# Patient Record
Sex: Female | Born: 1985
Health system: Southern US, Community
[De-identification: ages and names within clinical notes are randomized; demographics above are authoritative.]

## PROBLEM LIST (undated history)

## (undated) ENCOUNTER — Inpatient Hospital Stay (HOSPITAL_COMMUNITY): Payer: Self-pay

## (undated) DIAGNOSIS — O09899 Supervision of other high risk pregnancies, unspecified trimester: Secondary | ICD-10-CM

## (undated) DIAGNOSIS — O24911 Unspecified diabetes mellitus in pregnancy, first trimester: Secondary | ICD-10-CM

## (undated) DIAGNOSIS — Z3490 Encounter for supervision of normal pregnancy, unspecified, unspecified trimester: Secondary | ICD-10-CM

## (undated) HISTORY — DX: Unspecified diabetes mellitus in pregnancy, first trimester: O24.911

## (undated) HISTORY — PX: NO PAST SURGERIES: SHX2092

## (undated) HISTORY — DX: Encounter for supervision of normal pregnancy, unspecified, unspecified trimester: Z34.90

## (undated) HISTORY — DX: Supervision of other high risk pregnancies, unspecified trimester: O09.899

---
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
## 2010-02-24 ENCOUNTER — Other Ambulatory Visit: Admission: RE | Admit: 2010-02-24 | Discharge: 2010-02-24 | Payer: Self-pay | Admitting: Obstetrics and Gynecology

## 2011-02-26 NOTE — H&P (Signed)
NAMEZIARA, THELANDER             ACCOUNT NO.:  [id number]   MEDICAL RECORD NO.:  [id number]          PATIENT TYPE:  EMS   LOCATION:  MAJO                         FACILITY:  [facility]   PHYSICIAN:  Larina Earthly, M.D.        DATE OF BIRTH:  [birth year]   DATE OF ADMISSION:  09/21/2005  DATE OF DISCHARGE:                                HISTORY & PHYSICAL   CHIEF COMPLAINT:  Left hip abscess.   HISTORY OF PRESENT ILLNESS:  This is an 25 year old Caucasian female who has  been diagnosed with type 1 diabetes since the age of 2 and has been fairly  well-controlled.  She has been using an insulin pump since the age of 38.  She is followed by Dr. Tera Mater. [place] on a regular basis and indeed her  last hemoglobin A1c was 7.3% by her report.  She has never had a history of  DKA since diagnosis of type 1 diabetes.  She has only had two significant  lows in the last 15 years.   She did report a pump site infection approximately one month ago and was  seen by Dr. Evlyn Kanner for which oral antibiotics were prescribed for  approximately one week.  Type of antibiotic is not known at this time.  Infection per the patient's report resolved completely.  However, during the  last one week, the patient noted the development of a large abscess forming  on her left hip.  The patient did not seek medical assistance since she was  in the middle of end of semester exams at [employer] at this  time.  She came home today and was observed by her mother who was quite  alarmed at the time by this abscess on the left hip and she was immediately  brought to the emergency room for further evaluation and management.  In the  emergency room, the physician noted a 15 x 20-cm abscess with large blisters  and subsequently incised and drained the abscess of 400 cc of purulent  material, after initial consultation by general surgery.  It was then packed  with a full bottle of iodoform gauze.  Purulent material was sent  for  culture and vancomycin intravenously was ordered for further management.  I  was called to admit the patient.   REVIEW OF SYSTEMS:  Negative for fevers, chills, significant left hip pain,  nausea, vomiting, chest pain, shortness of breath, new neurological deficit  or complications of type 1 diabetes to include diabetic retinopathy,  nephropathy, and neuropathy.  The patient denied any change in bowel habits  or signs and symptoms of a urinary tract infection.   PROBLEM LIST:  1.  Type 1 diabetes since the age of 2.  2.  Seasonal allergic rhinitis, treated with Zyrtec and Clarinex.   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   She has no known drug allergies.   She currently uses:  1.  Clarinex 5  mg in the morning.  2.  Zyrtec 10 mg in the evening.  3.  Short-acting insulin via insulin pump.   LABORATORY EVALUATION:  Culture pending of the purulent material.  White  cell blood count 13.3, hemoglobin 13.3, hematocrit 38.8%, platelet count  469.  I-STAT-8 pending and unavailable at this time in view of the fact that  the computer system in the emergency room is down for probably an hour.  Creatinine 0.8.   PHYSICAL EXAMINATION:  GENERAL:  A pleasant Caucasian female lying in bed,  alert and oriented x3, answering questions appropriately.  VITAL SIGNS:  Blood pressure 144/88, temperature 99 degrees Fahrenheit,  respirations 16 and non labored, pulse 72.  HEENT:  Sclerae anicteric.  Extraocular movements intact.  There are no  oropharyngeal lesions.  NECK:  Supple.  There is no cervical lymphadenopathy.  LUNGS:  Clear to auscultation bilaterally.  CARDIOVASCULAR:  Reveals a regular rate and rhythm with 1/6 systolic murmur.  ABDOMEN:  Soft, nontender, nondistended.  Active bowel sounds are present.  There is no hepatosplenomegaly.   EXTREMITIES:  Reveals no edema.  Pedal pulses are intact.  Examination of  the left hip reveals a 1.5 x 2-cm incision with iodoform packing __________  out with a minor amount of surrounding erythema and no fluctuance at the  site.  NEUROLOGIC:  Grossly nonfocal.   ASSESSMENT/PLAN:  1.  Left hip abscess presumably secondary to previous insulin pump site      infection thought to be healed.  The patient states that previous course      of antibiotics resulted in complete resolution with no tenderness or      skin breakdown.  This is concerning for an underlying abscess.  We will      follow up on cultures and continue intravenous vancomycin and perform      twice a day iodoform dressing changes, and we will consult surgery if      her clinical course warrants this.  2.  Type 1 diabetes mellitus, fairly well-controlled with the last      hemoglobin A1c being 7.3%.  No significant lows or diabetic ketoacidosis      in her past 15 years.  We will allow the patient to self administer      insulin via bolus and continued her normal basal regimen and check CBG      frequently q.a.c. h.s. and treat as the patient warrants.      Larina Earthly, M.D.  Electronically Signed     RA/MEDQ  D:  09/22/2005  T:  09/22/2005  Job:  [id number]

## 2011-02-26 NOTE — Discharge Summary (Signed)
Lindsey [name], Lindsey [name]             ACCOUNT NO.:  [id number]   MEDICAL RECORD NO.:  [id number]          PATIENT TYPE:  INP   LOCATION:  5702                         FACILITY:  [facility]   PHYSICIAN:  Tera Mater. Evlyn Kanner, M.D. DATE OF BIRTH:  [birth year]   DATE OF ADMISSION:  09/21/2005  DATE OF DISCHARGE:  09/24/2005                                 DISCHARGE SUMMARY   DISCHARGE DIAGNOSES:  1.  Large staphylococcus aureus hip abscess at site of insulin pump      insertion site, now drained and improved.  No evidence of methicillin-      resistant Staphylococcus aureus.  2.  Known type 1 diabetes with chronically well-controlled blood sugars.   PROCEDURE:  She had an incision and drainage in the ER by the emergency room  physician.   CONSULTATIONS:  [facility] team with [place].  Daphine Deutscher, MD on the 13th and they have followed her.  The wound care team has  also been following her and the diabetes care team has also been following  her.   [name] is an 25 year old white female with long history of type 1  diabetes dating back to the age of 2 with no known complications.  She has  been managed for quite some time with an insulin pump since the age of 65.  She has always had good control and noted a pump site problem.  Oral  antibiotics for treatment and it seemed to get better, but there was an  abscess that formed after this and appeared to resolve.  She was seen in the  emergency room and a large amount of pus was drained from an abscess of the  left hip.  The patient was covered broadly with vancomycin in case this was  MRSA or other more aggressive bacteria.  She is doing clinically extremely  well with 2 inch incision with packing and has shown increasing improvement  day by day.  She was placed on contact precautions but fortunately this  bacteria has been now shown to be a more standard staph aureus bacteria.  There is no evidence of that this is oxacillin- or  methicillin-resistant.  The patient has not had a clinical toxicity.  She had no fever, chills and  sweats.  Her blood sugar actually is a bit low today at 55 which she  tolerated well.  She seems to be doing well and ready for discharge.  Surgery team felt like p.o. antibiotics for an additional ten days and  keeping the area covered without packing on discharge was the appropriate  plan of action.  They felt this would close in fairly quickly.   LABORATORY DATA:  At presentation, white count was 13,300, hemoglobin 13.3,  MCV 83.2, platelets 469,000.  Repeat on the 14th, white count 6,200,  hemoglobin 11.9, MCV 83.2, platelets 372,000.  Initial chemistry sodium 137,  potassium 3.6, chloride 106, BUN 12, creatinine 0.8, glucose 191.  Repeat on  14th, sodium 141, potassium 3.6, chloride 108, CO2 27, BUN 5, creatinine  0.7, glucose 104, calcium 8.8.  The bacteria was staph aureus  in a moderate  amount.  Its sensitivities were sensitive to gentamicin, to levofloxacin, to  oxacillin, to rifampin, trimethoprim sulfa, vancomycin, tetracycline,  moxifloxacin.  Its only resistances are to erythromycin and penicillin.   In summary, we have an 25 year old white female with longstanding well-  controlled type 1 diabetes presenting  with a pump site infection that  developed into an abscess.  She has done well with incision and drainage,  broad-spectrum antibiotics and local wound care.  She is now ready to be  transitioned to oral antibiotics and I believe we  are going to get by with using Levaquin 750 daily for an additional ten  days.  She should do extremely well with this.  The wound is improving.  She  will resume her diabetes pump settings as before, her diet as before, no  pain management is necessary.  Antibiotics have been called in to [facility]  at [place] at [phone].           ______________________________  Tera Mater. Evlyn Kanner, M.D.     SAS/MEDQ  D:  09/24/2005  T:  09/27/2005   Job:  [id number]   cc:   [place] Daphine Deutscher, MD  [address]  [place]  [place] [address]

## 2011-08-27 DIAGNOSIS — E109 Type 1 diabetes mellitus without complications: Secondary | ICD-10-CM | POA: Insufficient documentation

## 2011-08-27 DIAGNOSIS — E1065 Type 1 diabetes mellitus with hyperglycemia: Secondary | ICD-10-CM | POA: Insufficient documentation

## 2012-01-08 ENCOUNTER — Emergency Department (HOSPITAL_COMMUNITY): Payer: BC Managed Care – PPO

## 2012-01-08 ENCOUNTER — Encounter (HOSPITAL_COMMUNITY): Payer: Self-pay | Admitting: Emergency Medicine

## 2012-01-08 ENCOUNTER — Emergency Department (HOSPITAL_COMMUNITY)
Admission: EM | Admit: 2012-01-08 | Discharge: 2012-01-08 | Disposition: A | Discharge status: Home / Self Care | Payer: BC Managed Care – PPO | Attending: Emergency Medicine | Admitting: Emergency Medicine

## 2012-01-08 DIAGNOSIS — H5710 Ocular pain, unspecified eye: Secondary | ICD-10-CM | POA: Insufficient documentation

## 2012-01-08 DIAGNOSIS — R29898 Other symptoms and signs involving the musculoskeletal system: Secondary | ICD-10-CM | POA: Insufficient documentation

## 2012-01-08 DIAGNOSIS — F29 Unspecified psychosis not due to a substance or known physiological condition: Secondary | ICD-10-CM | POA: Insufficient documentation

## 2012-01-08 DIAGNOSIS — Z794 Long term (current) use of insulin: Secondary | ICD-10-CM | POA: Insufficient documentation

## 2012-01-08 DIAGNOSIS — R109 Unspecified abdominal pain: Secondary | ICD-10-CM | POA: Insufficient documentation

## 2012-01-08 DIAGNOSIS — R209 Unspecified disturbances of skin sensation: Secondary | ICD-10-CM | POA: Insufficient documentation

## 2012-01-08 DIAGNOSIS — R51 Headache: Secondary | ICD-10-CM

## 2012-01-08 DIAGNOSIS — M25519 Pain in unspecified shoulder: Secondary | ICD-10-CM | POA: Insufficient documentation

## 2012-01-08 DIAGNOSIS — E119 Type 2 diabetes mellitus without complications: Secondary | ICD-10-CM | POA: Insufficient documentation

## 2012-01-08 DIAGNOSIS — R202 Paresthesia of skin: Secondary | ICD-10-CM

## 2012-01-08 LAB — DIFFERENTIAL
Basophils Absolute: 0 10*3/uL (ref 0.0–0.1)
Basophils Relative: 0 % (ref 0–1)
Eosinophils Absolute: 0.3 10*3/uL (ref 0.0–0.7)
Eosinophils Relative: 4 % (ref 0–5)
Lymphocytes Relative: 25 % (ref 12–46)
Lymphs Abs: 1.6 10*3/uL (ref 0.7–4.0)
Monocytes Absolute: 0.5 10*3/uL (ref 0.1–1.0)
Monocytes Relative: 7 % (ref 3–12)
Neutro Abs: 4.2 10*3/uL (ref 1.7–7.7)
Neutrophils Relative %: 65 % (ref 43–77)

## 2012-01-08 LAB — POCT I-STAT, CHEM 8
BUN: 5 mg/dL — ABNORMAL LOW (ref 6–23)
Calcium, Ion: 1.24 mmol/L (ref 1.12–1.32)
Chloride: 107 mEq/L (ref 96–112)
Creatinine, Ser: 0.8 mg/dL (ref 0.50–1.10)
Glucose, Bld: 91 mg/dL (ref 70–99)
HCT: 45 % (ref 36.0–46.0)
Hemoglobin: 15.3 g/dL — ABNORMAL HIGH (ref 12.0–15.0)
Potassium: 3.9 mEq/L (ref 3.5–5.1)
Sodium: 143 mEq/L (ref 135–145)
TCO2: 25 mmol/L (ref 0–100)

## 2012-01-08 LAB — CBC
HCT: 41.2 % (ref 36.0–46.0)
Hemoglobin: 13.6 g/dL (ref 12.0–15.0)
MCH: 28.3 pg (ref 26.0–34.0)
MCHC: 33 g/dL (ref 30.0–36.0)
MCV: 85.8 fL (ref 78.0–100.0)
Platelets: 336 10*3/uL (ref 150–400)
RBC: 4.8 MIL/uL (ref 3.87–5.11)
RDW: 12.4 % (ref 11.5–15.5)
WBC: 6.6 10*3/uL (ref 4.0–10.5)

## 2012-01-08 LAB — URINE MICROSCOPIC-ADD ON

## 2012-01-08 LAB — URINALYSIS, ROUTINE W REFLEX MICROSCOPIC
Bilirubin Urine: NEGATIVE
Glucose, UA: NEGATIVE mg/dL
Ketones, ur: NEGATIVE mg/dL
Leukocytes, UA: NEGATIVE
Nitrite: NEGATIVE
Protein, ur: NEGATIVE mg/dL
Specific Gravity, Urine: 1.015 (ref 1.005–1.030)
Urobilinogen, UA: 0.2 mg/dL (ref 0.0–1.0)
pH: 8 (ref 5.0–8.0)

## 2012-01-08 LAB — PREGNANCY, URINE: Preg Test, Ur: NEGATIVE

## 2012-01-08 MED ORDER — TETRACAINE HCL 0.5 % OP SOLN
OPHTHALMIC | Status: AC
  Administered 2012-01-08: 17:00:00
  Filled 2012-01-08: qty 2

## 2012-01-08 MED ORDER — GADOBENATE DIMEGLUMINE 529 MG/ML IV SOLN
17.0000 mL | Freq: Once | INTRAVENOUS | Status: AC | PRN
  Administered 2012-01-08: 17 mL via INTRAVENOUS

## 2012-01-08 NOTE — ED Notes (Signed)
Pt. Stated, when i was trying to cut my movie off and I couldn't get my hand to do what i wanted it to do.  Today I'm having pain behind my rt. Eye and I think i might have had a seizure.

## 2012-01-08 NOTE — ED Provider Notes (Signed)
History     CSN: [id number]  Arrival date & time 01/08/12  1436   First MD Initiated Contact with Patient 01/08/12 1657      Chief Complaint  Patient presents with  . Eye Pain    pain behind rt. eye    (Consider location/radiation/quality/duration/timing/severity/associated sxs/prior treatment) Patient is a 26 y.o. female presenting with eye pain. The history is provided by the patient.  Eye Pain The current episode started today. The problem occurs constantly. The problem has been unchanged. Associated symptoms include headaches, numbness and weakness. Pertinent negatives include no chills or fever. The symptoms are aggravated by nothing. She has tried nothing for the symptoms.  Pt states right eye pressure since this morning. States hurts right behind the eye. No injury to the eye, no visual changes, no sensitivity to light or sound, no eye redness or swelling. States also having some tenderness in right shoulder and pain in right flank. States that last night she had an episode where she fell asleep while watching a move on her tablet in bed. States woke up and tried to turn her tablet off, when she realized she could not move her right arm. States she also felt like she was confused, could not think clear, and possibly couldn't move her right leg, but she is not sure. States she threw the tablet with her left hand and had to "throw" her body over onto right side and then she went back to sleep. This morning when woke up, those symptoms resolved. She denies previous similar episodes, denies recent illnesses.  Past Medical History  Diagnosis Date  . Diabetes mellitus     History reviewed. No pertinent past surgical history.  History reviewed. No pertinent family history.  History  Substance Use Topics  . Smoking status: Never Smoker   . Smokeless tobacco: Not on file  . Alcohol Use: 1.2 oz/week    1 Glasses of wine, 1 Shots of liquor per week    OB History    Grav Para Term  Preterm Abortions TAB SAB Ect Mult Living                  Review of Systems  Constitutional: Negative for fever and chills.  Eyes: Positive for pain. Negative for photophobia, discharge, redness, itching and visual disturbance.  Respiratory: Negative.   Cardiovascular: Negative.   Gastrointestinal: Negative.   Genitourinary: Positive for flank pain. Negative for dysuria and hematuria.  Musculoskeletal: Negative.   Skin: Negative.   Neurological: Positive for weakness, numbness and headaches. Negative for dizziness, syncope, facial asymmetry and speech difficulty.  Psychiatric/Behavioral: Negative.     Allergies  Review of patient's allergies indicates no known allergies.  Home Medications   Current Outpatient Rx  Name Route Sig Dispense Refill  . INSULIN ASPART 100 UNIT/ML [facility] SOLN Subcutaneous Inject 0-10 Units into the skin 3 (three) times daily before meals. With pump/ sugar level minus 100 divided by 40; total carbs divided 11-13 - average 33.5 units/day      BP 119/80  Pulse 75  Temp(Src) 98.8 F (37.1 C) (Oral)  Resp 20  SpO2 100%  LMP 01/01/2012  Physical Exam  Nursing note and vitals reviewed. Constitutional: She is oriented to person, place, and time. She appears well-developed and well-nourished.  HENT:  Head: Normocephalic and atraumatic.  Right Ear: External ear normal.  Left Ear: External ear normal.  Nose: Nose normal.  Mouth/Throat: Oropharynx is clear and moist.  Eyes: EOM are normal. Pupils are  equal, round, and reactive to light. Right eye exhibits no discharge. Left eye exhibits no discharge.  Neck: Normal range of motion. Neck supple.  Cardiovascular: Normal rate, regular rhythm and normal heart sounds.   Pulmonary/Chest: Effort normal and breath sounds normal. No respiratory distress.  Abdominal: Soft. Bowel sounds are normal.  Musculoskeletal: Normal range of motion.  Lymphadenopathy:    She has no cervical adenopathy.  Neurological: She is  alert and oriented to person, place, and time. She displays normal reflexes. No cranial nerve deficit. She exhibits normal muscle tone. Coordination normal.       5/5 and equal upper and lower extremity strength bilaterally. No pronator drift. Normal finger to nose.   Skin: Skin is warm and dry.  Psychiatric: She has a normal mood and affect.    ED Course  Procedures (including critical care time)  Pt with left sided paralysis last night, headache today. Will check istat chem 8, eye pressures, MRI brain to r/o stroke/ tumor.  Results for orders placed during the hospital encounter of 01/08/12  CBC      Component Value Range   WBC 6.6  4.0 - 10.5 (K/uL)   RBC 4.80  3.87 - 5.11 (MIL/uL)   Hemoglobin 13.6  12.0 - 15.0 (g/dL)   HCT 29.5  62.1 - 30.8 (%)   MCV 85.8  78.0 - 100.0 (fL)   MCH 28.3  26.0 - 34.0 (pg)   MCHC 33.0  30.0 - 36.0 (g/dL)   RDW 65.7  84.6 - 96.2 (%)   Platelets 336  150 - 400 (K/uL)  DIFFERENTIAL      Component Value Range   Neutrophils Relative 65  43 - 77 (%)   Neutro Abs 4.2  1.7 - 7.7 (K/uL)   Lymphocytes Relative 25  12 - 46 (%)   Lymphs Abs 1.6  0.7 - 4.0 (K/uL)   Monocytes Relative 7  3 - 12 (%)   Monocytes Absolute 0.5  0.1 - 1.0 (K/uL)   Eosinophils Relative 4  0 - 5 (%)   Eosinophils Absolute 0.3  0.0 - 0.7 (K/uL)   Basophils Relative 0  0 - 1 (%)   Basophils Absolute 0.0  0.0 - 0.1 (K/uL)  URINALYSIS, ROUTINE W REFLEX MICROSCOPIC      Component Value Range   Color, Urine YELLOW  YELLOW    APPearance CLEAR  CLEAR    Specific Gravity, Urine 1.015  1.005 - 1.030    pH 8.0  5.0 - 8.0    Glucose, UA NEGATIVE  NEGATIVE (mg/dL)   Hgb urine dipstick MODERATE (*) NEGATIVE    Bilirubin Urine NEGATIVE  NEGATIVE    Ketones, ur NEGATIVE  NEGATIVE (mg/dL)   Protein, ur NEGATIVE  NEGATIVE (mg/dL)   Urobilinogen, UA 0.2  0.0 - 1.0 (mg/dL)   Nitrite NEGATIVE  NEGATIVE    Leukocytes, UA NEGATIVE  NEGATIVE   PREGNANCY, URINE      Component Value Range    Preg Test, Ur NEGATIVE  NEGATIVE   POCT I-STAT, CHEM 8      Component Value Range   Sodium 143  135 - 145 (mEq/L)   Potassium 3.9  3.5 - 5.1 (mEq/L)   Chloride 107  96 - 112 (mEq/L)   BUN 5 (*) 6 - 23 (mg/dL)   Creatinine, Ser 9.52  0.50 - 1.10 (mg/dL)   Glucose, Bld 91  70 - 99 (mg/dL)   Calcium, Ion 8.41  3.24 - 1.32 (mmol/L)   TCO2  25  0 - 100 (mmol/L)   Hemoglobin 15.3 (*) 12.0 - 15.0 (g/dL)   HCT 86.5  78.4 - 69.6 (%)  URINE MICROSCOPIC-ADD ON      Component Value Range   Squamous Epithelial / LPF FEW (*) RARE    WBC, UA 3-6  <3 (WBC/hpf)   RBC / HPF 3-6  <3 (RBC/hpf)   Bacteria, UA RARE  RARE    Mr Laqueta Jean Wo Contrast  01/08/2012  *RADIOLOGY REPORT*  Clinical Data: 26 year old female with headache and transient right- sided upper extremity weakness. Chronic diabetes.  Insulin pump user.  MRI HEAD WITHOUT AND WITH CONTRAST  Technique:  Multiplanar, multiecho pulse sequences of the brain and surrounding structures were obtained according to standard protocol without and with intravenous contrast  Contrast: 17mL MULTIHANCE GADOBENATE DIMEGLUMINE 529 MG/ML IV SOLN  Comparison: None.  Findings: Normal cerebral volume. No restricted diffusion to suggest acute infarction.  No midline shift, mass effect, evidence of mass lesion, ventriculomegaly, extra-axial collection or acute intracranial hemorrhage.  Cervicomedullary junction and pituitary are within normal limits.  Major intracranial vascular flow voids are preserved. Wallace Cullens and white matter signal is within normal limits throughout the brain.  Negative visualized cervical spine. Visualized bone marrow signal is within normal limits. No abnormal enhancement identified.  Visualized orbit soft tissues are within normal limits.  Mild paranasal sinus mucosal thickening/small mucous retention cyst. Mastoids are clear.  Negative scalp soft tissues.  IMPRESSION: Normal MRI appearance of the brain.  Original Report Authenticated By: Harley Hallmark, M.D.     7:29 PM Work up is negative including MR of the brain. Pt symptom free at present. Her right eye pressure is 18. Will d/c home with follow up with PCP. Possible sleep paralysis? Vs TIA   No diagnosis found.    MDM          Lottie Mussel, PA 01/09/12 [phone]

## 2012-01-08 NOTE — ED Notes (Signed)
Denies any need or question following dc instructions

## 2012-01-08 NOTE — Discharge Instructions (Signed)
Your labs, and MRI were all normal today. Take tylenol or motrin for headache/pain. Follow up with primary care doctor or neurology if symptoms continue. Return if worsening.

## 2012-01-16 NOTE — ED Provider Notes (Signed)
Evaluation and management procedures were performed by the PA/NP/Resident Physician under my supervision/collaboration.   Domenica Weightman D Khamya Topp, MD 01/16/12 1559 

## 2012-06-16 ENCOUNTER — Other Ambulatory Visit (HOSPITAL_COMMUNITY)
Admission: RE | Admit: 2012-06-16 | Discharge: 2012-06-16 | Disposition: A | Discharge status: Home / Self Care | Payer: BC Managed Care – PPO | Source: Ambulatory Visit | Attending: Obstetrics and Gynecology | Admitting: Obstetrics and Gynecology

## 2012-06-16 DIAGNOSIS — Z01419 Encounter for gynecological examination (general) (routine) without abnormal findings: Secondary | ICD-10-CM | POA: Insufficient documentation

## 2012-08-17 DIAGNOSIS — E11649 Type 2 diabetes mellitus with hypoglycemia without coma: Secondary | ICD-10-CM | POA: Insufficient documentation

## 2012-08-17 DIAGNOSIS — Z9641 Presence of insulin pump (external) (internal): Secondary | ICD-10-CM | POA: Insufficient documentation

## 2012-08-17 DIAGNOSIS — O24019 Pre-existing diabetes mellitus, type 1, in pregnancy, unspecified trimester: Secondary | ICD-10-CM | POA: Insufficient documentation

## 2012-08-17 DIAGNOSIS — E109 Type 1 diabetes mellitus without complications: Secondary | ICD-10-CM | POA: Insufficient documentation

## 2012-12-19 ENCOUNTER — Encounter: Payer: Self-pay | Admitting: *Deleted

## 2013-02-08 ENCOUNTER — Other Ambulatory Visit: Payer: Self-pay | Admitting: Adult Health

## 2014-11-16 ENCOUNTER — Encounter: Payer: 59 | Attending: Internal Medicine

## 2014-11-16 DIAGNOSIS — Z713 Dietary counseling and surveillance: Secondary | ICD-10-CM | POA: Insufficient documentation

## 2014-11-16 DIAGNOSIS — E109 Type 1 diabetes mellitus without complications: Secondary | ICD-10-CM | POA: Insufficient documentation

## 2014-11-16 DIAGNOSIS — Z794 Long term (current) use of insulin: Secondary | ICD-10-CM | POA: Insufficient documentation

## 2014-11-20 ENCOUNTER — Encounter: Payer: 59 | Admitting: *Deleted

## 2014-11-20 ENCOUNTER — Encounter: Payer: Self-pay | Admitting: *Deleted

## 2014-11-20 VITALS — Ht 64.0 in | Wt 166.4 lb

## 2014-11-20 DIAGNOSIS — Z713 Dietary counseling and surveillance: Secondary | ICD-10-CM | POA: Diagnosis not present

## 2014-11-20 DIAGNOSIS — E109 Type 1 diabetes mellitus without complications: Secondary | ICD-10-CM | POA: Diagnosis not present

## 2014-11-20 DIAGNOSIS — Z794 Long term (current) use of insulin: Secondary | ICD-10-CM | POA: Diagnosis not present

## 2014-11-20 NOTE — Patient Instructions (Signed)
Plan: Consider using Carb Counting by Food group as back up plan for carb counting when gram information is not available  Consider use of Calorie King APP on your phone as additional source of gram information You have asked me to contact the [facility] Clinical Manager to see what pump and CGM options might be available so you can have a separate device to program boluses without pulling your pump out of your clothing. You will be hearing from her soon.  Continue using the Bolus Wizard to give insulin for meals and correction at each meal. Consider options for increasing your activity level either at home with your family or in classes at work. (there is a shower [facility]

## 2014-11-20 NOTE — Progress Notes (Signed)
Diabetes Self-Management Education  Visit Type:  Initial  Appt. Start Time: 1145 Appt. End Time: 1215  11/20/2014  [name], identified by name and date of birth, is a 29 y.o. female with a diagnosis of Diabetes: Type 1.  Other people present during visit:  Patient   ASSESSMENT  Height  (1.626 m), weight 166 lb 6.4 oz (75.479 kg). Body mass index is 28.55 kg/(m^2).  Initial Visit Information:  Are you currently following a meal plan?: Yes What type of meal plan do you follow?: limited sugar and flour Are you taking your medications as prescribed?: Yes Are you checking your feet?: No        Psychosocial:     Patient Belief/Attitude about Diabetes: Motivated to manage diabetes Self-care barriers: None Other persons present: Patient Patient Concerns: Nutrition/Meal planning, Medication, Other (comment) (insulin pump support) Special Needs: None Learning Readiness: Change in progress  Complications:   Last HgB A1C per patient/outside source: 6.9 % How often do you check your blood sugar?: 3-4 times/day Have you had a dilated eye exam in the past 12 months?: No Have you had a dental exam in the past 12 months?: No  Diet Intake:  Breakfast: Protein Bar, occasionally fresh fruit, coffee with sweetener Snack (morning): occasionally nuts Lunch: varies daily: meat and cheese with fresh ftuit or whatever is available Snack (afternoon): protein bar Dinner: meat, 2 vegetables, occasionally a sweet potato, water OR fast food with chicken tenders and fries, diet Dr. Reino Kent Snack (evening): no Beverage(s): coffee, hot tea, diet soda, water  Exercise:  Exercise: ADL's (plays with kids in yard)  Individualized Plan for Diabetes Self-Management Training:   Learning Objective:  Patient will have a greater understanding of diabetes self-management.  Patient education plan per assessed needs and concerns is to attend individual sessions    Education Topics  Reviewed with Patient Today:    Role of diet in the treatment of diabetes and the relationship between the three main macronutrients and blood glucose level, Carbohydrate counting Helped patient identify appropriate exercises in relation to his/her diabetes, diabetes complications and other health issue. Other (comment) (Reviewed extended bolus options for higher fat meals)       Other (comment) (Provided flyer for DM 1 Support Group)      PATIENTS GOALS/Plan (Developed by the patient):  Nutrition: General guidelines for healthy choices and portions discussed Physical Activity: 15 minutes per day Monitoring : test blood glucose pre and post meals as discussed  Plan:   Patient Instructions  Plan: Consider using Carb Counting by Food group as back up plan for carb counting when gram information is not available  Consider use of Calorie King APP on your phone as additional source of gram information You have asked me to contact the [facility] Clinical Manager to see what pump and CGM options might be available so you can have a separate device to program boluses without pulling your pump out of your clothing. You will be hearing from her soon.  Continue using the Bolus Wizard to give insulin for meals and correction at each meal. Consider options for increasing your activity level either at home with your family or in classes at work. (there is a shower located at the new fitness center at [facility]   Expected Outcomes:  Demonstrated interest in learning. Expect positive outcomes  Education material provided: Meal plan card and Carbohydrate counting sheet, DM 1 Support Group Flyer, [facility] and Medtronic Pump and CGM Packets  If problems or  questions, patient to contact team via:  Phone and Email  Future DSME appointment: PRN

## 2015-01-01 ENCOUNTER — Ambulatory Visit: Payer: Self-pay | Admitting: *Deleted

## 2015-01-01 ENCOUNTER — Encounter: Payer: 59 | Attending: Internal Medicine | Admitting: *Deleted

## 2015-01-01 DIAGNOSIS — Z794 Long term (current) use of insulin: Secondary | ICD-10-CM | POA: Insufficient documentation

## 2015-01-01 DIAGNOSIS — Z713 Dietary counseling and surveillance: Secondary | ICD-10-CM | POA: Insufficient documentation

## 2015-01-01 DIAGNOSIS — E109 Type 1 diabetes mellitus without complications: Secondary | ICD-10-CM | POA: Insufficient documentation

## 2015-01-01 NOTE — Progress Notes (Signed)
CGM Training:  Appt start time: 1000 end time:1130.  Assessment:  Primary concerns today: Patient here for initiation of Medtronic Revel pump with Enlite Continuous Glucose Monitoring.   Medications: see list     Intervention:   Understanding Glucose Sensing Programming Sensor Information  High Glucose: No Alert today  Low Glucose No Alert today   Other settings to be added at follow up visit Starting New Sensor Use of Product  Entering BG, Calibration Technique and Graphs with [employer] Care[employer] Software and Reports Basic Care Troubleshooting Site and Alarms   Follow Up Patient to see MD for insulin dose adjustments and to schedule visit with me for CGM follow up within 2 week(s).

## 2015-01-15 ENCOUNTER — Encounter: Payer: 59 | Attending: Internal Medicine | Admitting: *Deleted

## 2015-01-15 DIAGNOSIS — Z794 Long term (current) use of insulin: Secondary | ICD-10-CM | POA: Diagnosis not present

## 2015-01-15 DIAGNOSIS — Z713 Dietary counseling and surveillance: Secondary | ICD-10-CM | POA: Insufficient documentation

## 2015-01-15 DIAGNOSIS — E109 Type 1 diabetes mellitus without complications: Secondary | ICD-10-CM | POA: Diagnosis not present

## 2015-01-15 NOTE — Patient Instructions (Signed)
Plan: We have adjusted your Basal Rates down overnight to address hypoglycemia overnight and early AM We have reduced your Carb Ratio at [place] for same reason Consider Filling your [place] with 0.3 units when you change out your infusion set each time Consider using Capture Events for Protein Bar and [place] food so we can assess via the reports what will work better for you Consider using Capture Event for Exercise pre and post again to assess on reports

## 2015-01-17 NOTE — Progress Notes (Signed)
CGM Follow Up Visit:  Appt start time: 1000 end time:1100.  Assessment:  Primary concerns today: Patient here for follow up for Continuous Glucose Monitoring. She states she enjoys having the data, she feels she looks at it too much, wants to back off a bit. She does not feel the need to turn on any alerts at this time.  Her MD has given me permission to make insulin pump setting adjustments.  Medications: see list     Intervention:  We reviewed her CareLink Pro Reports today. The excursion at 2 PM on one day was a [place] Meal which we discussed at our meeting today.      She was having frequent hypoglycemia the first week on the CGM so I recommended she decrease her Basal Rates from 24 hour range of 1.10 - 1.40 u/hr to a single basal rate of 1.00 u/hr on 01/09/15 and the hypoglycemia improved. These reports reflect the averages of each week prior to and after this change was made. Today, we noticed that her post supper glucose has been below target ranges so we decreased her ICR at supper from 14 - 16 grams. I also taught her to use Capture Events to document her exercise times, Protein Bar use and [place] Meals so we can determine what Advanced Features might be helpful to control these situations better.  Pump Settings: Date: Current Date: 01/15/15     [facility] in [facility]   Basal Rate: Carb Ratio Sensitivity  Basal Rate: Carb Ratio Sensitivity   MN: 1.00 MN: 12 MN: 45 MN: 1.00 MN: 12 MN: 45    11A: 14 9 P: 50   11A: 14 9 P: 50     5 P: 14    5 P: 12  (-)                                         Follow Up Patient to upload to CareLink as needed and contact me for report review each time.  CGM follow up in 8 week(s).

## 2015-02-27 ENCOUNTER — Ambulatory Visit (INDEPENDENT_AMBULATORY_CARE_PROVIDER_SITE_OTHER): Payer: Self-pay | Admitting: Family Medicine

## 2015-02-27 ENCOUNTER — Ambulatory Visit: Payer: 59 | Admitting: Pharmacist

## 2015-02-27 VITALS — BP 116/68 | Ht 64.0 in | Wt 156.0 lb

## 2015-02-27 DIAGNOSIS — E109 Type 1 diabetes mellitus without complications: Secondary | ICD-10-CM

## 2015-02-27 NOTE — Progress Notes (Signed)
Subjective:  Patient is a 29 yo female with type 1 diabetes who presents today for annual pharmacy consult as part of the employer-sponsored Link to Wellness program. Current diabetes regimen includes Humalog via pump.  ASA, ACEi, and statin are not yet indicated.  Pt demonstrates a good understanding of her insulin regimen.  Most recent MD follow-up was late  Feb 2016. Patient has a pending appt for 3 months. No med changes or major health changes at this time.  Diabetes Assessment:  No changes to diabetes regimen at this time. Patient continues using Medtronic pump and uses an average of humalog 30 units per day.  Patient does maintain good medication compliance. Most recent A1c was 7.2% which is exceeding goal of less than 7%.  Weight has decreased by 40 lb over the past year according to patient report.  Patient did bring meter today and is currently testing 1-6 times per day. Glucose monitoring occurs fasting, before meals, and when symptomatic.  Patient is not currently wearing a CGM but recently purchased the newest medtronic CGM and did wear it for several weeks.  During this time patient was checking glucose often and using CGM to identify trends.  She did appreciate the amount of information she was receiving and felt it helped her better manage her diabetes.   Patient denies signs and symptoms of neuropathy including numbness/tingling/burning and symptoms of foot infection.  Patient is not up to date on eye and dental exam and is aware of the need to schedule an appt.    Lifestyle Assessment:  Diet - Patient feels her diet is under good control, she has lost 40 lbs over previous year as a result of dietary changes and portion control.  Exercise - No routine exercise.  She does [facility].  She is active with her 28 year old child.      Plan and Goals: 1)  Attempt to achieve a reduced A1c by next Link to Wellness visit 2)  Attempt to test glucose  fasting and prior to each meal (3-4 times daily) 3)  Continue to make healthy dietary choices, and attempt to eat more regular meals to avoid overeating 4)  Stay as active as possible, will consider setting an achievable goal at a future date 5)  Find an eye doctor in the area and schedule an exam   Orlin HildingElizabeth Florencia Zaccaro, PharmD [facility]  [phone]

## 2015-02-27 NOTE — Patient Instructions (Signed)
1)  Attempt to achieve a reduced A1c by next Link to Wellness visit 2)  Attempt to test glucose fasting and prior to each meal (3-4 times daily) 3)  Continue to make healthy dietary choices, and attempt to eat more regular meals to avoid overeating 4)  Stay as active as possible, will consider setting an achievable goal at a future date 5)  Find an eye doctor in the area and schedule an exam  Lindsey LundJanet will contact you for follow-up appointment.  It was great to meet you today!

## 2015-03-06 NOTE — Progress Notes (Signed)
ATTENDING PHYSICIAN NOTE: I have reviewed the chart and agree with the plan as detailed above. Sara Neal MD Pager [phone]  

## 2015-03-19 ENCOUNTER — Ambulatory Visit: Payer: 59 | Admitting: *Deleted

## 2015-04-07 ENCOUNTER — Other Ambulatory Visit: Payer: Self-pay

## 2015-06-19 ENCOUNTER — Ambulatory Visit: Payer: 59 | Admitting: *Deleted

## 2015-06-26 ENCOUNTER — Encounter: Payer: 59 | Attending: Internal Medicine | Admitting: *Deleted

## 2015-06-26 ENCOUNTER — Encounter: Payer: Self-pay | Admitting: *Deleted

## 2015-06-26 DIAGNOSIS — E109 Type 1 diabetes mellitus without complications: Secondary | ICD-10-CM | POA: Insufficient documentation

## 2015-06-26 DIAGNOSIS — Z713 Dietary counseling and surveillance: Secondary | ICD-10-CM | POA: Insufficient documentation

## 2015-07-03 NOTE — Progress Notes (Signed)
Diabetes Self-Management Education  Visit Type:  Follow-up  Appt. Start Time: 1600 Appt. End Time: 1700  07/03/2015  [name], identified by name and date of birth, is a 29 y.o. female with a diagnosis of Diabetes:  .   ASSESSMENT  There were no vitals taken for this visit. There is no weight on file to calculate BMI.       Diabetes Self-Management Education - 07/03/15 1447    Psychosocial Assessment   Patient Belief/Attitude about Diabetes Motivated to manage diabetes   Self-care barriers None   Self-management support CDE visits;Doctor's office   Patient Concerns Glycemic Control;Nutrition/Meal planning   Special Needs None   Complications   Last HgB A1C per patient/outside source 8 %   How often do you check your blood sugar? 3-4 times/day   Patient Education   Previous Diabetes Education Yes (please comment)  Lindsey [name]   Monitoring Identified appropriate SMBG and/or A1C goals.   Psychosocial adjustment Role of stress on diabetes;Brainstormed with patient on coping mechanisms for social situations, getting support from significant others, dealing with feelings about diabetes  Informed patient of DM 1 Support Group   Individualized Goals (developed by patient)   Nutrition Adjust meds/carbs with exercise as discussed   Physical Activity Exercise 3-5 times per week   Medications take my medication as prescribed   Monitoring  test blood glucose pre and post meals as discussed   Health Coping Other (comment)  DM 1 Support Group   Patient Self-Evaluation of Goals - Patient rates self as meeting previously set goals (% of time)   Nutrition >75%   Physical Activity 50 - 75 %   Medications >75%   Monitoring 50 - 75 %   Problem Solving 50 - 75 %   Reducing Risk >75%   Health Coping 50 - 75 %   Outcomes   Program Status Not Completed   Subsequent Visit   Since your last visit have you continued or begun to take your medications as prescribed? Yes   Since your  last visit, are you checking your blood glucose at least once a day? Yes      Learning Objective:  Patient will have a greater understanding of diabetes self-management. Patient education plan is to attend individual and/or group sessions per assessed needs and concerns.   Plan:   Patient Instructions  Plan: Continue with Carb Counting for use with insulin pump Continue to SMBG before and occasionally after meals to assess accuracy of insulin sensitivity factor Consider attending DM 1 Support Group to help with coping and social support as well as ongoing education    Expected Outcomes:  Demonstrated interest in learning. Expect positive outcomes  Education material provided: DM 1 Support Group Flyer  If problems or questions, patient to contact team via:  Phone and Email  Future DSME appointment: - 3-4 months

## 2015-07-03 NOTE — Patient Instructions (Signed)
Plan: Continue with Carb Counting for use with insulin pump Continue to SMBG before and occasionally after meals to assess accuracy of insulin sensitivity factor Consider attending DM 1 Support Group to help with coping and social support as well as ongoing education

## 2015-07-29 ENCOUNTER — Ambulatory Visit: Payer: 59 | Admitting: *Deleted

## 2015-08-05 ENCOUNTER — Ambulatory Visit: Payer: 59 | Admitting: *Deleted

## 2015-08-05 ENCOUNTER — Encounter: Payer: 59 | Attending: Internal Medicine | Admitting: *Deleted

## 2015-08-05 DIAGNOSIS — Z713 Dietary counseling and surveillance: Secondary | ICD-10-CM | POA: Insufficient documentation

## 2015-08-05 DIAGNOSIS — E109 Type 1 diabetes mellitus without complications: Secondary | ICD-10-CM | POA: Insufficient documentation

## 2015-08-12 NOTE — Progress Notes (Signed)
Pump Upgrade Training: Appt start time: 1530 end time: 1600.   Assessment: Primary concerns today: Patient here for upgrade to Medtronic 630G with CGM  insulin pump. Patient has or has not completed training modules from [employer]. She has also started wearing this pump. Today we will review her settings and go over Advanced Features   Medications: see list. Insulin used in pump is Humalog  Intervention:  Pump settings transferred directly from current pump to new pump by patient New meter ID added to pump for communicating Reviewed new features of pump with patient Suggested use of CGM, Temp Basal and Extended Bolus features to patient in order to improve overall control Patient  is signed up on [employer] Web Site. She will provide me with her User Name and Password when she gets back home Patient expressed understanding of info taught and is wearing pump by end of visit.  Follow Up   Patient to upload to CareLink within 7 days and notify me to assess for any pump setting changes.  CGM Training:  Appt start time: 1600 end time:1700.  Assessment:  Primary concerns today: Patient here for initiation of Medtronic 630G Continuous Glucose Monitoring.   Medications: see list     Intervention:   Understanding Glucose Sensing Programming Sensor Information  High Glucose: 250 mg/dl during the day and 454300 mg/dl during her sleep time  Low Glucose OFF  Other settings to be added at follow up visit Starting [employer] Sensor Use of Product  Entering BG, Calibration Technique and Graphs with [employer] CareLink Software and Reports Basic [employer] Troubleshooting Site and Alarms  Follow Up Patient to see MD for insulin dose adjustments and to schedule visit with me for CGM follow up within 4 week(s). Patient informed of DM 1 and Pump Support Group meetings the 1st Thursday of every month.

## 2015-08-13 ENCOUNTER — Ambulatory Visit: Payer: 59 | Admitting: *Deleted

## 2015-09-01 ENCOUNTER — Emergency Department (HOSPITAL_COMMUNITY)
Admission: EM | Admit: 2015-09-01 | Discharge: 2015-09-01 | Disposition: A | Discharge status: Home / Self Care | Payer: 59 | Source: Home / Self Care

## 2015-09-01 DIAGNOSIS — H9201 Otalgia, right ear: Secondary | ICD-10-CM | POA: Diagnosis not present

## 2015-09-01 DIAGNOSIS — H671 Otitis media in diseases classified elsewhere, right ear: Secondary | ICD-10-CM

## 2015-09-01 MED ORDER — AMOXICILLIN 500 MG PO CAPS: 500.0000 mg | ORAL_CAPSULE | Freq: Three times a day (TID) | ORAL | Status: DC

## 2015-09-01 NOTE — Discharge Instructions (Signed)
Otitis Media, Adult °Otitis media is redness, soreness, and puffiness (swelling) in the space just behind your eardrum (middle ear). It may be caused by allergies or infection. It often happens along with a cold. °HOME CARE °· Take your medicine as told. Finish it even if you start to feel better. °· Only take over-the-counter or prescription medicines for pain, discomfort, or fever as told by your doctor. °· Follow up with your doctor as told. °GET HELP IF: °· You have otitis media only in one ear, or bleeding from your nose, or both. °· You notice a lump on your neck. °· You are not getting better in 3-5 days. °· You feel worse instead of better. °GET HELP RIGHT AWAY IF:  °· You have pain that is not helped with medicine. °· You have puffiness, redness, or pain around your ear. °· You get a stiff neck. °· You cannot move part of your face (paralysis). °· You notice that the bone behind your ear hurts when you touch it. °MAKE SURE YOU:  °· Understand these instructions. °· Will watch your condition. °· Will get help right away if you are not doing well or get worse. °  °This information is not intended to replace advice given to you by your health care provider. Make sure you discuss any questions you have with your health care provider. °  °Document Released: 03/15/2008 Document Revised: 10/18/2014 Document Reviewed: 04/24/2013 °Elsevier Interactive Patient Education ©2016 Elsevier Inc. ° ° °

## 2015-09-01 NOTE — ED Provider Notes (Signed)
CSN: [id number]     Arrival date & time 09/01/15  1309 History   None    No chief complaint on file. Ear pain (Consider location/radiation/quality/duration/timing/severity/associated sxs/prior Treatment) HPI History obtained from patient:   LOCATION: left ear SEVERITY:6 DURATION:2 days CONTEXT: Sudden onset QUALITY: MODIFYING FACTORS: OTC medications without  ASSOCIATED SYMPTOMS:difficulty eating TIMING:constant OCCUPATION:  Past Medical History  Diagnosis Date  . Diabetes mellitus    No past surgical history on file. No family history on file. Social History  Substance Use Topics  . Smoking status: Never Smoker   . Smokeless tobacco: Not on file  . Alcohol Use: 1.2 oz/week    1 Glasses of wine, 1 Shots of liquor per week   OB History    No data available     Review of Systems ROS +'veright ear pain  Denies: HEADACHE, NAUSEA, ABDOMINAL PAIN, CHEST PAIN, CONGESTION, DYSURIA, SHORTNESS OF BREATH  Allergies  Review of patient's allergies indicates no known allergies.  Home Medications   Prior to Admission medications   Medication Sig Start Date End Date Taking? Authorizing Provider  amoxicillin (AMOXIL) 500 MG capsule Take 1 capsule (500 mg total) by mouth 3 (three) times daily. 09/01/15   Tharon AquasFrank C Devynne Sturdivant, PA  cetirizine (ZYRTEC) 10 MG tablet Take 10 mg by mouth daily.    Historical Provider, MD  insulin lispro (HUMALOG) 100 UNIT/ML injection Inject into the skin. Use up to 100 units per day as directed in insulin pump    Historical Provider, MD  Multiple Vitamins-Minerals (MULTIVITAMIN WITH MINERALS) tablet Take 1 tablet by mouth daily.    Historical Provider, MD   Meds Ordered and Administered this Visit  Medications - No data to display  BP 113/74 mmHg  Pulse 77  Temp(Src) 98.1 F (36.7 C) (Oral)  Resp 14  SpO2 99% No data found.   Physical Exam  Constitutional: She is oriented to person, place, and time. She appears well-developed and  well-nourished.  HENT:  Head: Normocephalic and atraumatic.  Right Ear: Tympanic membrane is injected, erythematous and bulging.  Left Ear: External ear normal.  Mouth/Throat: Oropharynx is clear and moist. No oropharyngeal exudate.  Eyes: Conjunctivae are normal.  Neck: Normal range of motion. Neck supple.  Cardiovascular: Normal rate.   Pulmonary/Chest: Effort normal and breath sounds normal.  Abdominal: Soft.  Lymphadenopathy:    She has no cervical adenopathy.  Neurological: She is alert and oriented to person, place, and time.  Skin: Skin is warm and dry.  Psychiatric: She has a normal mood and affect. Her behavior is normal. Judgment and thought content normal.  Nursing note and vitals reviewed.   ED Course  Procedures (including critical care time)  Labs Review Labs Reviewed - No data to display  Imaging Review No results found.   Visual Acuity Review  Right Eye Distance:   Left Eye Distance:   Bilateral Distance:    Right Eye Near:   Left Eye Near:    Bilateral Near:         MDM   1. Otitis media in diseases classified elsewhere, right ear    Rx for amoxil provided. Tylenol/ibuprofen for discomfort Follow up with PCP if not steadily getting better.     Tharon AquasFrank C [place] Pizzimenti, PA 09/01/15 Ernestina [id number]

## 2015-09-25 ENCOUNTER — Ambulatory Visit: Payer: 59 | Admitting: *Deleted

## 2015-10-15 ENCOUNTER — Encounter: Payer: 59 | Attending: Internal Medicine | Admitting: *Deleted

## 2015-10-15 DIAGNOSIS — E109 Type 1 diabetes mellitus without complications: Secondary | ICD-10-CM | POA: Diagnosis not present

## 2015-10-15 DIAGNOSIS — Z713 Dietary counseling and surveillance: Secondary | ICD-10-CM | POA: Insufficient documentation

## 2015-10-15 MED FILL — CONTOUR NEXT STRIPS: 83 days supply | Qty: 500 | Fill #0

## 2015-10-17 ENCOUNTER — Encounter: Payer: Self-pay | Admitting: *Deleted

## 2015-10-17 NOTE — Patient Instructions (Signed)
Plan: Give yourself credit for all the things in your life that you have under control Assess the other parts of your life that you would like to control better and set reasonable and attainable goals there Consider starting the CGM at supper time on Tuesday nights so you have a plan for that part of your diabetes care.

## 2015-10-21 DIAGNOSIS — Z794 Long term (current) use of insulin: Secondary | ICD-10-CM | POA: Diagnosis not present

## 2015-10-21 DIAGNOSIS — Z9641 Presence of insulin pump (external) (internal): Secondary | ICD-10-CM | POA: Diagnosis not present

## 2015-10-21 DIAGNOSIS — E109 Type 1 diabetes mellitus without complications: Secondary | ICD-10-CM | POA: Diagnosis not present

## 2015-10-27 DIAGNOSIS — E109 Type 1 diabetes mellitus without complications: Secondary | ICD-10-CM | POA: Diagnosis not present

## 2015-11-19 ENCOUNTER — Encounter: Payer: 59 | Attending: Internal Medicine | Admitting: *Deleted

## 2015-11-19 DIAGNOSIS — Z713 Dietary counseling and surveillance: Secondary | ICD-10-CM | POA: Insufficient documentation

## 2015-11-19 DIAGNOSIS — E109 Type 1 diabetes mellitus without complications: Secondary | ICD-10-CM | POA: Insufficient documentation

## 2015-11-27 ENCOUNTER — Encounter: Payer: Self-pay | Admitting: *Deleted

## 2015-11-27 NOTE — Progress Notes (Signed)
Diabetes Self-Management Education  Visit Type:  Follow-up  Appt. Start Time: 1615 Appt. End Time: 1700  11/27/2015  [name], identified by name and date of birth, is a 30 y.o. female with a diagnosis of Diabetes:  .   ASSESSMENT  There were no vitals taken for this visit. There is no weight on file to calculate BMI.       Diabetes Self-Management Education - 11/27/15 1529    Psychosocial Assessment   Patient Belief/Attitude about Diabetes Motivated to manage diabetes   Self-care barriers Other (comment)  Feeling better about her management of her DM today   Self-management support Doctor's office;CDE visits   Patient Concerns Glycemic Control   Special Needs None   Complications   How often do you check your blood sugar? 3-4 times/day   Number of hypoglycemic episodes per month 20   Patient Education   Previous Diabetes Education Yes (please comment)   Medications Other (comment)  Reviewed insulin pump reports today   Monitoring Other (comment)  Reviewed CGM reports today   Personal strategies to promote health Lifestyle issues that need to be addressed for better diabetes care  Feels encouraged now that she has started school towards her Masters in Exercise Physiology   Individualized Goals (developed by patient)   Nutrition Adjust meds/carbs with exercise as discussed   Physical Activity Exercise 3-5 times per week   Medications take my medication as prescribed   Monitoring  test blood glucose pre and post meals as discussed;Other (comment)  Continue with use of CGM   Reducing Risk examine blood glucose patterns   Patient Self-Evaluation of Goals - Patient rates self as meeting previously set goals (% of time)   Nutrition >75%   Physical Activity 50 - 75 %   Medications >75%   Monitoring >75%   Problem Solving >75%   Reducing Risk >75%   Health Coping >75%   Outcomes   Program Status Completed   Subsequent Visit   Since your last visit have you  continued or begun to take your medications as prescribed? Yes   Since your last visit, are you checking your blood glucose at least once a day? Yes     Reviewed blood glucose logs on 11/19/15 via: CareLink and found the following:                Hypoglycemia Hyperglycemia Comments  Overnight Period:  YES  1/14 days  Pre-Meal:    Breakfast  YES BASAL   Lunch      Supper     Post-Meal: Breakfast      Lunch      Supper YES  ICR  Bedtime:       Comments: Feeling better about her DM control and has resumed wearing the CGM. She feels it is working better for her. Her average BG per CareLink Reports is 169 +/- 68 mg/dl, with hypoglycemia overnight one time and a few hypo events after supper. Also observed a rise in her SG pre dawn until about 10 AM.  I suggested an increase in her Basal Rate @ 4 AM to address the rise in her BG pre breakfast. I also suggested a decrease in her ICR at Supper time to address her post supper hypoglycemia.   Learning Objective:  Patient will have a greater understanding of diabetes self-management. Patient education plan is to attend individual and/or group sessions per assessed needs and concerns.  Plan:  Continue to use Bolus Wizard for meal  time and correction insulin doses Continue to use CGM with appropriate alerts to help fine tune control of Diabetes  Follow up: Patient to follow up with MD for further management and contact me with any questions or concerns as needed

## 2016-01-14 DIAGNOSIS — E109 Type 1 diabetes mellitus without complications: Secondary | ICD-10-CM | POA: Diagnosis not present

## 2016-01-14 DIAGNOSIS — Z794 Long term (current) use of insulin: Secondary | ICD-10-CM | POA: Diagnosis not present

## 2016-01-14 DIAGNOSIS — Z9641 Presence of insulin pump (external) (internal): Secondary | ICD-10-CM | POA: Diagnosis not present

## 2016-01-27 DIAGNOSIS — E109 Type 1 diabetes mellitus without complications: Secondary | ICD-10-CM | POA: Diagnosis not present

## 2016-03-15 ENCOUNTER — Ambulatory Visit: Payer: Self-pay | Admitting: Adult Health

## 2016-03-16 ENCOUNTER — Encounter: Payer: Self-pay | Admitting: Adult Health

## 2016-03-16 ENCOUNTER — Ambulatory Visit (INDEPENDENT_AMBULATORY_CARE_PROVIDER_SITE_OTHER): Payer: 59 | Admitting: Adult Health

## 2016-03-16 VITALS — BP 100/70 | HR 66 | Ht 64.0 in | Wt 162.0 lb

## 2016-03-16 DIAGNOSIS — Z3490 Encounter for supervision of normal pregnancy, unspecified, unspecified trimester: Secondary | ICD-10-CM

## 2016-03-16 DIAGNOSIS — O24911 Unspecified diabetes mellitus in pregnancy, first trimester: Secondary | ICD-10-CM

## 2016-03-16 DIAGNOSIS — Z3201 Encounter for pregnancy test, result positive: Secondary | ICD-10-CM | POA: Diagnosis not present

## 2016-03-16 DIAGNOSIS — N926 Irregular menstruation, unspecified: Secondary | ICD-10-CM | POA: Diagnosis not present

## 2016-03-16 DIAGNOSIS — O24913 Unspecified diabetes mellitus in pregnancy, third trimester: Secondary | ICD-10-CM | POA: Insufficient documentation

## 2016-03-16 DIAGNOSIS — E109 Type 1 diabetes mellitus without complications: Secondary | ICD-10-CM

## 2016-03-16 DIAGNOSIS — O3680X0 Pregnancy with inconclusive fetal viability, not applicable or unspecified: Secondary | ICD-10-CM

## 2016-03-16 HISTORY — DX: Unspecified diabetes mellitus in pregnancy, first trimester: O24.911

## 2016-03-16 HISTORY — DX: Encounter for supervision of normal pregnancy, unspecified, unspecified trimester: Z34.90

## 2016-03-16 LAB — POCT URINE PREGNANCY: Preg Test, Ur: POSITIVE — AB

## 2016-03-16 NOTE — Progress Notes (Signed)
Subjective:     Patient ID: Lindsey [name], female   DOB: 1986/07/08, 30 y.o.   MRN: [id number]  HPI Lindsey [name] is a 30 year old white female, married in for UPT, has missed a period and had +HPT, she is diabetic on insulin pump.She has 976 year old daughter at home and is working at [employer] now.   Review of Systems Patient denies any headaches, hearing loss, fatigue, blurred vision, shortness of breath, chest pain, abdominal pain, problems with bowel movements, urination, or intercourse. No joint pain or mood swings.+missed period. Reviewed past medical,surgical, social and family history. Reviewed medications and allergies.     Objective:   Physical Exam BP 100/70 mmHg  Pulse 66  Ht 5\' 4"  (1.626 m)  Wt 162 lb (73.483 kg)  BMI 27.79 kg/m2  LMP 01/30/2016 UPT +, aobut 6+4 weeks, by LMP with EDD 11/05/16, Skin warm and dry. Neck: mid line trachea, normal thyroid, good ROM, no lymphadenopathy noted. Lungs: clear to ausculation bilaterally. Cardiovascular: regular rate and rhythm.Abdomen soft and non tender.    Assessment:     UPT + Pregnant Diabetes, on insulin pump     Plan:     Return in 2 days for dating US Review handout on first trimester

## 2016-03-16 NOTE — Patient Instructions (Signed)
First Trimester of Pregnancy The first trimester of pregnancy is from week 1 until the end of week 12 (months 1 through 3). A week after a sperm fertilizes an egg, the egg will implant on the wall of the uterus. This embryo will begin to develop into a baby. Genes from you and your partner are forming the baby. The female genes determine whether the baby is a boy or a girl. At 6-8 weeks, the eyes and face are formed, and the heartbeat can be seen on ultrasound. At the end of 12 weeks, all the baby's organs are formed.  Now that you are pregnant, you will want to do everything you can to have a healthy baby. Two of the most important things are to get good prenatal care and to follow your health care provider's instructions. Prenatal care is all the medical care you receive before the baby's birth. This care will help prevent, find, and treat any problems during the pregnancy and childbirth. BODY CHANGES Your body goes through many changes during pregnancy. The changes vary from woman to woman.   You may gain or lose a couple of pounds at first.  You may feel sick to your stomach (nauseous) and throw up (vomit). If the vomiting is uncontrollable, call your health care provider.  You may tire easily.  You may develop headaches that can be relieved by medicines approved by your health care provider.  You may urinate more often. Painful urination may mean you have a bladder infection.  You may develop heartburn as a result of your pregnancy.  You may develop constipation because certain hormones are causing the muscles that push waste through your intestines to slow down.  You may develop hemorrhoids or swollen, bulging veins (varicose veins).  Your breasts may begin to grow larger and become tender. Your nipples may stick out more, and the tissue that surrounds them (areola) may become darker.  Your gums may bleed and may be sensitive to brushing and flossing.  Dark spots or blotches (chloasma,  mask of pregnancy) may develop on your face. This will likely fade after the baby is born.  Your menstrual periods will stop.  You may have a loss of appetite.  You may develop cravings for certain kinds of food.  You may have changes in your emotions from day to day, such as being excited to be pregnant or being concerned that something may go wrong with the pregnancy and baby.  You may have more vivid and strange dreams.  You may have changes in your hair. These can include thickening of your hair, rapid growth, and changes in texture. Some women also have hair loss during or after pregnancy, or hair that feels dry or thin. Your hair will most likely return to normal after your baby is born. WHAT TO EXPECT AT YOUR PRENATAL VISITS During a routine prenatal visit:  You will be weighed to make sure you and the baby are growing normally.  Your blood pressure will be taken.  Your abdomen will be measured to track your baby's growth.  The fetal heartbeat will be listened to starting around week 10 or 12 of your pregnancy.  Test results from any previous visits will be discussed. Your health care provider may ask you:  How you are feeling.  If you are feeling the baby move.  If you have had any abnormal symptoms, such as leaking fluid, bleeding, severe headaches, or abdominal cramping.  If you are using any tobacco products,   including cigarettes, chewing tobacco, and electronic cigarettes.  If you have any questions. Other tests that may be performed during your first trimester include:  Blood tests to find your blood type and to check for the presence of any previous infections. They will also be used to check for low iron levels (anemia) and Rh antibodies. Later in the pregnancy, blood tests for diabetes will be done along with other tests if problems develop.  Urine tests to check for infections, diabetes, or protein in the urine.  An ultrasound to confirm the proper growth  and development of the baby.  An amniocentesis to check for possible genetic problems.  Fetal screens for spina bifida and Down syndrome.  You may need other tests to make sure you and the baby are doing well.  HIV (human immunodeficiency virus) testing. Routine prenatal testing includes screening for HIV, unless you choose not to have this test. HOME CARE INSTRUCTIONS  Medicines  Follow your health care provider's instructions regarding medicine use. Specific medicines may be either safe or unsafe to take during pregnancy.  Take your prenatal vitamins as directed.  If you develop constipation, try taking a stool softener if your health care provider approves. Diet  Eat regular, well-balanced meals. Choose a variety of foods, such as meat or vegetable-based protein, fish, milk and low-fat dairy products, vegetables, fruits, and whole grain breads and cereals. Your health care provider will help you determine the amount of weight gain that is right for you.  Avoid raw meat and uncooked cheese. These carry germs that can cause birth defects in the baby.  Eating four or five small meals rather than three large meals a day may help relieve nausea and vomiting. If you start to feel nauseous, eating a few soda crackers can be helpful. Drinking liquids between meals instead of during meals also seems to help nausea and vomiting.  If you develop constipation, eat more high-fiber foods, such as fresh vegetables or fruit and whole grains. Drink enough fluids to keep your urine clear or pale yellow. Activity and Exercise  Exercise only as directed by your health care provider. Exercising will help you:  Control your weight.  Stay in shape.  Be prepared for labor and delivery.  Experiencing pain or cramping in the lower abdomen or low back is a good sign that you should stop exercising. Check with your health care provider before continuing normal exercises.  Try to avoid standing for long  periods of time. Move your legs often if you must stand in one place for a long time.  Avoid heavy lifting.  Wear low-heeled shoes, and practice good posture.  You may continue to have sex unless your health care provider directs you otherwise. Relief of Pain or Discomfort  Wear a good support bra for breast tenderness.   Take warm sitz baths to soothe any pain or discomfort caused by hemorrhoids. Use hemorrhoid cream if your health care provider approves.   Rest with your legs elevated if you have leg cramps or low back pain.  If you develop varicose veins in your legs, wear support hose. Elevate your feet for 15 minutes, 3-4 times a day. Limit salt in your diet. Prenatal Care  Schedule your prenatal visits by the twelfth week of pregnancy. They are usually scheduled monthly at first, then more often in the last 2 months before delivery.  Write down your questions. Take them to your prenatal visits.  Keep all your prenatal visits as directed by your   health care provider. Safety  Wear your seat belt at all times when driving.  Make a list of emergency phone numbers, including numbers for family, friends, the hospital, and police and fire departments. General Tips  Ask your health care provider for a referral to a local prenatal education class. Begin classes no later than at the beginning of month 6 of your pregnancy.  Ask for help if you have counseling or nutritional needs during pregnancy. Your health care provider can offer advice or refer you to specialists for help with various needs.  Do not use hot tubs, steam rooms, or saunas.  Do not douche or use tampons or scented sanitary pads.  Do not cross your legs for long periods of time.  Avoid cat litter boxes and soil used by cats. These carry germs that can cause birth defects in the baby and possibly loss of the fetus by miscarriage or stillbirth.  Avoid all smoking, herbs, alcohol, and medicines not prescribed by  your health care provider. Chemicals in these affect the formation and growth of the baby.  Do not use any tobacco products, including cigarettes, chewing tobacco, and electronic cigarettes. If you need help quitting, ask your health care provider. You may receive counseling support and other resources to help you quit.  Schedule a dentist appointment. At home, brush your teeth with a soft toothbrush and be gentle when you floss. SEEK MEDICAL CARE IF:   You have dizziness.  You have mild pelvic cramps, pelvic pressure, or nagging pain in the abdominal area.  You have persistent nausea, vomiting, or diarrhea.  You have a bad smelling vaginal discharge.  You have pain with urination.  You notice increased swelling in your face, hands, legs, or ankles. SEEK IMMEDIATE MEDICAL CARE IF:   You have a fever.  You are leaking fluid from your vagina.  You have spotting or bleeding from your vagina.  You have severe abdominal cramping or pain.  You have rapid weight gain or loss.  You vomit blood or material that looks like coffee grounds.  You are exposed to [place] measles and have never had them.  You are exposed to fifth disease or chickenpox.  You develop a severe headache.  You have shortness of breath.  You have any kind of trauma, such as from a fall or a car accident.   This information is not intended to replace advice given to you by your health care provider. Make sure you discuss any questions you have with your health care provider.   Document Released: 09/21/2001 Document Revised: 10/18/2014 Document Reviewed: 08/07/2013 Elsevier Interactive Patient Education [employer]2016 Elsevier Inc. Return in 2 days for dating [place]

## 2016-03-18 ENCOUNTER — Ambulatory Visit (INDEPENDENT_AMBULATORY_CARE_PROVIDER_SITE_OTHER): Payer: 59

## 2016-03-18 DIAGNOSIS — Z3A01 Less than 8 weeks gestation of pregnancy: Secondary | ICD-10-CM

## 2016-03-18 DIAGNOSIS — O3680X0 Pregnancy with inconclusive fetal viability, not applicable or unspecified: Secondary | ICD-10-CM

## 2016-03-18 NOTE — Progress Notes (Signed)
US 6+6 wks,single IUP w/ys,pos fht 133 bpm,normal ov's bilat,subchorionic hemorrhage 2.9 x.6 x 1.5cm

## 2016-03-24 DIAGNOSIS — O24011 Pre-existing diabetes mellitus, type 1, in pregnancy, first trimester: Secondary | ICD-10-CM | POA: Diagnosis not present

## 2016-03-24 DIAGNOSIS — O24013 Pre-existing diabetes mellitus, type 1, in pregnancy, third trimester: Secondary | ICD-10-CM | POA: Insufficient documentation

## 2016-03-31 ENCOUNTER — Other Ambulatory Visit (HOSPITAL_COMMUNITY)
Admission: RE | Admit: 2016-03-31 | Discharge: 2016-03-31 | Disposition: A | Discharge status: Home / Self Care | Payer: 59 | Source: Ambulatory Visit | Attending: Adult Health | Admitting: Adult Health

## 2016-03-31 ENCOUNTER — Ambulatory Visit (INDEPENDENT_AMBULATORY_CARE_PROVIDER_SITE_OTHER): Payer: 59 | Admitting: Adult Health

## 2016-03-31 ENCOUNTER — Encounter: Payer: Self-pay | Admitting: Adult Health

## 2016-03-31 VITALS — BP 98/62 | HR 84 | Wt 163.5 lb

## 2016-03-31 DIAGNOSIS — Z36 Encounter for antenatal screening of mother: Secondary | ICD-10-CM | POA: Diagnosis not present

## 2016-03-31 DIAGNOSIS — Z331 Pregnant state, incidental: Secondary | ICD-10-CM

## 2016-03-31 DIAGNOSIS — O24911 Unspecified diabetes mellitus in pregnancy, first trimester: Secondary | ICD-10-CM

## 2016-03-31 DIAGNOSIS — Z1151 Encounter for screening for human papillomavirus (HPV): Secondary | ICD-10-CM | POA: Insufficient documentation

## 2016-03-31 DIAGNOSIS — O09899 Supervision of other high risk pregnancies, unspecified trimester: Secondary | ICD-10-CM

## 2016-03-31 DIAGNOSIS — Z113 Encounter for screening for infections with a predominantly sexual mode of transmission: Secondary | ICD-10-CM | POA: Insufficient documentation

## 2016-03-31 DIAGNOSIS — Z01419 Encounter for gynecological examination (general) (routine) without abnormal findings: Secondary | ICD-10-CM | POA: Diagnosis not present

## 2016-03-31 DIAGNOSIS — O0993 Supervision of high risk pregnancy, unspecified, third trimester: Secondary | ICD-10-CM | POA: Insufficient documentation

## 2016-03-31 DIAGNOSIS — Z369 Encounter for antenatal screening, unspecified: Secondary | ICD-10-CM

## 2016-03-31 DIAGNOSIS — Z1389 Encounter for screening for other disorder: Secondary | ICD-10-CM

## 2016-03-31 DIAGNOSIS — O24311 Unspecified pre-existing diabetes mellitus in pregnancy, first trimester: Secondary | ICD-10-CM

## 2016-03-31 DIAGNOSIS — Z3A10 10 weeks gestation of pregnancy: Secondary | ICD-10-CM | POA: Diagnosis not present

## 2016-03-31 DIAGNOSIS — Z3481 Encounter for supervision of other normal pregnancy, first trimester: Secondary | ICD-10-CM | POA: Diagnosis not present

## 2016-03-31 DIAGNOSIS — O09891 Supervision of other high risk pregnancies, first trimester: Secondary | ICD-10-CM

## 2016-03-31 DIAGNOSIS — Z0283 Encounter for blood-alcohol and blood-drug test: Secondary | ICD-10-CM

## 2016-03-31 DIAGNOSIS — Z0189 Encounter for other specified special examinations: Secondary | ICD-10-CM | POA: Diagnosis not present

## 2016-03-31 HISTORY — DX: Supervision of other high risk pregnancies, unspecified trimester: O09.899

## 2016-03-31 LAB — POCT URINALYSIS DIPSTICK
Blood, UA: NEGATIVE
Glucose, UA: NEGATIVE
Ketones, UA: NEGATIVE
Leukocytes, UA: NEGATIVE
Nitrite, UA: NEGATIVE
Protein, UA: NEGATIVE

## 2016-03-31 NOTE — Progress Notes (Signed)
Subjective:  Early CharsJessica P [name] is a 30 y.o. G2P1 Caucasian female at 4646w5d by LMP and US being seen today for her first obstetrical visit.  Her obstetrical history is significant for Class D diabetic on insulin pump, was induced 5 days early with last pregnancy, she weighed 6lbs and 10 oz..  Pregnancy history fully reviewed.  Patient reports nausea.Had mild cramps about 3 weeks ago. Denies vb,  uti s/s, abnormal/malodorous vag d/c, or vulvovaginal itching/irritation.  BP 98/62 mmHg  Pulse 84  Wt 163 lb 8 oz (74.163 kg)  LMP 01/30/2016 (Exact Date)  HISTORY: OB History  Gravida Para Term Preterm AB SAB TAB Ectopic Multiple Living  2 1        1     # Outcome Date GA Lbr Len/2nd Weight Sex Delivery Anes PTL Lv  2 Current           1 Para             Obstetric Comments  Diabetic on insulin pump---------class D   Past Medical History  Diagnosis Date  . Diabetes mellitus   . Pregnant 03/16/2016  . Diabetes mellitus affecting pregnancy in first trimester 03/16/2016  . Supervision of other high-risk pregnancy 03/31/2016     [facility] Initiated Care at   8+5 weeks FOB  Hassel NethColby Mozley 30 yo wm second child Dating By  LMP and US Pap  03/31/16 GC/CT Initial:                36+wks: Genetic Screen NT/IT:  CF screen  Anatomic US  Flu vaccine  Tdap Recommended ~ 28wks Glucose Screen  2 hr GBS  Feed Preference  Contraception  Circumcision  Childbirth Classes  Pediatrician     History reviewed. No pertinent past surgical history. Family History  Problem Relation Age of Onset  . Hypertension Father   . Cancer Maternal Grandmother   . Cancer Maternal Grandfather   . Alzheimer's disease Paternal Grandfather     Exam   System:     General: Well developed & nourished, no acute distress   Skin: Warm & dry, normal coloration and turgor, no rashes   Neurologic: Alert & oriented, normal mood   Cardiovascular: Regular rate & rhythm   Respiratory: Effort & rate normal, LCTAB, acyanotic   Abdomen:  Soft, non tender   Extremities: normal strength, tone   Pelvic Exam:    Perineum: Normal perineum   Vulva: Normal, no lesions   Vagina:  Normal mucosa, normal discharge   Cervix: Normal, bulbous, appears closed friable    Uterus: Normal size/shape/contour for GA   Thin prep pap smear with GC/CHL performed  FHR: 176 via US   Assessment:   Pregnancy: G2P1 Patient Active Problem List   Diagnosis Date Noted  . Supervision of other high-risk pregnancy 03/31/2016  . Pregnant 03/16/2016  . Diabetes mellitus affecting pregnancy in first trimester 03/16/2016    846w5d G2P1 New OB visit     Plan:  Initial labs drawn Continue prenatal vitamins Problem list reviewed and updated Reviewed n/v relief measures and warning s/s to report Reviewed recommended weight gain based on pre-gravid BMI Encouraged well-balanced diet Genetic Screening discussed Integrated Screen: declined Cystic fibrosis screening discussed declined Ultrasound discussed; fetal survey: requested Follow up in 1 weeks for HR OB visit with Dr Despina HiddenEure A1c was 7.2 6 weeks ago.  Lindsey PotterJennifer A. Jackye Dever, NP 03/31/2016 2:41 PM

## 2016-03-31 NOTE — Patient Instructions (Signed)
Follow up with Dr Despina HiddenEure in 1 week

## 2016-04-01 LAB — URINALYSIS, ROUTINE W REFLEX MICROSCOPIC
Bilirubin, UA: NEGATIVE
Glucose, UA: NEGATIVE
Ketones, UA: NEGATIVE
Leukocytes, UA: NEGATIVE
Nitrite, UA: NEGATIVE
Protein, UA: NEGATIVE
RBC, UA: NEGATIVE
Specific Gravity, UA: >=1.03 — AB (ref 1.005–1.030)
Urobilinogen, Ur: 0.2 mg/dL (ref 0.2–1.0)
pH, UA: 6 (ref 5.0–7.5)

## 2016-04-01 LAB — PMP SCREEN PROFILE (10S), URINE
Amphetamine Screen, Ur: NEGATIVE ng/mL
Barbiturate Screen, Ur: NEGATIVE ng/mL
Benzodiazepine Screen, Urine: NEGATIVE ng/mL
Cannabinoids Ur Ql Scn: NEGATIVE ng/mL
Cocaine(Metab.)Screen, Urine: NEGATIVE ng/mL
Creatinine(Crt), U: 208.7 mg/dL (ref 20.0–300.0)
Methadone Scn, Ur: NEGATIVE ng/mL
Opiate Scrn, Ur: NEGATIVE ng/mL
Oxycodone+Oxymorphone Ur Ql Scn: NEGATIVE ng/mL
PCP Scrn, Ur: NEGATIVE ng/mL
Ph of Urine: 5.9 (ref 4.5–8.9)
Propoxyphene, Screen: NEGATIVE ng/mL

## 2016-04-01 LAB — CBC
Hematocrit: 38 % (ref 34.0–46.6)
Hemoglobin: 12.6 g/dL (ref 11.1–15.9)
MCH: 29.4 pg (ref 26.6–33.0)
MCHC: 33.2 g/dL (ref 31.5–35.7)
MCV: 89 fL (ref 79–97)
Platelets: 352 10*3/uL (ref 150–379)
RBC: 4.28 x10E6/uL (ref 3.77–5.28)
RDW: 13.1 % (ref 12.3–15.4)
WBC: 11.4 10*3/uL — ABNORMAL HIGH (ref 3.4–10.8)

## 2016-04-01 LAB — HIV ANTIBODY (ROUTINE TESTING W REFLEX): HIV Screen 4th Generation wRfx: NONREACTIVE

## 2016-04-01 LAB — HEPATITIS B SURFACE ANTIGEN: Hepatitis B Surface Ag: NEGATIVE

## 2016-04-01 LAB — ABO/RH: Rh Factor: POSITIVE

## 2016-04-01 LAB — ANTIBODY SCREEN: Antibody Screen: NEGATIVE

## 2016-04-01 LAB — VARICELLA ZOSTER ANTIBODY, IGG: Varicella zoster IgG: 1017 index (ref >165)

## 2016-04-01 LAB — RPR: RPR Ser Ql: NONREACTIVE

## 2016-04-01 LAB — RUBELLA SCREEN: Rubella Antibodies, IGG: <0.9 index — ABNORMAL LOW (ref >0.99)

## 2016-04-02 ENCOUNTER — Encounter: Payer: Self-pay | Admitting: Adult Health

## 2016-04-02 LAB — CYTOLOGY - PAP

## 2016-04-02 LAB — URINE CULTURE

## 2016-04-06 DIAGNOSIS — E1065 Type 1 diabetes mellitus with hyperglycemia: Secondary | ICD-10-CM | POA: Diagnosis not present

## 2016-04-07 ENCOUNTER — Ambulatory Visit (INDEPENDENT_AMBULATORY_CARE_PROVIDER_SITE_OTHER): Payer: 59 | Admitting: Obstetrics & Gynecology

## 2016-04-07 VITALS — BP 118/68 | HR 84 | Wt 165.0 lb

## 2016-04-07 DIAGNOSIS — O24911 Unspecified diabetes mellitus in pregnancy, first trimester: Secondary | ICD-10-CM

## 2016-04-07 DIAGNOSIS — Z1389 Encounter for screening for other disorder: Secondary | ICD-10-CM

## 2016-04-07 DIAGNOSIS — Z331 Pregnant state, incidental: Secondary | ICD-10-CM

## 2016-04-07 DIAGNOSIS — O24311 Unspecified pre-existing diabetes mellitus in pregnancy, first trimester: Secondary | ICD-10-CM

## 2016-04-07 DIAGNOSIS — Z3A10 10 weeks gestation of pregnancy: Secondary | ICD-10-CM

## 2016-04-07 DIAGNOSIS — O09891 Supervision of other high risk pregnancies, first trimester: Secondary | ICD-10-CM

## 2016-04-07 LAB — POCT URINALYSIS DIPSTICK
Blood, UA: NEGATIVE
Glucose, UA: NEGATIVE
Ketones, UA: NEGATIVE
Leukocytes, UA: NEGATIVE
Nitrite, UA: NEGATIVE
Protein, UA: NEGATIVE

## 2016-04-07 NOTE — Progress Notes (Signed)
Fetal Surveillance Testing today:  FHR 171   High Risk Pregnancy Diagnosis(es):   Class D DM  G2P1 6612w5d Estimated Date of Delivery: 11/05/16  Blood pressure 118/68, pulse 84, weight 165 lb (74.844 kg), last menstrual period 01/30/2016.  Urinalysis: Negative   HPI: The patient is being seen today for ongoing management of Class D DM. Today she reports CBG over all are really good, not perfect   BP weight and urine results all reviewed and noted. Patient reports good fetal movement, denies any bleeding and no rupture of membranes symptoms or regular contractions.  Fundal Height:  na Fetal Heart rate:  171 Edema:    Patient is without complaints other than noted in her HPI. All questions were answered.  All lab and sonogram results have been reviewed. Comments:    Assessment:  1.  Pregnancy at 6712w5d,  Estimated Date of Delivery: 11/05/16 :                          2.  Class D DM, under excellent control                        3.    Medication(s) Plans:  Continue current pump settings, all are between 0.9-1.05  Treatment Plan:  NT 2 weeks + IT  No Follow-up on file. for appointment for high risk OB care  No orders of the defined types were placed in this encounter.   Orders Placed This Encounter  Procedures  . POCT urinalysis dipstick

## 2016-04-12 DIAGNOSIS — E109 Type 1 diabetes mellitus without complications: Secondary | ICD-10-CM | POA: Diagnosis not present

## 2016-04-12 DIAGNOSIS — O24011 Pre-existing diabetes mellitus, type 1, in pregnancy, first trimester: Secondary | ICD-10-CM | POA: Diagnosis not present

## 2016-04-12 DIAGNOSIS — E10649 Type 1 diabetes mellitus with hypoglycemia without coma: Secondary | ICD-10-CM | POA: Diagnosis not present

## 2016-04-12 DIAGNOSIS — Z9641 Presence of insulin pump (external) (internal): Secondary | ICD-10-CM | POA: Insufficient documentation

## 2016-04-23 ENCOUNTER — Other Ambulatory Visit: Payer: 59

## 2016-04-23 ENCOUNTER — Ambulatory Visit: Payer: 59 | Admitting: Obstetrics and Gynecology

## 2016-04-23 ENCOUNTER — Ambulatory Visit (INDEPENDENT_AMBULATORY_CARE_PROVIDER_SITE_OTHER): Payer: 59

## 2016-04-23 DIAGNOSIS — Z3A12 12 weeks gestation of pregnancy: Secondary | ICD-10-CM | POA: Diagnosis not present

## 2016-04-23 DIAGNOSIS — O09891 Supervision of other high risk pregnancies, first trimester: Secondary | ICD-10-CM

## 2016-04-23 DIAGNOSIS — Z36 Encounter for antenatal screening of mother: Secondary | ICD-10-CM | POA: Diagnosis not present

## 2016-04-23 DIAGNOSIS — Z369 Encounter for antenatal screening, unspecified: Secondary | ICD-10-CM

## 2016-04-23 DIAGNOSIS — O24311 Unspecified pre-existing diabetes mellitus in pregnancy, first trimester: Secondary | ICD-10-CM | POA: Diagnosis not present

## 2016-04-23 DIAGNOSIS — O24911 Unspecified diabetes mellitus in pregnancy, first trimester: Secondary | ICD-10-CM

## 2016-04-23 NOTE — Progress Notes (Signed)
US 12 wks,NT 1.2 mm,NB present,crl 58.4 mm,normal ov's bilat,ant pl gr 0,fhr 164 bpm

## 2016-04-26 ENCOUNTER — Other Ambulatory Visit: Payer: Self-pay | Admitting: *Deleted

## 2016-04-26 ENCOUNTER — Encounter: Payer: Self-pay | Admitting: *Deleted

## 2016-04-26 NOTE — Patient Outreach (Signed)
[facility]) Care Management   04/26/2016  Lindsey [name] July 30, 1986 [id number]  Lindsey [name] is an 30 y.o. female who presents to the [facility] [employer] Care Management office for routine Link To Wellness follow up for self management assistance with Type I DM during pregnancy.  Subjective: Lindsey [name] says she feels fine except for fatigue due to first trimester pregnancy. She says she is now wearing the 670 G Medtronic closed loop system but must keep it in manual mode while she is pregnant. She says she sees her OB MD every 2-3 weeks. She reported her most recent Hgb A1C as 7.1% on 03/24/16. She denies severe hypoglycemia. She reports her due date as 11/05/16. She is asking how she enrolls in the Healthy Pregnancy Program.  She also has questions about whether she can get all of her DM supplies through [place], including her insulin and testing supplies, and she has questions about a bill she received for urine drug testing that was done at her OB MD's office.   Objective:   Review of Systems  Constitutional: Negative.     Physical Exam  Constitutional: She is oriented to person, place, and time. She appears well-developed and well-nourished.  Respiratory: Effort normal.  Neurological: She is alert and oriented to person, place, and time.  Skin: Skin is warm and dry.  Psychiatric: She has a normal mood and affect. Her behavior is normal. Judgment and thought content normal.   Filed Vitals:   04/26/16 1444  BP: 108/60   Encounter Medications:   Outpatient Encounter Prescriptions as of 04/26/2016  Medication Sig  . cetirizine (ZYRTEC) 10 MG tablet Take 10 mg by mouth daily.  . insulin lispro (HUMALOG) 100 UNIT/ML injection Inject into the skin. Use up to 100 units per day as directed in insulin pump  . Prenatal Vit-Fe Fumarate-FA (MULTIVITAMIN-PRENATAL) 27-0.8 MG TABS tablet Take 1 tablet by mouth daily at 12 noon.   No facility-administered  encounter medications on file as of 04/26/2016.    Functional Status:   In your present state of health, do you have any difficulty performing the following activities: 04/26/2016  Hearing? N  Vision? N  Difficulty concentrating or making decisions? N  Walking or climbing stairs? N  Dressing or bathing? N  Doing errands, shopping? N  Preparing Food and eating ? N  Using the Toilet? N  In the past six months, have you accidently leaked urine? N  Do you have problems with loss of bowel control? N  Managing your Medications? N  Managing your Finances? N  Housekeeping or managing your Housekeeping? N    Fall/Depression Screening:    PHQ 2/9 Scores 04/26/2016 11/27/2015 10/17/2015 06/26/2015 11/20/2014  PHQ - 2 Score 0 0 0 0 0    Assessment:   [employer] employee and Link To Wellness member with Type I DM, on pump and continuous glucose monitor therapy,  and in first trimester of pregnancy with most recent Hgb A1C= 7.1%. Also has slightly elevated LDL of 103 on lipid profile done 01/27/16 .   Plan:  [facility] CM Care Plan Problem One        Most Recent Value   Care Plan Problem One  Type I DM and pregnant , in first trimester, with most recent Hgb A1C= 7.1% on 03/24/16, also slightly elevated LDL of 103 per lipid panel done 01/27/16   Role Documenting the Problem One  Care Management Coordinator   Care Plan for Problem One  Active   [facility] Long Term Goal (31-90 days)  Improved glycemic control during pregnancy as evidenced by >75% of self monitored CBGs meeting target and Hgb A1C <7.0% at next assessment with no incidences of severe hypoglycemia   [facility] Long Term Goal Start Date  04/26/16   Interventions for Problem One Long Term Goal  reviewed pump settings, frequency of endocrinology visits, results of CBGs, discussed frequency and severity of hypoglycemia, reviewed CBG and Hgb A1C targets for Type I DM during pregnancy, reviewed  weight gain guidelines during pregnancy per 2017 American Diabetes  Association Standards of Care, reviewed lipid panel results of 01/27/16 and provided handout with life style strategies to lower LDL, discussed [place] Healthy Pregnancy Program and notified care management assistant to contact Lindsey [name] to assist her with enrollment, e-mailed Lindsey [name] the link to enable her to enroll in the Healthy Pregnant Program online, e-mailed Lindsey [name] the 2017 American Diabetes Standards of Care and asked her to review Standard 13 related to the Management of Diabetes in Pregnancy, emailed Lindsey [name] a document on DKA to read since she is at increased risk while pregnant, contacted Lindsey [name] in [employer] about the urine drug screen bill and Lindsey [name] at the [facility] to assist with Lindsey [name]'s questions about getting all of her supplies from  [facility] and e-mailed Lindsey [name] their answers to her questions, will arrange Link To Wellness follow up as  needed since she is being seen by her OB MD every 3 weeks      RNCM to fax today's office visit note to Dr. Dorene ArAdcock and Dr. Despina HiddenEure. RNCM will meet as needed with patient to assist with Type I DM self-management and assess patient's progress toward mutually set goals. Bary RichardJanet S. [place] Delauder RN,CCM,CDE [facility] Care Management Coordinator Link To Wellness Office Phone [phone] Office Fax [phone]

## 2016-04-27 MED FILL — CONTOUR NEXT STRIPS: 90 days supply | Qty: 600 | Fill #0

## 2016-04-27 MED FILL — HumaLOG 100 UNIT/ML SOLN: 100 | 90 days supply | Qty: 90 | Fill #0

## 2016-04-28 LAB — MATERNAL SCREEN, INTEGRATED #1
Crown Rump Length: 58.4 mm
Gest. Age on Collection Date: 12.3 weeks
Maternal Age at EDD: 29.9 years
Nuchal Translucency (NT): 1.2 mm
Number of Fetuses: 1
PAPP-A Value: 361.6 ng/mL
Weight: 167 [lb_av]

## 2016-05-06 ENCOUNTER — Encounter: Payer: Self-pay | Admitting: Obstetrics & Gynecology

## 2016-05-06 ENCOUNTER — Ambulatory Visit (INDEPENDENT_AMBULATORY_CARE_PROVIDER_SITE_OTHER): Payer: 59 | Admitting: Obstetrics & Gynecology

## 2016-05-06 VITALS — BP 110/60 | HR 76 | Wt 166.0 lb

## 2016-05-06 DIAGNOSIS — O24312 Unspecified pre-existing diabetes mellitus in pregnancy, second trimester: Secondary | ICD-10-CM

## 2016-05-06 DIAGNOSIS — Z1389 Encounter for screening for other disorder: Secondary | ICD-10-CM

## 2016-05-06 DIAGNOSIS — O24912 Unspecified diabetes mellitus in pregnancy, second trimester: Secondary | ICD-10-CM

## 2016-05-06 DIAGNOSIS — Z3A14 14 weeks gestation of pregnancy: Secondary | ICD-10-CM

## 2016-05-06 DIAGNOSIS — O09892 Supervision of other high risk pregnancies, second trimester: Secondary | ICD-10-CM

## 2016-05-06 DIAGNOSIS — Z331 Pregnant state, incidental: Secondary | ICD-10-CM

## 2016-05-06 LAB — POCT URINALYSIS DIPSTICK
Blood, UA: NEGATIVE
Glucose, UA: NEGATIVE
Ketones, UA: NEGATIVE
Leukocytes, UA: NEGATIVE
Nitrite, UA: NEGATIVE
Protein, UA: NEGATIVE

## 2016-05-06 NOTE — Progress Notes (Signed)
Fetal Surveillance Testing today:  FHR 150   High Risk Pregnancy Diagnosis(es):   Class D DM  G2P1 [redacted]w[redacted]d Estimated Date of Delivery: 11/05/16  Blood pressure 110/60, pulse 76, weight 166 lb (75.3 kg), last menstrual period 01/30/2016.  Urinalysis: Negative   HPI: The patient is being seen today for ongoing management of Class D DM. Today she reports CBG are a little lower than I want, will loosen her insulin up just a little   BP weight and urine results all reviewed and noted. Patient reports good fetal movement, denies any bleeding and no rupture of membranes symptoms or regular contractions.  Fundal Height:  14  Fetal Heart rate:  150 Edema:  none  Patient is without complaints other than noted in her HPI. All questions were answered.  All lab and sonogram results have been reviewed. Comments:   Assessment:  1.  Pregnancy at [redacted]w[redacted]d,  Estimated Date of Delivery: 11/05/16 :                          2.  Class D DM                        3.    Medication(s) Plans:  Insulin pump changes made, loosened a bit  Treatment Plan:  2nd IT 2 weeks with visit, per protocol  Return in about 2 weeks (around 05/20/2016) for HROB,2nd IT  with Dr Despina Hidden. for appointment for high risk OB care  No orders of the defined types were placed in this encounter.  Orders Placed This Encounter  Procedures  . POCT urinalysis dipstick

## 2016-05-10 ENCOUNTER — Encounter: Payer: Self-pay | Admitting: Adult Health

## 2016-05-10 DIAGNOSIS — O24011 Pre-existing diabetes mellitus, type 1, in pregnancy, first trimester: Secondary | ICD-10-CM | POA: Diagnosis not present

## 2016-05-20 ENCOUNTER — Ambulatory Visit (INDEPENDENT_AMBULATORY_CARE_PROVIDER_SITE_OTHER): Payer: 59 | Admitting: Obstetrics & Gynecology

## 2016-05-20 VITALS — BP 104/78 | HR 78 | Wt 168.0 lb

## 2016-05-20 DIAGNOSIS — O24912 Unspecified diabetes mellitus in pregnancy, second trimester: Secondary | ICD-10-CM

## 2016-05-20 DIAGNOSIS — Z369 Encounter for antenatal screening, unspecified: Secondary | ICD-10-CM

## 2016-05-20 DIAGNOSIS — O09892 Supervision of other high risk pregnancies, second trimester: Secondary | ICD-10-CM

## 2016-05-20 DIAGNOSIS — O24312 Unspecified pre-existing diabetes mellitus in pregnancy, second trimester: Secondary | ICD-10-CM

## 2016-05-20 DIAGNOSIS — Z331 Pregnant state, incidental: Secondary | ICD-10-CM

## 2016-05-20 DIAGNOSIS — Z36 Encounter for antenatal screening of mother: Secondary | ICD-10-CM | POA: Diagnosis not present

## 2016-05-20 DIAGNOSIS — Z1389 Encounter for screening for other disorder: Secondary | ICD-10-CM

## 2016-05-20 DIAGNOSIS — Z3A16 16 weeks gestation of pregnancy: Secondary | ICD-10-CM

## 2016-05-20 LAB — POCT URINALYSIS DIPSTICK
Blood, UA: NEGATIVE
Glucose, UA: NEGATIVE
Ketones, UA: NEGATIVE
Leukocytes, UA: NEGATIVE
Nitrite, UA: NEGATIVE
Protein, UA: NEGATIVE

## 2016-05-20 NOTE — Progress Notes (Signed)
Fetal Surveillance Testing today:  FHR 144   High Risk Pregnancy Diagnosis(es):   Class D DM, excellent control  G2P1 8557w6d Estimated Date of Delivery: 11/05/16  Blood pressure 104/78, pulse 78, weight 168 lb (76.2 kg), last menstrual period 01/30/2016.  Urinalysis: Negative   HPI: The patient is being seen today for ongoing management of Class D DM. Today she reports BS are overall excellent, no pump changes today   BP weight and urine results all reviewed and noted. Patient reports good fetal movement, denies any bleeding and no rupture of membranes symptoms or regular contractions.  Fundal Height:  16 Fetal Heart rate:  144 Edema:  none  Patient is without complaints other than noted in her HPI. All questions were answered.  All lab and sonogram results have been reviewed. Comments:    Assessment:  1.  Pregnancy at 3757w6d,  Estimated Date of Delivery: 11/05/16 :                          2.  Class D DM on medtronic pump                        3.    Medication(s) Plans:  No pump changes  Treatment Plan:  Per protocol, ECHO 24-28 wks  Return in about 3 weeks (around 06/10/2016) for 20 week sono, HROB, with Dr Despina HiddenEure. for appointment for high risk OB care  No orders of the defined types were placed in this encounter.  Orders Placed This Encounter  Procedures  . US OB Comp + 14 Wk  . Maternal Screen, Integrated #2  . POCT urinalysis dipstick

## 2016-05-21 ENCOUNTER — Encounter: Payer: Self-pay | Admitting: Adult Health

## 2016-05-25 LAB — MATERNAL SCREEN, INTEGRATED #2
AFP MoM: 1.19
Alpha-Fetoprotein: 27.9 ng/mL
Crown Rump Length: 58.4 mm
DIA MoM: 0.54
DIA Value: 89.4 pg/mL
Estriol, Unconjugated: 0.57 ng/mL
Gest. Age on Collection Date: 12.3 weeks
Gestational Age: 16.1 weeks
Maternal Age at EDD: 29.9 years
Nuchal Translucency (NT): 1.2 mm
Nuchal Translucency MoM: 0.8
Number of Fetuses: 1
PAPP-A MoM: 0.47
PAPP-A Value: 361.6 ng/mL
PDF: 0
Test Results:: NEGATIVE
Weight: 167 [lb_av]
Weight: 167 [lb_av]
hCG MoM: 1.32
hCG Value: 42.6 IU/mL
uE3 MoM: 0.7

## 2016-06-07 DIAGNOSIS — Z9641 Presence of insulin pump (external) (internal): Secondary | ICD-10-CM | POA: Diagnosis not present

## 2016-06-07 DIAGNOSIS — O24011 Pre-existing diabetes mellitus, type 1, in pregnancy, first trimester: Secondary | ICD-10-CM | POA: Diagnosis not present

## 2016-06-10 ENCOUNTER — Ambulatory Visit (INDEPENDENT_AMBULATORY_CARE_PROVIDER_SITE_OTHER): Payer: 59

## 2016-06-10 ENCOUNTER — Ambulatory Visit (INDEPENDENT_AMBULATORY_CARE_PROVIDER_SITE_OTHER): Payer: 59 | Admitting: Obstetrics and Gynecology

## 2016-06-10 VITALS — BP 104/80 | HR 85 | Wt 173.4 lb

## 2016-06-10 DIAGNOSIS — O321XX1 Maternal care for breech presentation, fetus 1: Secondary | ICD-10-CM | POA: Diagnosis not present

## 2016-06-10 DIAGNOSIS — O24912 Unspecified diabetes mellitus in pregnancy, second trimester: Secondary | ICD-10-CM

## 2016-06-10 DIAGNOSIS — Z331 Pregnant state, incidental: Secondary | ICD-10-CM

## 2016-06-10 DIAGNOSIS — Z1389 Encounter for screening for other disorder: Secondary | ICD-10-CM

## 2016-06-10 DIAGNOSIS — Z3A19 19 weeks gestation of pregnancy: Secondary | ICD-10-CM | POA: Diagnosis not present

## 2016-06-10 DIAGNOSIS — Z3402 Encounter for supervision of normal first pregnancy, second trimester: Secondary | ICD-10-CM

## 2016-06-10 LAB — POCT URINALYSIS DIPSTICK
Blood, UA: NEGATIVE
Leukocytes, UA: NEGATIVE
Nitrite, UA: NEGATIVE
Protein, UA: NEGATIVE

## 2016-06-10 NOTE — Progress Notes (Signed)
Lindsey [name] Risk Pregnancy Diagnosis(es):   [name], Lindsey [name] [name], Lindsey [name] [name], Lindsey [name] 173 lb 6.4 oz (78.7 kg), Lindsey [name] menstrual period 01/30/2016.  Urinalysis: Not done   HPI: Early CharsJessica P [name] is a 30 [name]  BP Lindsey [name] and urine results all reviewed and noted. Patient reports good fetal movement, denies any bleeding and no rupture of membranes symptoms or regular contractions.  Pt has no acute complaints at this time. She reports BS are overall Lindsey [name]  Fundal Height:  U0 Fetal Heart rate:  138 bpm   Patient is without complaints. All questions were answered.  Assessment:  1364w6d; [name] on medtronic pump  Medication(s) Plans:  Pt to forward us current pump settings.   Treatment Plan:  Growth can q 4 weeks and cardiac echo at 28 weeks.   Follow up in 4 weeks for OB appt, routine OB visit.   By signing my name below, I, Freida BusmanDiana Omoyeni, attest that this documentation has been prepared under the direction and in the presence of Tilda BurrowJohn V Arwa Yero, MD . Electronically Signed: Freida Busmaniana Omoyeni, Scribe. 06/10/2016. 4:44 PM. I personally performed the services described in this documentation, which was SCRIBED in my presence. The recorded information has been reviewed and considered accurate. It has been edited as necessary during review. Tilda BurrowFERGUSON,Mellie Buccellato V, MD

## 2016-06-10 NOTE — Progress Notes (Signed)
US 18+6 wks,breech,ant pl gr 0, cx 3.9 cm,svp of fluid 5.3 cm,normal ov's bilat,fhr 138 bpm,efw 317 g,anatomy complete,no obvious abnormalities seen

## 2016-06-11 DIAGNOSIS — Z9641 Presence of insulin pump (external) (internal): Secondary | ICD-10-CM | POA: Diagnosis not present

## 2016-06-11 DIAGNOSIS — E109 Type 1 diabetes mellitus without complications: Secondary | ICD-10-CM | POA: Diagnosis not present

## 2016-06-11 DIAGNOSIS — Z794 Long term (current) use of insulin: Secondary | ICD-10-CM | POA: Diagnosis not present

## 2016-07-01 ENCOUNTER — Ambulatory Visit (INDEPENDENT_AMBULATORY_CARE_PROVIDER_SITE_OTHER): Payer: 59 | Admitting: Obstetrics & Gynecology

## 2016-07-01 VITALS — BP 134/64 | HR 92 | Wt 174.0 lb

## 2016-07-01 DIAGNOSIS — Z331 Pregnant state, incidental: Secondary | ICD-10-CM

## 2016-07-01 DIAGNOSIS — O09892 Supervision of other high risk pregnancies, second trimester: Secondary | ICD-10-CM

## 2016-07-01 DIAGNOSIS — O24912 Unspecified diabetes mellitus in pregnancy, second trimester: Secondary | ICD-10-CM

## 2016-07-01 DIAGNOSIS — Z3A22 22 weeks gestation of pregnancy: Secondary | ICD-10-CM

## 2016-07-01 DIAGNOSIS — Z1389 Encounter for screening for other disorder: Secondary | ICD-10-CM

## 2016-07-01 LAB — POCT URINALYSIS DIPSTICK
Blood, UA: NEGATIVE
Glucose, UA: NEGATIVE
Ketones, UA: NEGATIVE
Leukocytes, UA: NEGATIVE
Nitrite, UA: NEGATIVE

## 2016-07-01 NOTE — Progress Notes (Signed)
Fetal Surveillance Testing today:  FHR 145   High Risk Pregnancy Diagnosis(es):   Class D DM  G2P1 2131w6d Estimated Date of Delivery: 11/05/16  Blood pressure 134/64, pulse 92, weight 174 lb (78.9 kg), last menstrual period 01/30/2016.  Urinalysis: Negative   HPI: The patient is being seen today for ongoing management of Class D DM. Today she reports CBG have been overall good   BP weight and urine results all reviewed and noted. Patient reports good fetal movement, denies any bleeding and no rupture of membranes symptoms or regular contractions.  Fundal Height:  22 Fetal Heart rate:  145 Edema:  none  Patient is without complaints other than noted in her HPI. All questions were answered.  All lab and sonogram results have been reviewed. Comments: normal   Assessment:  1.  Pregnancy at 7231w6d,  Estimated Date of Delivery: 11/05/16 :                          2.  Class D DM: very good control, A1C tomorrow                        3.    Medication(s) Plans:  Continue to adjust pump slowly if needed  Treatment Plan:  ECHO at 24-26 weeks, begin twice weekly testin 32 weeks, EFW 28 32 36 weeks  No Follow-up on file. for appointment for high risk OB care  No orders of the defined types were placed in this encounter.  Orders Placed This Encounter  Procedures  . POCT urinalysis dipstick

## 2016-07-02 DIAGNOSIS — O24012 Pre-existing diabetes mellitus, type 1, in pregnancy, second trimester: Secondary | ICD-10-CM | POA: Diagnosis not present

## 2016-07-02 DIAGNOSIS — Z9641 Presence of insulin pump (external) (internal): Secondary | ICD-10-CM | POA: Diagnosis not present

## 2016-07-12 ENCOUNTER — Encounter: Payer: Self-pay | Admitting: Obstetrics & Gynecology

## 2016-07-12 DIAGNOSIS — E109 Type 1 diabetes mellitus without complications: Secondary | ICD-10-CM | POA: Diagnosis not present

## 2016-07-15 ENCOUNTER — Encounter: Payer: Self-pay | Admitting: Obstetrics & Gynecology

## 2016-07-15 ENCOUNTER — Ambulatory Visit (INDEPENDENT_AMBULATORY_CARE_PROVIDER_SITE_OTHER): Payer: 59 | Admitting: Obstetrics & Gynecology

## 2016-07-15 VITALS — BP 90/60 | HR 74 | Wt 179.0 lb

## 2016-07-15 DIAGNOSIS — O24912 Unspecified diabetes mellitus in pregnancy, second trimester: Secondary | ICD-10-CM

## 2016-07-15 DIAGNOSIS — O0992 Supervision of high risk pregnancy, unspecified, second trimester: Secondary | ICD-10-CM

## 2016-07-15 DIAGNOSIS — Z331 Pregnant state, incidental: Secondary | ICD-10-CM

## 2016-07-15 DIAGNOSIS — Z1389 Encounter for screening for other disorder: Secondary | ICD-10-CM

## 2016-07-15 LAB — POCT URINALYSIS DIPSTICK
Blood, UA: NEGATIVE
Glucose, UA: NEGATIVE
Ketones, UA: NEGATIVE
Leukocytes, UA: NEGATIVE
Nitrite, UA: NEGATIVE

## 2016-07-15 NOTE — Progress Notes (Signed)
Fetal Surveillance Testing today:  FHR 145   High Risk Pregnancy Diagnosis(es):   Class D DM  G2P1 4170w6d Estimated Date of Delivery: 11/05/16  Blood pressure 90/60, pulse 74, weight 179 lb (81.2 kg), last menstrual period 01/30/2016.  Urinalysis: Negative   HPI: The patient is being seen today for ongoing management of Class D DM. Today she reports CBG log reviewed excellent, small incremental adjustments are being mad   BP weight and urine results all reviewed and noted. Patient reports good fetal movement, denies any bleeding and no rupture of membranes symptoms or regular contractions.  Fundal Height:  26 Fetal Heart rate:  145 Edema:  nine  Patient is without complaints other than noted in her HPI. All questions were answered.  All lab and sonogram results have been reviewed. Comments: abnormal:    Assessment:  1.  Pregnancy at 6170w6d,  Estimated Date of Delivery: 11/05/16 :                          2.  Class D DM                        3.    Medication(s) Plans:  Continue to slowly adjust insulin pump as needed  Treatment Plan:  Per protocol, fetal ECHO within 2-4 weeks, PN2 minus glucola 4 weeks, EFW 4 weeks  Return in about 3 weeks (around 08/05/2016) for PN2 without glucola, HROB, with Dr Despina HiddenEure. for appointment for high risk OB care  No orders of the defined types were placed in this encounter.  Orders Placed This Encounter  Procedures  . POCT urinalysis dipstick

## 2016-07-28 DIAGNOSIS — O24012 Pre-existing diabetes mellitus, type 1, in pregnancy, second trimester: Secondary | ICD-10-CM | POA: Diagnosis not present

## 2016-07-28 DIAGNOSIS — Z9641 Presence of insulin pump (external) (internal): Secondary | ICD-10-CM | POA: Diagnosis not present

## 2016-08-02 DIAGNOSIS — O24012 Pre-existing diabetes mellitus, type 1, in pregnancy, second trimester: Secondary | ICD-10-CM | POA: Diagnosis not present

## 2016-08-05 ENCOUNTER — Encounter: Payer: Self-pay | Admitting: Obstetrics & Gynecology

## 2016-08-05 ENCOUNTER — Encounter: Payer: 59 | Admitting: Obstetrics & Gynecology

## 2016-08-05 ENCOUNTER — Other Ambulatory Visit: Payer: 59

## 2016-08-05 VITALS — BP 100/60 | HR 80 | Wt 183.0 lb

## 2016-08-05 DIAGNOSIS — Z3482 Encounter for supervision of other normal pregnancy, second trimester: Secondary | ICD-10-CM | POA: Diagnosis not present

## 2016-08-05 LAB — POCT URINALYSIS DIPSTICK
Blood, UA: NEGATIVE
Glucose, UA: NEGATIVE
Leukocytes, UA: NEGATIVE
Nitrite, UA: NEGATIVE
Protein, UA: NEGATIVE

## 2016-08-05 MED FILL — CONTOUR NEXT STRIPS: 90 days supply | Qty: 600 | Fill #1

## 2016-08-05 MED FILL — HumaLOG 100 UNIT/ML SOLN: 100 | 90 days supply | Qty: 90 | Fill #1

## 2016-08-06 ENCOUNTER — Other Ambulatory Visit: Payer: Self-pay | Admitting: Women's Health

## 2016-08-06 ENCOUNTER — Telehealth: Payer: Self-pay | Admitting: *Deleted

## 2016-08-06 LAB — ANTIBODY SCREEN: Antibody Screen: NEGATIVE

## 2016-08-06 LAB — CBC
Hematocrit: 30.1 % — ABNORMAL LOW (ref 34.0–46.6)
Hemoglobin: 10.1 g/dL — ABNORMAL LOW (ref 11.1–15.9)
MCH: 28.8 pg (ref 26.6–33.0)
MCHC: 33.6 g/dL (ref 31.5–35.7)
MCV: 86 fL (ref 79–97)
Platelets: 238 10*3/uL (ref 150–379)
RBC: 3.51 x10E6/uL — ABNORMAL LOW (ref 3.77–5.28)
RDW: 13.1 % (ref 12.3–15.4)
WBC: 9.8 10*3/uL (ref 3.4–10.8)

## 2016-08-06 LAB — RPR: RPR Ser Ql: NONREACTIVE

## 2016-08-06 LAB — HIV ANTIBODY (ROUTINE TESTING W REFLEX): HIV Screen 4th Generation wRfx: NONREACTIVE

## 2016-08-06 MED ORDER — FERROUS SULFATE 325 (65 FE) MG PO TABS: 325.0000 mg | ORAL_TABLET | Freq: Two times a day (BID) | ORAL | 3 refills | Status: DC

## 2016-08-06 NOTE — Telephone Encounter (Signed)
Pt informed of anemia and RX sent, instructed to take iron and PNV daily and increase iron in diet.  Pt verbalized understanding.

## 2016-08-12 ENCOUNTER — Other Ambulatory Visit: Payer: Self-pay | Admitting: Obstetrics & Gynecology

## 2016-08-12 DIAGNOSIS — O24912 Unspecified diabetes mellitus in pregnancy, second trimester: Secondary | ICD-10-CM

## 2016-08-13 ENCOUNTER — Encounter: Payer: Self-pay | Admitting: Obstetrics & Gynecology

## 2016-08-13 ENCOUNTER — Ambulatory Visit (INDEPENDENT_AMBULATORY_CARE_PROVIDER_SITE_OTHER): Payer: 59

## 2016-08-13 ENCOUNTER — Ambulatory Visit (INDEPENDENT_AMBULATORY_CARE_PROVIDER_SITE_OTHER): Payer: 59 | Admitting: Obstetrics & Gynecology

## 2016-08-13 VITALS — BP 110/80 | HR 72 | Wt 183.0 lb

## 2016-08-13 DIAGNOSIS — Z3A28 28 weeks gestation of pregnancy: Secondary | ICD-10-CM | POA: Diagnosis not present

## 2016-08-13 DIAGNOSIS — O24913 Unspecified diabetes mellitus in pregnancy, third trimester: Secondary | ICD-10-CM

## 2016-08-13 DIAGNOSIS — O0993 Supervision of high risk pregnancy, unspecified, third trimester: Secondary | ICD-10-CM

## 2016-08-13 DIAGNOSIS — Z1389 Encounter for screening for other disorder: Secondary | ICD-10-CM

## 2016-08-13 DIAGNOSIS — O24912 Unspecified diabetes mellitus in pregnancy, second trimester: Secondary | ICD-10-CM

## 2016-08-13 DIAGNOSIS — Z331 Pregnant state, incidental: Secondary | ICD-10-CM

## 2016-08-13 LAB — POCT URINALYSIS DIPSTICK
Blood, UA: NEGATIVE
Glucose, UA: NEGATIVE
Ketones, UA: NEGATIVE
Leukocytes, UA: NEGATIVE
Nitrite, UA: NEGATIVE
Protein, UA: NEGATIVE

## 2016-08-13 MED ORDER — FERRALET 90 90-1 MG PO TABS: 1.0000 | ORAL_TABLET | Freq: Every day | ORAL | 11 refills | Status: DC

## 2016-08-13 MED FILL — FERRAPLUS 90 TABLET: 90-1 | 30 days supply | Qty: 30 | Fill #0

## 2016-08-13 NOTE — Progress Notes (Signed)
Fetal Surveillance Testing today:  Sonogram is normal   High Risk Pregnancy Diagnosis(es):   Class D DM  G2P1 3137w0d Estimated Date of Delivery: 11/05/16  Blood pressure 110/80, pulse 72, weight 183 lb (83 kg), last menstrual period 01/30/2016.  Urinalysis: Negative   HPI: The patient is being seen today for ongoing management of Class D DM, . Today she reports CBG are good, going up a little on her basal rate   BP weight and urine results all reviewed and noted. Patient reports good fetal movement, denies any bleeding and no rupture of membranes symptoms or regular contractions.  Fundal Height:  30 Fetal Heart rate:  141 Edema:  none  Patient is without complaints other than noted in her HPI. All questions were answered.  All lab and sonogram results have been reviewed. Comments:    Assessment:  1.  Pregnancy at 237w0d,  Estimated Date of Delivery: 11/05/16 :                          2.  Class D DM                        3.  Borderline Polyhydramnios due to #2  Medication(s) Plans:  Pt is altering her medtronic pump  Treatment Plan:  Begin twice weekly surveillance in 4 weeks  Return in about 2 weeks (around 08/27/2016) for HROB, with Dr Despina HiddenEure. for appointment for high risk OB care  Meds ordered this encounter  Medications  . Fe Cbn-Fe Gluc-FA-B12-C-DSS (FERRALET 90) 90-1 MG TABS    Sig: Take 1 capsule by mouth daily.    Dispense:  30 each    Refill:  11   Orders Placed This Encounter  Procedures  . POCT urinalysis dipstick

## 2016-08-13 NOTE — Progress Notes (Signed)
US 28 wks,cephalic,cx 3.3 cm,ant pl gr 0,normal ov's bilat,AFI 24 cm,fhr 126 bpm,efw 1460 g 79%,AC 97%

## 2016-08-25 DIAGNOSIS — O24012 Pre-existing diabetes mellitus, type 1, in pregnancy, second trimester: Secondary | ICD-10-CM | POA: Diagnosis not present

## 2016-08-25 DIAGNOSIS — O24013 Pre-existing diabetes mellitus, type 1, in pregnancy, third trimester: Secondary | ICD-10-CM | POA: Diagnosis not present

## 2016-08-25 DIAGNOSIS — Z9641 Presence of insulin pump (external) (internal): Secondary | ICD-10-CM | POA: Diagnosis not present

## 2016-08-27 ENCOUNTER — Ambulatory Visit (INDEPENDENT_AMBULATORY_CARE_PROVIDER_SITE_OTHER): Payer: 59 | Admitting: Obstetrics & Gynecology

## 2016-08-27 ENCOUNTER — Encounter: Payer: Self-pay | Admitting: Obstetrics & Gynecology

## 2016-08-27 VITALS — BP 102/54 | HR 96 | Wt 185.0 lb

## 2016-08-27 DIAGNOSIS — O24912 Unspecified diabetes mellitus in pregnancy, second trimester: Secondary | ICD-10-CM

## 2016-08-27 DIAGNOSIS — O24913 Unspecified diabetes mellitus in pregnancy, third trimester: Secondary | ICD-10-CM

## 2016-08-27 DIAGNOSIS — O0993 Supervision of high risk pregnancy, unspecified, third trimester: Secondary | ICD-10-CM

## 2016-08-27 DIAGNOSIS — Z3A30 30 weeks gestation of pregnancy: Secondary | ICD-10-CM

## 2016-08-27 DIAGNOSIS — Z1389 Encounter for screening for other disorder: Secondary | ICD-10-CM

## 2016-08-27 DIAGNOSIS — Z331 Pregnant state, incidental: Secondary | ICD-10-CM

## 2016-08-27 LAB — POCT URINALYSIS DIPSTICK
Blood, UA: NEGATIVE
Glucose, UA: NEGATIVE
Ketones, UA: NEGATIVE
Nitrite, UA: NEGATIVE
Protein, UA: NEGATIVE

## 2016-08-27 NOTE — Progress Notes (Signed)
Fetal Surveillance Testing today:  FHR 135   High Risk Pregnancy Diagnosis(es):   Class D DM  G2P1 4380w0d Estimated Date of Delivery: 11/05/16  Blood pressure (!) 102/54, pulse 96, weight 185 lb (83.9 kg), last menstrual period 01/30/2016.  Urinalysis: Negative   HPI: The patient is being seen today for ongoing management of Class D DM. Today she reports CBG are excellent, made minor basal adjustments   BP weight and urine results all reviewed and noted. Patient reports good fetal movement, denies any bleeding and no rupture of membranes symptoms or regular contractions.  Fundal Height:  32 Fetal Heart rate:  135 Edema:  none  Patient is without complaints other than noted in her HPI. All questions were answered.  All lab and sonogram results have been reviewed. Comments:    Assessment:  1.  Pregnancy at 1980w0d,  Estimated Date of Delivery: 11/05/16 :                          2.  Class D DM, well controlled                        3.    Medication(s) Plans:  Minor basal pump changes  Treatment Plan:  Sonogram in 2 weeks, then begin twice weekly surveillance  No Follow-up on file. for appointment for high risk OB care  No orders of the defined types were placed in this encounter.  Orders Placed This Encounter  Procedures  . US OB Follow Up  . US UA Cord Doppler  . US Fetal BPP W/O Non Stress  . POCT urinalysis dipstick

## 2016-09-07 ENCOUNTER — Encounter: Payer: Self-pay | Admitting: Obstetrics & Gynecology

## 2016-09-09 DIAGNOSIS — O24013 Pre-existing diabetes mellitus, type 1, in pregnancy, third trimester: Secondary | ICD-10-CM | POA: Diagnosis not present

## 2016-09-09 DIAGNOSIS — O24012 Pre-existing diabetes mellitus, type 1, in pregnancy, second trimester: Secondary | ICD-10-CM | POA: Diagnosis not present

## 2016-09-09 DIAGNOSIS — Z9641 Presence of insulin pump (external) (internal): Secondary | ICD-10-CM | POA: Diagnosis not present

## 2016-09-09 DIAGNOSIS — E1065 Type 1 diabetes mellitus with hyperglycemia: Secondary | ICD-10-CM | POA: Diagnosis not present

## 2016-09-10 DIAGNOSIS — Z9641 Presence of insulin pump (external) (internal): Secondary | ICD-10-CM | POA: Diagnosis not present

## 2016-09-10 DIAGNOSIS — Z794 Long term (current) use of insulin: Secondary | ICD-10-CM | POA: Diagnosis not present

## 2016-09-10 DIAGNOSIS — E109 Type 1 diabetes mellitus without complications: Secondary | ICD-10-CM | POA: Diagnosis not present

## 2016-09-13 ENCOUNTER — Encounter: Payer: Self-pay | Admitting: Obstetrics & Gynecology

## 2016-09-13 ENCOUNTER — Ambulatory Visit (INDEPENDENT_AMBULATORY_CARE_PROVIDER_SITE_OTHER): Payer: 59

## 2016-09-13 ENCOUNTER — Ambulatory Visit (INDEPENDENT_AMBULATORY_CARE_PROVIDER_SITE_OTHER): Payer: 59 | Admitting: Obstetrics & Gynecology

## 2016-09-13 VITALS — BP 118/70 | HR 74 | Wt 184.0 lb

## 2016-09-13 DIAGNOSIS — O24313 Unspecified pre-existing diabetes mellitus in pregnancy, third trimester: Secondary | ICD-10-CM | POA: Diagnosis not present

## 2016-09-13 DIAGNOSIS — Z3A33 33 weeks gestation of pregnancy: Secondary | ICD-10-CM

## 2016-09-13 DIAGNOSIS — O24913 Unspecified diabetes mellitus in pregnancy, third trimester: Secondary | ICD-10-CM

## 2016-09-13 DIAGNOSIS — O24912 Unspecified diabetes mellitus in pregnancy, second trimester: Secondary | ICD-10-CM

## 2016-09-13 DIAGNOSIS — O0992 Supervision of high risk pregnancy, unspecified, second trimester: Secondary | ICD-10-CM

## 2016-09-13 DIAGNOSIS — Z1389 Encounter for screening for other disorder: Secondary | ICD-10-CM

## 2016-09-13 DIAGNOSIS — Z331 Pregnant state, incidental: Secondary | ICD-10-CM

## 2016-09-13 DIAGNOSIS — O0993 Supervision of high risk pregnancy, unspecified, third trimester: Secondary | ICD-10-CM

## 2016-09-13 LAB — POCT URINALYSIS DIPSTICK
Blood, UA: NEGATIVE
Glucose, UA: 4
Leukocytes, UA: NEGATIVE
Nitrite, UA: NEGATIVE
Protein, UA: NEGATIVE

## 2016-09-13 NOTE — Progress Notes (Signed)
US 32+2 wks,cephalic,ant pl gr 0,bilat adnexa's wnl,fhr 164 bpm,afi 19.9 cm,RI .61,.66,BPP 8/8,EFW 2786 g >90%,AC >98%

## 2016-09-13 NOTE — Progress Notes (Signed)
Fetal Surveillance Testing today:  BPP 8/8    High Risk Pregnancy Diagnosis(es):   Class D DM  G2P1 1274w3d Estimated Date of Delivery: 11/05/16  Blood pressure 118/70, pulse 74, weight 184 lb (83.5 kg), last menstrual period 01/30/2016.  Urinalysis: Positive for 4+ glucose   HPI: The patient is being seen today for ongoing management of Class D DM, LGA. Today she reports CBG are good, no tweaks this week   BP weight and urine results all reviewed and noted. Patient reports good fetal movement, denies any bleeding and no rupture of membranes symptoms or regular contractions.  Fundal Height:  36 Fetal Heart rate:  135 Edema:  none  Patient is without complaints other than noted in her HPI. All questions were answered.  All lab and sonogram results have been reviewed. Comments:   Assessment:  1.  Pregnancy at 8874w3d,  Estimated Date of Delivery: 11/05/16 :                          2.  Class D DM                        3.  LGA, EFW>90%  Medication(s) Plans:  medtronic pump  Treatment Plan:  Twice weekly survellance, NST, sonogram EFW 36 weeks again  Return in about 3 days (around 09/16/2016) for afternnon, , NST, HROB, with Dr Despina HiddenEure. for appointment for high risk OB care  No orders of the defined types were placed in this encounter.  Orders Placed This Encounter  Procedures  . POCT urinalysis dipstick

## 2016-09-16 ENCOUNTER — Encounter: Payer: Self-pay | Admitting: Obstetrics & Gynecology

## 2016-09-16 ENCOUNTER — Ambulatory Visit (INDEPENDENT_AMBULATORY_CARE_PROVIDER_SITE_OTHER): Payer: 59 | Admitting: Obstetrics & Gynecology

## 2016-09-16 VITALS — BP 110/70 | HR 80 | Wt 184.0 lb

## 2016-09-16 DIAGNOSIS — Z3A33 33 weeks gestation of pregnancy: Secondary | ICD-10-CM

## 2016-09-16 DIAGNOSIS — O0993 Supervision of high risk pregnancy, unspecified, third trimester: Secondary | ICD-10-CM

## 2016-09-16 DIAGNOSIS — Z1389 Encounter for screening for other disorder: Secondary | ICD-10-CM

## 2016-09-16 DIAGNOSIS — O24913 Unspecified diabetes mellitus in pregnancy, third trimester: Secondary | ICD-10-CM | POA: Diagnosis not present

## 2016-09-16 DIAGNOSIS — Z331 Pregnant state, incidental: Secondary | ICD-10-CM

## 2016-09-16 LAB — POCT URINALYSIS DIPSTICK
Blood, UA: NEGATIVE
Glucose, UA: 1
Leukocytes, UA: NEGATIVE
Nitrite, UA: NEGATIVE

## 2016-09-16 NOTE — Progress Notes (Signed)
Fetal Surveillance Testing today:  Reactive NST   High Risk Pregnancy Diagnosis(es):   Class D DM  G2P1 7960w6d Estimated Date of Delivery: 11/05/16  Blood pressure 110/70, pulse 80, weight 184 lb (83.5 kg), last menstrual period 01/30/2016.  Urinalysis: Negative   HPI: The patient is being seen today for ongoing management of Class D DM, LGA fetus. Today she reports CBG a little erratic, changed everything out and better   BP weight and urine results all reviewed and noted. Patient reports good fetal movement, denies any bleeding and no rupture of membranes symptoms or regular contractions.  Fundal Height:  34 Fetal Heart rate:  140 Edema:  none  Patient is without complaints other than noted in her HPI. All questions were answered.  All lab and sonogram results have been reviewed. Comments:    Assessment:  1.  Pregnancy at 5160w6d,  Estimated Date of Delivery: 11/05/16 :                          2.  Class D DM                        3.  LGA  Medication(s) Plans:  Continue to alter pump as needed  Treatment Plan:  Twice weekly surveillance, NST, sonogram 36 weeks to track EFW  No Follow-up on file. for appointment for high risk OB care  No orders of the defined types were placed in this encounter.  Orders Placed This Encounter  Procedures  . POCT urinalysis dipstick

## 2016-09-21 ENCOUNTER — Encounter: Payer: Self-pay | Admitting: Obstetrics & Gynecology

## 2016-09-21 ENCOUNTER — Ambulatory Visit (INDEPENDENT_AMBULATORY_CARE_PROVIDER_SITE_OTHER): Payer: 59 | Admitting: Obstetrics & Gynecology

## 2016-09-21 VITALS — BP 112/80 | HR 84 | Wt 189.0 lb

## 2016-09-21 DIAGNOSIS — O0993 Supervision of high risk pregnancy, unspecified, third trimester: Secondary | ICD-10-CM

## 2016-09-21 DIAGNOSIS — Z3A34 34 weeks gestation of pregnancy: Secondary | ICD-10-CM | POA: Diagnosis not present

## 2016-09-21 DIAGNOSIS — Z331 Pregnant state, incidental: Secondary | ICD-10-CM

## 2016-09-21 DIAGNOSIS — O24913 Unspecified diabetes mellitus in pregnancy, third trimester: Secondary | ICD-10-CM

## 2016-09-21 DIAGNOSIS — Z1389 Encounter for screening for other disorder: Secondary | ICD-10-CM

## 2016-09-21 LAB — POCT URINALYSIS DIPSTICK
Blood, UA: NEGATIVE
Glucose, UA: NEGATIVE
Ketones, UA: NEGATIVE
Leukocytes, UA: NEGATIVE
Nitrite, UA: NEGATIVE
Protein, UA: NEGATIVE

## 2016-09-21 NOTE — Progress Notes (Signed)
Fetal Surveillance Testing today:  Reactive NST   High Risk Pregnancy Diagnosis(es):   Class D DM  G2P1 3745w4d Estimated Date of Delivery: 11/05/16  Blood pressure 112/80, pulse 84, weight 189 lb (85.7 kg), last menstrual period 01/30/2016, unknown if currently breastfeeding.  Urinalysis: Negative   HPI: The patient is being seen today for ongoing management of as above. Today she reports CBG are good   BP weight and urine results all reviewed and noted. Patient reports good fetal movement, denies any bleeding and no rupture of membranes symptoms or regular contractions.  Fundal Height:  35 Fetal Heart rate:  135 Edema:  none  Patient is without complaints other than noted in her HPI. All questions were answered.  All lab and sonogram results have been reviewed. Comments:    Assessment:  1.  Pregnancy at 845w4d,  Estimated Date of Delivery: 11/05/16 :                          2.  Class D DM                        3.    Medication(s) Plans:  No pump changes  Treatment Plan:  Twice weekly testing efw 36 weeks delivery 39 weeks  Return in about 3 days (around 09/24/2016) for NST, HROB. for appointment for high risk OB care  No orders of the defined types were placed in this encounter.  Orders Placed This Encounter  Procedures  . POCT urinalysis dipstick

## 2016-09-22 DIAGNOSIS — O24013 Pre-existing diabetes mellitus, type 1, in pregnancy, third trimester: Secondary | ICD-10-CM | POA: Diagnosis not present

## 2016-09-22 DIAGNOSIS — E1065 Type 1 diabetes mellitus with hyperglycemia: Secondary | ICD-10-CM | POA: Diagnosis not present

## 2016-09-22 DIAGNOSIS — Z9641 Presence of insulin pump (external) (internal): Secondary | ICD-10-CM | POA: Diagnosis not present

## 2016-09-24 ENCOUNTER — Other Ambulatory Visit: Payer: 59

## 2016-09-24 ENCOUNTER — Encounter: Payer: Self-pay | Admitting: Obstetrics and Gynecology

## 2016-09-24 ENCOUNTER — Telehealth: Payer: Self-pay | Admitting: Obstetrics and Gynecology

## 2016-09-24 ENCOUNTER — Ambulatory Visit (INDEPENDENT_AMBULATORY_CARE_PROVIDER_SITE_OTHER): Payer: 59 | Admitting: Obstetrics and Gynecology

## 2016-09-24 VITALS — BP 124/58 | HR 90 | Wt 191.0 lb

## 2016-09-24 DIAGNOSIS — Z3A34 34 weeks gestation of pregnancy: Secondary | ICD-10-CM | POA: Diagnosis not present

## 2016-09-24 DIAGNOSIS — O0993 Supervision of high risk pregnancy, unspecified, third trimester: Secondary | ICD-10-CM

## 2016-09-24 DIAGNOSIS — O24913 Unspecified diabetes mellitus in pregnancy, third trimester: Secondary | ICD-10-CM | POA: Diagnosis not present

## 2016-09-24 DIAGNOSIS — Z1389 Encounter for screening for other disorder: Secondary | ICD-10-CM | POA: Diagnosis not present

## 2016-09-24 DIAGNOSIS — Z331 Pregnant state, incidental: Secondary | ICD-10-CM

## 2016-09-24 DIAGNOSIS — Z029 Encounter for administrative examinations, unspecified: Secondary | ICD-10-CM

## 2016-09-24 LAB — POCT URINALYSIS DIPSTICK
Blood, UA: NEGATIVE
Glucose, UA: NEGATIVE
Ketones, UA: NEGATIVE
Leukocytes, UA: NEGATIVE
Nitrite, UA: NEGATIVE
Protein, UA: NEGATIVE

## 2016-09-24 NOTE — Progress Notes (Signed)
High Risk Pregnancy HROB Diagnosis(es):   Class D diabetes insulin pump  G2P1 8418w0d Estimated Date of Delivery: 11/05/16    HPI: The patient is being seen today for ongoing management of diabetes mellitus class B Dow [redacted] weeks gestation. Today she reports good fetal movement. The baby was more active in the evenings. It is not unusual for the baby being slow to be active in the mornings Patient reports good fetal movement, denies any bleeding and no rupture of membranes symptoms or regular contractions.   BP weight and urine results reviewed and noted. Blood pressure (!) 124/58, pulse 90, weight 191 lb (86.6 kg), last menstrual period 01/30/2016.  Fundal Height:  34 cm on Tuesday Fetal Heart rate:  145 Physical Examination: Abdomen - soft, nontender, nondistended, no masses or organomegaly Gravid nontender uterus consistent with dates                                     Pelvic - not required                                     Edema:  Minimal  Urinalysis:NEGATIVE for protein and glucose nitrites                 POSITIVE for nothing  Fetal Surveillance Testing today:  Nonstress test reactive  Lab and sonogram results have been reviewed. The patient's most recent ultrasound for fetal weight at December 2, 32 weeks showed estimated fetal weight greater than 90th percentile with removal of her large abdominal circumference. Will need serial ultrasounds for growth to watch for macrosomia  Assessment:  1.  Pregnancy at 6918w0d,  G2P1   :  Class D diabetes                        2.  Fetal growth greater than 90th percentile                        3. Reactive NST  Medication(s) Plans:  Blood glucose per insulin pump  Treatment Plan:  unchanged  Follow up in 0.5 weeks for appointment for high risk OB care, BPP.                                            To ask re: opthalmology appt                                              To ask re: fetal echo

## 2016-09-24 NOTE — Telephone Encounter (Signed)
At today's visit the patient left without reviewing blood sugars. Message left for patient to call me on my cell to discuss blood sugars if necessary

## 2016-09-28 ENCOUNTER — Encounter: Payer: Self-pay | Admitting: Obstetrics & Gynecology

## 2016-09-28 ENCOUNTER — Ambulatory Visit (INDEPENDENT_AMBULATORY_CARE_PROVIDER_SITE_OTHER): Payer: 59 | Admitting: Obstetrics & Gynecology

## 2016-09-28 VITALS — BP 126/68 | HR 76 | Wt 190.0 lb

## 2016-09-28 DIAGNOSIS — Z1389 Encounter for screening for other disorder: Secondary | ICD-10-CM

## 2016-09-28 DIAGNOSIS — O24313 Unspecified pre-existing diabetes mellitus in pregnancy, third trimester: Secondary | ICD-10-CM

## 2016-09-28 DIAGNOSIS — Z331 Pregnant state, incidental: Secondary | ICD-10-CM

## 2016-09-28 DIAGNOSIS — O3660X0 Maternal care for excessive fetal growth, unspecified trimester, not applicable or unspecified: Secondary | ICD-10-CM | POA: Diagnosis not present

## 2016-09-28 DIAGNOSIS — O0993 Supervision of high risk pregnancy, unspecified, third trimester: Secondary | ICD-10-CM

## 2016-09-28 DIAGNOSIS — Z3A35 35 weeks gestation of pregnancy: Secondary | ICD-10-CM

## 2016-09-28 DIAGNOSIS — O24913 Unspecified diabetes mellitus in pregnancy, third trimester: Secondary | ICD-10-CM

## 2016-09-28 LAB — POCT URINALYSIS DIPSTICK
Blood, UA: NEGATIVE
Ketones, UA: NEGATIVE
Leukocytes, UA: NEGATIVE
Nitrite, UA: NEGATIVE

## 2016-09-28 NOTE — Progress Notes (Signed)
Fetal Surveillance Testing today:  Reactive NST   High Risk Pregnancy Diagnosis(es):   Class D DM, LGA  G2P1 10948w4d Estimated Date of Delivery: 11/05/16  Blood pressure 126/68, pulse 76, weight 190 lb (86.2 kg), last menstrual period 01/30/2016.  Urinalysis: Negative   HPI: The patient is being seen today for ongoing management of Class D DM. Today she reports blood sugars in essentially normal with her am fasting a little high this am, will bump up 0.05 units overnight basal   BP weight and urine results all reviewed and noted. Patient reports good fetal movement, denies any bleeding and no rupture of membranes symptoms or regular contractions.  Fundal Height:  38 Fetal Heart rate:  140 Edema:  none  Patient is without complaints other than noted in her HPI. All questions were answered.  All lab and sonogram results have been reviewed. Comments:    Assessment:  1.  Pregnancy at 948w4d,  Estimated Date of Delivery: 11/05/16 :                          2.  Class D DM, well controlled                        3.  LGA  Medication(s) Plans:  Continue pump adjustments as needed  Treatment Plan:  twcie weekly NST, sonogram at 36 weeks  Return in about 3 days (around 10/01/2016) for NST, HROB, with Dr Despina HiddenEure. for appointment for high risk OB care  No orders of the defined types were placed in this encounter.  Orders Placed This Encounter  Procedures  . POCT urinalysis dipstick

## 2016-09-28 NOTE — Progress Notes (Signed)
Class D DM

## 2016-10-01 ENCOUNTER — Encounter: Payer: Self-pay | Admitting: Obstetrics & Gynecology

## 2016-10-01 ENCOUNTER — Ambulatory Visit (INDEPENDENT_AMBULATORY_CARE_PROVIDER_SITE_OTHER): Payer: 59 | Admitting: Obstetrics & Gynecology

## 2016-10-01 ENCOUNTER — Encounter (HOSPITAL_COMMUNITY): Payer: Self-pay | Admitting: *Deleted

## 2016-10-01 ENCOUNTER — Inpatient Hospital Stay (HOSPITAL_COMMUNITY)
Admission: AD | Admit: 2016-10-01 | Discharge: 2016-10-01 | Disposition: A | Discharge status: Home / Self Care | Payer: 59 | Source: Ambulatory Visit | Attending: Obstetrics and Gynecology | Admitting: Obstetrics and Gynecology

## 2016-10-01 ENCOUNTER — Inpatient Hospital Stay (HOSPITAL_COMMUNITY): Payer: 59

## 2016-10-01 VITALS — BP 120/70 | HR 80 | Wt 191.4 lb

## 2016-10-01 DIAGNOSIS — F0781 Postconcussional syndrome: Secondary | ICD-10-CM | POA: Diagnosis not present

## 2016-10-01 DIAGNOSIS — O24913 Unspecified diabetes mellitus in pregnancy, third trimester: Secondary | ICD-10-CM

## 2016-10-01 DIAGNOSIS — S0093XA Contusion of unspecified part of head, initial encounter: Secondary | ICD-10-CM | POA: Diagnosis not present

## 2016-10-01 DIAGNOSIS — Z3A35 35 weeks gestation of pregnancy: Secondary | ICD-10-CM | POA: Diagnosis not present

## 2016-10-01 DIAGNOSIS — Z331 Pregnant state, incidental: Secondary | ICD-10-CM

## 2016-10-01 DIAGNOSIS — T1490XA Injury, unspecified, initial encounter: Secondary | ICD-10-CM

## 2016-10-01 DIAGNOSIS — Z794 Long term (current) use of insulin: Secondary | ICD-10-CM | POA: Insufficient documentation

## 2016-10-01 DIAGNOSIS — Z1389 Encounter for screening for other disorder: Secondary | ICD-10-CM

## 2016-10-01 DIAGNOSIS — E162 Hypoglycemia, unspecified: Secondary | ICD-10-CM | POA: Insufficient documentation

## 2016-10-01 DIAGNOSIS — R55 Syncope and collapse: Secondary | ICD-10-CM | POA: Insufficient documentation

## 2016-10-01 DIAGNOSIS — O26893 Other specified pregnancy related conditions, third trimester: Secondary | ICD-10-CM | POA: Diagnosis not present

## 2016-10-01 DIAGNOSIS — R42 Dizziness and giddiness: Secondary | ICD-10-CM | POA: Diagnosis not present

## 2016-10-01 DIAGNOSIS — O0993 Supervision of high risk pregnancy, unspecified, third trimester: Secondary | ICD-10-CM

## 2016-10-01 DIAGNOSIS — S0990XA Unspecified injury of head, initial encounter: Secondary | ICD-10-CM | POA: Diagnosis not present

## 2016-10-01 DIAGNOSIS — O3663X1 Maternal care for excessive fetal growth, third trimester, fetus 1: Secondary | ICD-10-CM

## 2016-10-01 DIAGNOSIS — R404 Transient alteration of awareness: Secondary | ICD-10-CM | POA: Diagnosis not present

## 2016-10-01 DIAGNOSIS — O3660X0 Maternal care for excessive fetal growth, unspecified trimester, not applicable or unspecified: Secondary | ICD-10-CM

## 2016-10-01 LAB — URINALYSIS, ROUTINE W REFLEX MICROSCOPIC
Bilirubin Urine: NEGATIVE
Glucose, UA: 50 mg/dL — AB
Hgb urine dipstick: NEGATIVE
Ketones, ur: 5 mg/dL — AB
Leukocytes, UA: NEGATIVE
Nitrite: NEGATIVE
Protein, ur: 100 mg/dL — AB
Specific Gravity, Urine: 1.029 (ref 1.005–1.030)
pH: 6 (ref 5.0–8.0)

## 2016-10-01 LAB — POCT URINALYSIS DIPSTICK
Blood, UA: NEGATIVE
Glucose, UA: NEGATIVE
Leukocytes, UA: NEGATIVE
Nitrite, UA: NEGATIVE

## 2016-10-01 LAB — GLUCOSE, CAPILLARY
Glucose-Capillary: 61 mg/dL — ABNORMAL LOW (ref 65–99)
Glucose-Capillary: 78 mg/dL (ref 65–99)
Glucose-Capillary: 94 mg/dL (ref 65–99)
Glucose-Capillary: 95 mg/dL (ref 65–99)
Glucose-Capillary: 94 mg/dL (ref 65–99)

## 2016-10-01 MED ORDER — DEXTROSE-NACL 5-0.9 % IV SOLN
INTRAVENOUS | Status: DC
  Administered 2016-10-01: 18:00:00 via INTRAVENOUS

## 2016-10-01 MED ORDER — ACETAMINOPHEN 500 MG PO TABS
1000.0000 mg | ORAL_TABLET | Freq: Once | ORAL | Status: AC
  Administered 2016-10-01: 1000 mg via ORAL
  Filled 2016-10-01: qty 2

## 2016-10-01 NOTE — Progress Notes (Signed)
Fetal Surveillance Testing today:  Reactive NST   High Risk Pregnancy Diagnosis(es):   Class D DM, LGA  G2P1 473w0d Estimated Date of Delivery: 11/05/16  Blood pressure 120/70, pulse 80, weight 191 lb 6.4 oz (86.8 kg), last menstrual period 01/30/2016.  Urinalysis: Negative   HPI: The patient is being seen today for ongoing management of Class D DM. Today she reports CBG good, slight tweaks made to her insulin basals   BP weight and urine results all reviewed and noted. Patient reports good fetal movement, denies any bleeding and no rupture of membranes symptoms or regular contractions.  Fundal Height:   Fetal Heart rate:  135 Edema:  none  Patient is without complaints other than noted in her HPI. All questions were answered.  All lab and sonogram results have been reviewed. Comments:    Assessment:  1.  Pregnancy at 8073w0d,  Estimated Date of Delivery: 11/05/16 :                          2.  Class D DM, excellent control on medtronic pump                        3.  LGA  Medication(s) Plans:  Pump   Treatment Plan:  NST 12/27, sonogram 12/29, delivery on or about 39 weeks mode TBD based on EFW  Return in about 5 days (around 10/06/2016) for NST on 10/06/16 with Joellyn HaffKim Booker then BPP/sono, HROB, with Dr Despina HiddenEure on 12/29. for appointment for high risk OB care  No orders of the defined types were placed in this encounter.  Orders Placed This Encounter  Procedures  . US OB Follow Up  . US Fetal BPP W/O Non Stress  . POCT urinalysis dipstick

## 2016-10-01 NOTE — Discharge Instructions (Signed)
Post-Concussion Syndrome Introduction Post-concussion syndrome describes the symptoms that can occur after a head injury. These symptoms can last from weeks to months. What are the causes? It is not clear why some head injuries cause post-concussion syndrome. It can occur whether your head injury was mild or severe and whether you were wearing head protection or not. What are the signs or symptoms?  Memory difficulties.  Dizziness.  Headaches.  Double vision or blurry vision.  Sensitivity to light.  Hearing difficulties.  Depression.  Tiredness.  Weakness.  Difficulty with concentration.  Difficulty sleeping or staying asleep.  Vomiting.  Poor balance or instability on your feet.  Slow reaction time.  Difficulty learning and remembering things you have heard. How is this diagnosed? There is no test to determine whether you have post-concussion syndrome. Your health care provider may order an imaging scan of your brain, such as a CT scan, to check for other problems that may be causing your symptoms (such as a severe injury inside your skull). How is this treated? Usually, these problems disappear over time without medical care. Your health care provider may prescribe medicine to help ease your symptoms. It is important to follow up with a neurologist to evaluate your recovery and address any lingering symptoms or issues. Follow these instructions at home:  Take medicines only as directed by your health care provider. Do not take aspirin. Aspirin can slow blood clotting.  Sleep with your head slightly elevated to help with headaches.  Avoid any situation where there is potential for another head injury. This includes football, hockey, soccer, basketball, martial arts, downhill snow sports, and horseback riding. Your condition will get worse every time you experience a concussion. You should avoid these activities until you are evaluated by the appropriate follow-up health  care providers.  Keep all follow-up visits as directed by your health care provider. This is important. Contact a health care provider if:  You have increased problems paying attention or concentrating.  You have increased difficulty remembering or learning new information.  You need more time to complete tasks or assignments than before.  You have increased irritability or decreased ability to cope with stress.  You have more symptoms than before. Seek medical care if you have any of the following symptoms for more than two weeks after your injury:  Lasting (chronic) headaches.  Dizziness or balance problems.  Nausea.  Vision problems.  Increased sensitivity to noise or light.  Depression or mood swings.  Anxiety or irritability.  Memory problems.  Difficulty concentrating or paying attention.  Sleep problems.  Feeling tired all the time. Get help right away if:  You have confusion or unusual drowsiness.  Others find it difficult to wake you up.  You have nausea or persistent, forceful vomiting.  You feel like you are moving when you are not (vertigo). Your eyes may move rapidly back and forth.  You have convulsions or faint.  You have severe, persistent headaches that are not relieved by medicine.  You cannot use your arms or legs normally.  One of your pupils is larger than the other.  You have clear or bloody discharge from your nose or ears.  Your problems are getting worse, not better. This information is not intended to replace advice given to you by your health care provider. Make sure you discuss any questions you have with your health care provider. Document Released: 03/19/2002 Document Revised: 04/16/2016 Document Reviewed: 01/02/2014  2017 [employer]

## 2016-10-01 NOTE — MAU Provider Note (Signed)
History     CSN: [id number]  Arrival date and time: 10/01/16 1713   First Provider Initiated Contact with Patient 10/01/16 1728      Chief Complaint  Patient presents with  . Loss of Consciousness   Patient is a 30 y/o F G2P1 at 35wks today who presented after falling at the nail salon. She had felt slightly ill this AM and [facility] She reports she went to get her nails done it was warm and she felt like she was going to throw up. She reported that she stood up felt flushed and collapsed. She fell on her back and hit the back of her head. When she came to in abut 10 seconds she felt dazed. She had some flashes of light and floaters in her eyes. [facility] and upon arrival had trouble remembering some of there information including her insurance. She has never had head trauma or a concussion before. She has pain in the back of her head. At time or arrival denies any changes in her vision and she is oriented x3. She denies any abdominal trauma. Reports regular fetal movement, no contractions or vaginal bleeding.     OB History    Gravida Para Term Preterm AB Living   2 1       1    SAB TAB Ectopic Multiple Live Births                  Obstetric Comments   Diabetic on insulin pump---------class D      Past Medical History:  Diagnosis Date  . Diabetes mellitus   . Diabetes mellitus affecting pregnancy in first trimester 03/16/2016  . Pregnant 03/16/2016  . Supervision of other high-risk pregnancy 03/31/2016    [facility] Initiated Care at   8+5 weeks FOB  Lindsey [name] 30 yo wm second child Dating By  LMP and US Pap  03/31/16 GC/CT Initial:                36+wks: Genetic Screen NT/IT:  CF screen  Anatomic US  Flu vaccine  Tdap Recommended ~ 28wks Glucose Screen  2 hr GBS  Feed Preference  Contraception  Circumcision  Childbirth Classes  Pediatrician      Past Surgical History:  Procedure Laterality Date  . NO PAST SURGERIES       Family History  Problem Relation Age of Onset  . Hypertension Father   . Cancer Maternal Grandmother   . Cancer Maternal Grandfather   . Alzheimer's disease Paternal Grandfather     Social History  Substance Use Topics  . Smoking status: Never Smoker  . Smokeless tobacco: Never Used  . Alcohol use No     Comment: not now!!    Allergies: No Known Allergies  Prescriptions Prior to Admission  Medication Sig Dispense Refill Last Dose  . cetirizine (ZYRTEC) 10 MG tablet Take 10 mg by mouth daily.   Taking  . Fe Cbn-Fe Gluc-FA-B12-C-DSS (FERRALET 90) 90-1 MG TABS Take 1 capsule by mouth daily. 30 each 11 Taking  . insulin lispro (HUMALOG) 100 UNIT/ML injection Inject into the skin. Use up to 100 units per day as directed in insulin pump   Taking  . Prenatal Vit-Fe Fumarate-FA (MULTIVITAMIN-PRENATAL) 27-0.8 MG TABS tablet Take 1 tablet by mouth daily at 12 noon.   Taking    Review of Systems  Constitutional: Negative for chills and fever.  HENT: Negative  for congestion and nosebleeds.   Eyes: Negative for blurred vision and double vision.  Respiratory: Negative for cough and shortness of breath.   Cardiovascular: Negative for chest pain and palpitations.  Gastrointestinal: Negative for abdominal pain, heartburn, nausea and vomiting.  Genitourinary: Negative for dysuria, frequency and urgency.  Musculoskeletal: Positive for back pain. Negative for myalgias.  Neurological: Positive for dizziness and headaches. Negative for tingling and tremors.  Endo/Heme/Allergies: Negative for environmental allergies. Does not bruise/bleed easily.  Psychiatric/Behavioral: Negative for depression and suicidal ideas.   Physical Exam   Blood pressure 130/69, pulse 90, temperature 98 F (36.7 C), temperature source Oral, resp. rate 18, last menstrual period 01/30/2016, SpO2 100 %.  Physical Exam  Vitals reviewed. Constitutional: She is oriented to person, place, and time. She appears  well-developed and well-nourished.  Cardiovascular: Normal rate, regular rhythm and normal heart sounds.   Respiratory: Effort normal and breath sounds normal. No respiratory distress.  GI: Soft. Bowel sounds are normal. She exhibits no distension and no mass. There is no tenderness. There is no rebound and no guarding.  Neurological: She is alert and oriented to person, place, and time. She displays normal reflexes. No cranial nerve deficit. She exhibits normal muscle tone. Coordination normal.  Full neuro exam including reflexes, orientation, and CN are all intact x2 2hours apart.  Skin: Skin is warm and dry.  Psychiatric: She has a normal mood and affect. Her behavior is normal.    MAU Course  Procedures  MDM In MAU patient underwent continuous fetal monitoring revealing reactive NST and no contractions  Full neuro-exam was normal x2 2 hours apart  CT head was negative  Patient reported mild nausea in MAU, but was able to tolerate snacks.  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 She was given glass of OJ and started on D5NS. She responded well and had sugars in the 90s. 30 minutes after d5 was stoppped her sugar remained 90.   Assessment and Plan  1. Post concussive syndrome: advised mental and visual rest, given info, [facility]. Given return precautions 2. Hypogylcemia: likley from decreased PO intake today, advised frequent BG checks before she goes to bed. Advised small frequent meals.    Lindsey [name] 10/01/2016, 8:21 PM

## 2016-10-01 NOTE — MAU Note (Signed)
Around , was getting nails down.  Felt like she was going to get sick, got up, passed, hit head.  States she feels funny now, can't remember.  Last ate around noon: soup, crackers and an apple. Pt had checked blood sugar  Was 111.  Got sick this morning- vomited once then

## 2016-10-06 ENCOUNTER — Encounter: Payer: Self-pay | Admitting: Women's Health

## 2016-10-06 ENCOUNTER — Ambulatory Visit (INDEPENDENT_AMBULATORY_CARE_PROVIDER_SITE_OTHER): Payer: 59 | Admitting: Women's Health

## 2016-10-06 VITALS — BP 120/68 | HR 80 | Wt 187.0 lb

## 2016-10-06 DIAGNOSIS — Z3A36 36 weeks gestation of pregnancy: Secondary | ICD-10-CM

## 2016-10-06 DIAGNOSIS — O99891 Other specified diseases and conditions complicating pregnancy: Secondary | ICD-10-CM | POA: Insufficient documentation

## 2016-10-06 DIAGNOSIS — Z283 Underimmunization status: Secondary | ICD-10-CM

## 2016-10-06 DIAGNOSIS — O09899 Supervision of other high risk pregnancies, unspecified trimester: Secondary | ICD-10-CM | POA: Insufficient documentation

## 2016-10-06 DIAGNOSIS — Z2839 Other underimmunization status: Secondary | ICD-10-CM | POA: Insufficient documentation

## 2016-10-06 DIAGNOSIS — O9989 Other specified diseases and conditions complicating pregnancy, childbirth and the puerperium: Secondary | ICD-10-CM | POA: Diagnosis not present

## 2016-10-06 DIAGNOSIS — O24913 Unspecified diabetes mellitus in pregnancy, third trimester: Secondary | ICD-10-CM

## 2016-10-06 DIAGNOSIS — Z331 Pregnant state, incidental: Secondary | ICD-10-CM

## 2016-10-06 DIAGNOSIS — O0993 Supervision of high risk pregnancy, unspecified, third trimester: Secondary | ICD-10-CM

## 2016-10-06 DIAGNOSIS — Z1389 Encounter for screening for other disorder: Secondary | ICD-10-CM

## 2016-10-06 LAB — POCT URINALYSIS DIPSTICK
Blood, UA: NEGATIVE
Glucose, UA: NEGATIVE
Ketones, UA: NEGATIVE
Leukocytes, UA: NEGATIVE
Nitrite, UA: NEGATIVE
Protein, UA: NEGATIVE

## 2016-10-06 NOTE — Progress Notes (Signed)
High Risk Pregnancy Diagnosis(es): ClassD DM, insulin pump G2P1 6159w5d Estimated Date of Delivery: 11/05/16 BP 120/68   Pulse 80   Wt 187 lb (84.8 kg)   LMP 01/30/2016 (Exact Date)   BMI 32.10 kg/m   Urinalysis: Negative HPI:  Reports CBGs all w/in range but 1. Diarrhea x 3d, upwards of 11x's/day, no n/v. Staying hydrated. Hasn't been around any sick contacts she's aware of. Hasn't taken any otc meds.  Passed out on 12/22 when at nail salon, hit head, [facility], [facility] head [facility], monitored >4hrs, doing well now. Thinks it was fumes in nail salon.  BP, weight, and urine reviewed.  Reports good fm. Denies regular uc's, lof, vb, uti s/s. No complaints.  Fundal Height:  39 Fetal Heart rate:  130, reactive NST Edema:  none  Reviewed ptl s/s, fkc. Recommended Tdap at HD/PCP per CDC guidelines.  All questions were answered Assessment: 5359w5d ClassD DM, insulin pump Medication(s) Plans:  Continue current regimen, can take imodium if needed for diarrhea- stay well hydrated Treatment Plan:  Continue 2x/wk testing nst alt w/ sono, delivery @ 39wks per LHE, mode TBD based on EFW (>90% at 32wks) Follow up in 2d on Friday as scheduled for high-risk OB appt and bpp u/s

## 2016-10-06 NOTE — Patient Instructions (Addendum)
Imodium if needed for diarrhea, stay well hydrated  Call the office [phone]) or go to [facility] if:  You begin to have strong, frequent contractions  Your water breaks.  Sometimes it is a big gush of fluid, sometimes it is just a trickle that keeps getting your panties wet or running down your legs  You have vaginal bleeding.  It is normal to have a small amount of spotting if your cervix was checked.   You don't feel your baby moving like normal.  If you don't, get you something to eat and drink and lay down and focus on feeling your baby move.  You should feel at least 10 movements in 2 hours.  If you don't, you should call the office or go to [facility].   Tdap Vaccine  It is recommended that you get the Tdap vaccine during the third trimester of EACH pregnancy to help protect your baby from getting pertussis (whooping cough)  27-36 weeks is the BEST time to do this so that you can pass the protection on to your baby. During pregnancy is better than after pregnancy, but if you are unable to get it during pregnancy it will be offered at the hospital.   You can get this vaccine at the health department or your family doctor  Everyone who will be around your baby should also be up-to-date on their vaccines. Adults (who are not pregnant) only need 1 dose of Tdap during adulthood.    Preterm Labor and Birth Information The normal length of a pregnancy is 39-41 weeks. Preterm labor is when labor starts before 37 completed weeks of pregnancy. What are the risk factors for preterm labor? Preterm labor is more likely to occur in women who:  Have certain infections during pregnancy such as a bladder infection, sexually transmitted infection, or infection inside the uterus (chorioamnionitis).  Have a shorter-than-normal cervix.  Have gone into preterm labor before.  Have had surgery on their cervix.  Are younger than age 29 or older than age 29.  Are African  American.  Are pregnant with twins or multiple babies (multiple gestation).  Take street drugs or smoke while pregnant.  Do not gain enough weight while pregnant.  Became pregnant shortly after having been pregnant. What are the symptoms of preterm labor? Symptoms of preterm labor include:  Cramps similar to those that can happen during a menstrual period. The cramps may happen with diarrhea.  Pain in the abdomen or lower back.  Regular uterine contractions that may feel like tightening of the abdomen.  A feeling of increased pressure in the pelvis.  Increased watery or bloody mucus discharge from the vagina.  Water breaking (ruptured amniotic sac). Why is it important to recognize signs of preterm labor? It is important to recognize signs of preterm labor because babies who are born prematurely may not be fully developed. This can put them at an increased risk for:  Long-term (chronic) heart and lung problems.  Difficulty immediately after birth with regulating body systems, including blood sugar, body temperature, heart rate, and breathing rate.  Bleeding in the brain.  Cerebral palsy.  Learning difficulties.  Death. These risks are highest for babies who are born before 34 weeks of pregnancy. How is preterm labor treated? Treatment depends on the length of your pregnancy, your condition, and the health of your baby. It may involve:  Having a stitch (suture) placed in your cervix to prevent your cervix from opening too early (cerclage).  Taking  or being given medicines, such as:  Hormone medicines. These may be given early in pregnancy to help support the pregnancy.  Medicine to stop contractions.  Medicines to help mature the baby's lungs. These may be prescribed if the risk of delivery is high.  Medicines to prevent your baby from developing cerebral palsy. If the labor happens before 34 weeks of pregnancy, you may need to stay in the hospital. What should I  do if I think I am in preterm labor? If you think that you are going into preterm labor, call your health care provider right away. How can I prevent preterm labor in future pregnancies? To increase your chance of having a full-term pregnancy:  Do not use any tobacco products, such as cigarettes, chewing tobacco, and e-cigarettes. If you need help quitting, ask your health care provider.  Do not use street drugs or medicines that have not been prescribed to you during your pregnancy.  Talk with your health care provider before taking any herbal supplements, even if you have been taking them regularly.  Make sure you gain a healthy amount of weight during your pregnancy.  Watch for infection. If you think that you might have an infection, get it checked right away.  Make sure to tell your health care provider if you have gone into preterm labor before. This information is not intended to replace advice given to you by your health care provider. Make sure you discuss any questions you have with your health care provider. Document Released: 12/18/2003 Document Revised: 03/09/2016 Document Reviewed: 02/18/2016 Elsevier Interactive Patient Education  2017 [employer]

## 2016-10-07 DIAGNOSIS — Z9641 Presence of insulin pump (external) (internal): Secondary | ICD-10-CM | POA: Diagnosis not present

## 2016-10-07 DIAGNOSIS — O24013 Pre-existing diabetes mellitus, type 1, in pregnancy, third trimester: Secondary | ICD-10-CM | POA: Diagnosis not present

## 2016-10-08 ENCOUNTER — Ambulatory Visit (INDEPENDENT_AMBULATORY_CARE_PROVIDER_SITE_OTHER): Payer: 59

## 2016-10-08 ENCOUNTER — Encounter: Payer: Self-pay | Admitting: Obstetrics & Gynecology

## 2016-10-08 ENCOUNTER — Ambulatory Visit (INDEPENDENT_AMBULATORY_CARE_PROVIDER_SITE_OTHER): Payer: 59 | Admitting: Obstetrics & Gynecology

## 2016-10-08 VITALS — BP 117/58 | HR 79 | Wt 191.3 lb

## 2016-10-08 DIAGNOSIS — Z3A36 36 weeks gestation of pregnancy: Secondary | ICD-10-CM | POA: Diagnosis not present

## 2016-10-08 DIAGNOSIS — O24913 Unspecified diabetes mellitus in pregnancy, third trimester: Secondary | ICD-10-CM

## 2016-10-08 DIAGNOSIS — O0993 Supervision of high risk pregnancy, unspecified, third trimester: Secondary | ICD-10-CM

## 2016-10-08 DIAGNOSIS — O3663X1 Maternal care for excessive fetal growth, third trimester, fetus 1: Secondary | ICD-10-CM

## 2016-10-08 DIAGNOSIS — O3660X0 Maternal care for excessive fetal growth, unspecified trimester, not applicable or unspecified: Secondary | ICD-10-CM

## 2016-10-08 DIAGNOSIS — Z331 Pregnant state, incidental: Secondary | ICD-10-CM

## 2016-10-08 DIAGNOSIS — Z1389 Encounter for screening for other disorder: Secondary | ICD-10-CM

## 2016-10-08 LAB — POCT URINALYSIS DIPSTICK
Blood, UA: NEGATIVE
Glucose, UA: NEGATIVE
Ketones, UA: NEGATIVE
Leukocytes, UA: NEGATIVE
Nitrite, UA: NEGATIVE
Protein, UA: NEGATIVE

## 2016-10-08 NOTE — Progress Notes (Signed)
US 36 wks,cephalic,ant pl gr 1,bilat adnexa's wnl,AFI 20 cm,fhr 126 bpm,BPP 8/8,EFW 3632 g >90%,AC >98%

## 2016-10-08 NOTE — Progress Notes (Signed)
Fetal Surveillance Testing today:  BPP 8/8   High Risk Pregnancy Diagnosis(es):   Class D DM  G2P1001 5338w0d Estimated Date of Delivery: 11/05/16  Blood pressure (!) 117/58, pulse 79, weight 191 lb 4.8 oz (86.8 kg), last menstrual period 01/30/2016.  Urinalysis: Negative   HPI: The patient is being seen today for ongoing management of as above. Today she reports no headaches or other complaints, some contractions   BP weight and urine results all reviewed and noted. Patient reports good fetal movement, denies any bleeding and no rupture of membranes symptoms or regular contractions.  Fundal Height:  38 Fetal Heart rate:  135 Edema:  none  Patient is without complaints other than noted in her HPI. All questions were answered.  All lab and sonogram results have been reviewed. Comments:    Assessment:  1.  Pregnancy at 6038w0d,  Estimated Date of Delivery: 11/05/16 :                          2.  Class D DM                        3.  LGA with disproportionate abdomen  Medication(s) Plans:  Continue medtronic pump, no adjustments needed  Treatment Plan:  Will make a game time decision if we get to 39 weeks regarding delivery mode, certinly there is the concern for shoulder dystocia, no other EFW are needed or will be helpful, cont twice weekly surveillance  Return in about 4 days (around 10/12/2016) for NST, HROB, with Dr Despina HiddenEure. for appointment for high risk OB care  No orders of the defined types were placed in this encounter.  Orders Placed This Encounter  Procedures  . POCT urinalysis dipstick

## 2016-10-12 ENCOUNTER — Ambulatory Visit (INDEPENDENT_AMBULATORY_CARE_PROVIDER_SITE_OTHER): Payer: 59 | Admitting: Obstetrics & Gynecology

## 2016-10-12 VITALS — BP 133/61 | HR 84 | Wt 197.0 lb

## 2016-10-12 DIAGNOSIS — Z3483 Encounter for supervision of other normal pregnancy, third trimester: Secondary | ICD-10-CM

## 2016-10-12 DIAGNOSIS — O24913 Unspecified diabetes mellitus in pregnancy, third trimester: Secondary | ICD-10-CM

## 2016-10-12 DIAGNOSIS — O3663X1 Maternal care for excessive fetal growth, third trimester, fetus 1: Secondary | ICD-10-CM

## 2016-10-12 DIAGNOSIS — Z3A37 37 weeks gestation of pregnancy: Secondary | ICD-10-CM

## 2016-10-12 DIAGNOSIS — O0993 Supervision of high risk pregnancy, unspecified, third trimester: Secondary | ICD-10-CM

## 2016-10-12 DIAGNOSIS — Z1389 Encounter for screening for other disorder: Secondary | ICD-10-CM

## 2016-10-12 DIAGNOSIS — Z331 Pregnant state, incidental: Secondary | ICD-10-CM

## 2016-10-12 DIAGNOSIS — O3660X0 Maternal care for excessive fetal growth, unspecified trimester, not applicable or unspecified: Secondary | ICD-10-CM

## 2016-10-12 LAB — POCT URINALYSIS DIPSTICK
Blood, UA: NEGATIVE
Glucose, UA: NEGATIVE
Ketones, UA: NEGATIVE
Leukocytes, UA: NEGATIVE
Nitrite, UA: NEGATIVE
Protein, UA: NEGATIVE

## 2016-10-12 NOTE — Progress Notes (Signed)
Fetal Surveillance Testing today:  Reactive NST   High Risk Pregnancy Diagnosis(es):   Class D DM  G2P1001 7052w4d Estimated Date of Delivery: 11/05/16  Blood pressure 133/61, pulse 84, weight 197 lb (89.4 kg), last menstrual period 01/30/2016.  Urinalysis: Negative   HPI: The patient is being seen today for ongoing management of as above. Today she reports CBG are excellent   BP weight and urine results all reviewed and noted. Patient reports good fetal movement, denies any bleeding and no rupture of membranes symptoms or regular contractions.  Fundal Height:  39 Fetal Heart rate:  125 Edema:  trace  Patient is without complaints other than noted in her HPI. All questions were answered.  All lab and sonogram results have been reviewed. Comments:    Assessment:  1.  Pregnancy at 1652w4d,  Estimated Date of Delivery: 11/05/16 :                          2.  Class D DM                          Medication(s) Plans:  Continue to adjust pump as needed  Treatment Plan:  Twice weekly NST, deliver at 39 weeks, mode to be determined based on clinical factors  Return in about 3 days (around 10/15/2016) for NST, HROB, with Dr Despina HiddenEure. for appointment for high risk OB care  No orders of the defined types were placed in this encounter.  Orders Placed This Encounter  Procedures  . Strep Gp B NAA  . GC/Chlamydia Probe Amp  . POCT urinalysis dipstick

## 2016-10-14 LAB — GC/CHLAMYDIA PROBE AMP
Chlamydia trachomatis, NAA: NEGATIVE
Neisseria gonorrhoeae by PCR: NEGATIVE

## 2016-10-14 LAB — STREP GP B NAA: Strep Gp B NAA: NEGATIVE

## 2016-10-15 ENCOUNTER — Ambulatory Visit (INDEPENDENT_AMBULATORY_CARE_PROVIDER_SITE_OTHER): Payer: 59 | Admitting: Obstetrics & Gynecology

## 2016-10-15 ENCOUNTER — Encounter: Payer: Self-pay | Admitting: Obstetrics & Gynecology

## 2016-10-15 VITALS — BP 95/63 | HR 81 | Wt 198.0 lb

## 2016-10-15 DIAGNOSIS — O3660X0 Maternal care for excessive fetal growth, unspecified trimester, not applicable or unspecified: Secondary | ICD-10-CM | POA: Diagnosis not present

## 2016-10-15 DIAGNOSIS — Z3A37 37 weeks gestation of pregnancy: Secondary | ICD-10-CM

## 2016-10-15 DIAGNOSIS — O24913 Unspecified diabetes mellitus in pregnancy, third trimester: Secondary | ICD-10-CM

## 2016-10-15 DIAGNOSIS — Z331 Pregnant state, incidental: Secondary | ICD-10-CM

## 2016-10-15 DIAGNOSIS — Z1389 Encounter for screening for other disorder: Secondary | ICD-10-CM

## 2016-10-15 DIAGNOSIS — O0993 Supervision of high risk pregnancy, unspecified, third trimester: Secondary | ICD-10-CM

## 2016-10-15 LAB — POCT URINALYSIS DIPSTICK
Blood, UA: NEGATIVE
Glucose, UA: NEGATIVE
Ketones, UA: NEGATIVE
Leukocytes, UA: NEGATIVE
Nitrite, UA: NEGATIVE
Protein, UA: NEGATIVE

## 2016-10-15 NOTE — Progress Notes (Signed)
Fetal Surveillance Testing today:  Reactive NST   High Risk Pregnancy Diagnosis(es):   Class D DM  G2P1001 5728w0d Estimated Date of Delivery: 11/05/16  Blood pressure 95/63, pulse 81, weight 198 lb (89.8 kg), last menstrual period 01/30/2016.  Urinalysis: Negative   HPI: The patient is being seen today for ongoing management of as above. Today she reports CBG are good no pump adjustments made   BP weight and urine results all reviewed and noted. Patient reports good fetal movement, denies any bleeding and no rupture of membranes symptoms or regular contractions.  Fundal Height:  39 Fetal Heart rate:  140 Edema:  none  Patient is without complaints other than noted in her HPI. All questions were answered.  All lab and sonogram results have been reviewed. Comments:    Assessment:  1.  Pregnancy at 1828w0d,  Estimated Date of Delivery: 11/05/16 :                          2.  Class D DM                        3.  LGA  Medication(s) Plans:  No pump adjustments  Treatment Plan:  Twice weekly surveillance, induction 39 weeks, sonogram 454w4d for last EFW data point  Return in about 4 days (around 10/19/2016) for NST, HROB with Joellyn HaffKim Booker. for appointment for high risk OB care  No orders of the defined types were placed in this encounter.  Orders Placed This Encounter  Procedures  . POCT urinalysis dipstick

## 2016-10-18 DIAGNOSIS — E109 Type 1 diabetes mellitus without complications: Secondary | ICD-10-CM | POA: Diagnosis not present

## 2016-10-18 DIAGNOSIS — Z9641 Presence of insulin pump (external) (internal): Secondary | ICD-10-CM | POA: Diagnosis not present

## 2016-10-18 DIAGNOSIS — Z794 Long term (current) use of insulin: Secondary | ICD-10-CM | POA: Diagnosis not present

## 2016-10-19 ENCOUNTER — Ambulatory Visit (INDEPENDENT_AMBULATORY_CARE_PROVIDER_SITE_OTHER): Payer: 59 | Admitting: Women's Health

## 2016-10-19 ENCOUNTER — Encounter: Payer: Self-pay | Admitting: Women's Health

## 2016-10-19 VITALS — BP 105/59 | HR 72 | Wt 194.0 lb

## 2016-10-19 DIAGNOSIS — O24913 Unspecified diabetes mellitus in pregnancy, third trimester: Secondary | ICD-10-CM

## 2016-10-19 DIAGNOSIS — Z3A38 38 weeks gestation of pregnancy: Secondary | ICD-10-CM

## 2016-10-19 DIAGNOSIS — O0993 Supervision of high risk pregnancy, unspecified, third trimester: Secondary | ICD-10-CM

## 2016-10-19 DIAGNOSIS — Z331 Pregnant state, incidental: Secondary | ICD-10-CM

## 2016-10-19 DIAGNOSIS — Z1389 Encounter for screening for other disorder: Secondary | ICD-10-CM

## 2016-10-19 LAB — POCT URINALYSIS DIPSTICK
Blood, UA: NEGATIVE
Glucose, UA: NEGATIVE
Ketones, UA: NEGATIVE
Leukocytes, UA: NEGATIVE
Nitrite, UA: NEGATIVE
Protein, UA: NEGATIVE

## 2016-10-19 NOTE — Patient Instructions (Signed)
Your induction is scheduled for 1/18 @ 11:45pm. Go to [facility] hospital, Maternity Admissions Unit (Emergency) entrance and let them know you are there to be induced. They will send someone from Labor & Delivery to come get you.    Call the office [phone]) or go to [facility] if:  You begin to have strong, frequent contractions  Your water breaks.  Sometimes it is a big gush of fluid, sometimes it is just a trickle that keeps getting your panties wet or running down your legs  You have vaginal bleeding.  It is normal to have a small amount of spotting if your cervix was checked.   You don't feel your baby moving like normal.  If you don't, get you something to eat and drink and lay down and focus on feeling your baby move.  You should feel at least 10 movements in 2 hours.  If you don't, you should call the office or go to [facility].      [facility] Contractions Contractions of the uterus can occur throughout pregnancy. Contractions are not always a sign that you are in labor.  WHAT ARE BRAXTON HICKS CONTRACTIONS?  Contractions that occur before labor are called Braxton Hicks contractions, or false labor. Toward the end of pregnancy (32-34 weeks), these contractions can develop more often and may become more forceful. This is not true labor because these contractions do not result in opening (dilatation) and thinning of the cervix. They are sometimes difficult to tell apart from true labor because these contractions can be forceful and people have different pain tolerances. You should not feel embarrassed if you go to the hospital with false labor. Sometimes, the only way to tell if you are in true labor is for your health care provider to look for changes in the cervix. If there are no prenatal problems or other health problems associated with the pregnancy, it is completely safe to be sent home with false labor and await the onset of true labor. HOW CAN YOU TELL THE DIFFERENCE  BETWEEN TRUE AND FALSE LABOR? False Labor   The contractions of false labor are usually shorter and not as hard as those of true labor.   The contractions are usually irregular.   The contractions are often felt in the front of the lower abdomen and in the groin.   The contractions may go away when you walk around or change positions while lying down.   The contractions get weaker and are shorter lasting as time goes on.   The contractions do not usually become progressively stronger, regular, and closer together as with true labor.  True Labor   Contractions in true labor last 30-70 seconds, become very regular, usually become more intense, and increase in frequency.   The contractions do not go away with walking.   The discomfort is usually felt in the top of the uterus and spreads to the lower abdomen and low back.   True labor can be determined by your health care provider with an exam. This will show that the cervix is dilating and getting thinner.  WHAT TO REMEMBER  Keep up with your usual exercises and follow other instructions given by your health care provider.   Take medicines as directed by your health care provider.   Keep your regular prenatal appointments.   Eat and drink lightly if you think you are going into labor.   If Braxton Hicks contractions are making you uncomfortable:   Change your position  from lying down or resting to walking, or from walking to resting.   Sit and rest in a tub of warm water.   Drink 2-3 glasses of water. Dehydration may cause these contractions.   Do slow and deep breathing several times an hour.  WHEN SHOULD I SEEK IMMEDIATE MEDICAL CARE? Seek immediate medical care if:  Your contractions become stronger, more regular, and closer together.   You have fluid leaking or gushing from your vagina.   You have a fever.   You pass blood-tinged mucus.   You have vaginal bleeding.   You have continuous  abdominal pain.   You have low back pain that you never had before.   You feel your baby's head pushing down and causing pelvic pressure.   Your baby is not moving as much as it used to.  This information is not intended to replace advice given to you by your health care provider. Make sure you discuss any questions you have with your health care provider. Document Released: 09/27/2005 Document Revised: 01/19/2016 Document Reviewed: 07/09/2013 Elsevier Interactive Patient Education  2017 [employer]

## 2016-10-19 NOTE — Progress Notes (Signed)
High Risk Pregnancy Diagnosis(es): Class D DM-insulin pump G2P1001 6085w4d Estimated Date of Delivery: 11/05/16 BP (!) 105/59   Pulse 72   Wt 194 lb (88 kg)   LMP 01/30/2016 (Exact Date)   BMI 33.30 kg/m   Urinalysis: Negative HPI:  Doing well, all sugars good BP, weight, and urine reviewed.  Reports good fm. Denies regular uc's, lof, vb, uti s/s. No complaints.  Fundal Height:  38 Fetal Heart rate:  135, reactive NST Edema:  trace  Reviewed labor s/s, fkc All questions were answered Assessment: 4085w4d Class D DM Medication(s) Plans:  Continue current pump settings Treatment Plan:  IOL scheduled for 1/19 MN (1/18 @ 2345), continue 2x/wk testing Follow up in 3d for high-risk OB appt and NST

## 2016-10-20 ENCOUNTER — Encounter (HOSPITAL_COMMUNITY): Payer: Self-pay | Admitting: *Deleted

## 2016-10-20 ENCOUNTER — Telehealth (HOSPITAL_COMMUNITY): Payer: Self-pay | Admitting: *Deleted

## 2016-10-20 NOTE — Telephone Encounter (Signed)
Preadmission screen  

## 2016-10-22 ENCOUNTER — Ambulatory Visit (INDEPENDENT_AMBULATORY_CARE_PROVIDER_SITE_OTHER): Payer: 59 | Admitting: Obstetrics & Gynecology

## 2016-10-22 ENCOUNTER — Encounter: Payer: Self-pay | Admitting: Obstetrics & Gynecology

## 2016-10-22 VITALS — BP 90/70 | HR 92 | Wt 194.4 lb

## 2016-10-22 DIAGNOSIS — O24913 Unspecified diabetes mellitus in pregnancy, third trimester: Secondary | ICD-10-CM

## 2016-10-22 DIAGNOSIS — Z3A38 38 weeks gestation of pregnancy: Secondary | ICD-10-CM

## 2016-10-22 DIAGNOSIS — O3663X1 Maternal care for excessive fetal growth, third trimester, fetus 1: Secondary | ICD-10-CM | POA: Diagnosis not present

## 2016-10-22 DIAGNOSIS — Z1389 Encounter for screening for other disorder: Secondary | ICD-10-CM

## 2016-10-22 DIAGNOSIS — O3660X0 Maternal care for excessive fetal growth, unspecified trimester, not applicable or unspecified: Secondary | ICD-10-CM

## 2016-10-22 DIAGNOSIS — Z331 Pregnant state, incidental: Secondary | ICD-10-CM

## 2016-10-22 DIAGNOSIS — O0993 Supervision of high risk pregnancy, unspecified, third trimester: Secondary | ICD-10-CM

## 2016-10-22 LAB — POCT URINALYSIS DIPSTICK
Blood, UA: NEGATIVE
Glucose, UA: NEGATIVE
Ketones, UA: NEGATIVE
Leukocytes, UA: NEGATIVE
Nitrite, UA: NEGATIVE
Protein, UA: NEGATIVE

## 2016-10-22 NOTE — Progress Notes (Signed)
Fetal Surveillance Testing today:  Reactive NST   High Risk Pregnancy Diagnosis(es):   Class D DM, LGA  G2P1001 7581w0d Estimated Date of Delivery: 11/05/16  Blood pressure 90/70, pulse 92, weight 194 lb 6.4 oz (88.2 kg), last menstrual period 01/30/2016.  Urinalysis: Negative   HPI: The patient is being seen today for ongoing management of Class D DM, LGA. Today she reports CBG are good, no adjustments to the pump   BP weight and urine results all reviewed and noted. Patient reports good fetal movement, denies any bleeding and no rupture of membranes symptoms or regular contractions.  Fundal Height:  41 Fetal Heart rate:  135 Edema:  none  Patient is without complaints other than noted in her HPI. All questions were answered.  All lab and sonogram results have been reviewed. Comments:    Assessment:  1.  Pregnancy at 5881w0d,  Estimated Date of Delivery: 11/05/16 :                          2.  Class D DM                        3.  LGA  Medication(s) Plans:  Continue medtronic pump  Treatment Plan:  Sonogram on Tuesday to evaluate EFW, will be close to 4500 so that will determine mode of delivery as well as fetal position  Return in about 4 days (around 10/26/2016) for EFW sonogram, BPP, HROB, with Dr Despina HiddenEure. for appointment for high risk OB care  No orders of the defined types were placed in this encounter.  Orders Placed This Encounter  Procedures  . US Fetal BPP W/O Non Stress  . US OB Follow Up  . POCT urinalysis dipstick

## 2016-10-26 ENCOUNTER — Ambulatory Visit (INDEPENDENT_AMBULATORY_CARE_PROVIDER_SITE_OTHER): Payer: 59

## 2016-10-26 ENCOUNTER — Encounter: Payer: Self-pay | Admitting: Obstetrics & Gynecology

## 2016-10-26 ENCOUNTER — Other Ambulatory Visit: Payer: 59 | Admitting: Obstetrics & Gynecology

## 2016-10-26 ENCOUNTER — Ambulatory Visit (INDEPENDENT_AMBULATORY_CARE_PROVIDER_SITE_OTHER): Payer: 59 | Admitting: Obstetrics & Gynecology

## 2016-10-26 VITALS — BP 120/80 | HR 80 | Wt 195.0 lb

## 2016-10-26 DIAGNOSIS — Z3A38 38 weeks gestation of pregnancy: Secondary | ICD-10-CM

## 2016-10-26 DIAGNOSIS — Z3A39 39 weeks gestation of pregnancy: Secondary | ICD-10-CM

## 2016-10-26 DIAGNOSIS — O3663X1 Maternal care for excessive fetal growth, third trimester, fetus 1: Secondary | ICD-10-CM | POA: Diagnosis not present

## 2016-10-26 DIAGNOSIS — O0993 Supervision of high risk pregnancy, unspecified, third trimester: Secondary | ICD-10-CM

## 2016-10-26 DIAGNOSIS — O24913 Unspecified diabetes mellitus in pregnancy, third trimester: Secondary | ICD-10-CM

## 2016-10-26 DIAGNOSIS — O3660X0 Maternal care for excessive fetal growth, unspecified trimester, not applicable or unspecified: Secondary | ICD-10-CM

## 2016-10-26 DIAGNOSIS — Z331 Pregnant state, incidental: Secondary | ICD-10-CM

## 2016-10-26 DIAGNOSIS — Z1389 Encounter for screening for other disorder: Secondary | ICD-10-CM

## 2016-10-26 LAB — POCT URINALYSIS DIPSTICK
Blood, UA: NEGATIVE
Glucose, UA: NEGATIVE
Ketones, UA: NEGATIVE
Leukocytes, UA: NEGATIVE
Nitrite, UA: NEGATIVE
Protein, UA: NEGATIVE

## 2016-10-26 NOTE — Progress Notes (Signed)
US 38+4 wks,cephalic,fhr 124 bpm,BPP 8/8,ant pl gr 2,afi 15 cm,normal ov's bilat,EFW 1035 g >90%,AC >98%

## 2016-10-26 NOTE — Progress Notes (Signed)
Fetal Surveillance Testing today:  BPP 8/8   High Risk Pregnancy Diagnosis(es):   Class D DM  G2P1001 9252w4d Estimated Date of Delivery: 11/05/16  Blood pressure 120/80, pulse 80, weight 195 lb (88.5 kg), last menstrual period 01/30/2016, unknown if currently breastfeeding.  Urinalysis: Negative   HPI: The patient is being seen today for ongoing management of Class D DM, suspected fetal macrosomia. Today she reports CBG are excellent no pump changes   BP weight and urine results all reviewed and noted. Patient reports good fetal movement, denies any bleeding and no rupture of membranes symptoms or regular contractions.  Fundal Height:   Fetal Heart rate:  136 Edema:  none  Patient is without complaints other than noted in her HPI. All questions were answered.  All lab and sonogram results have been reviewed. Comments:    Assessment:  1.  Pregnancy at 252w4d,  Estimated Date of Delivery: 11/05/16 :                          2.  Class D DM                        3.  Suspected fetal macrosomia  Medication(s) Plans:  No pump changes  Treatment Plan:  Talked extensively given the EFW 4000 grams and on exam fetal vertex is out of pelvis would not be appropriate to induce any way, almost oblique lie, due to these factors extensively counselled regarding mode of delivery and pt and husband agree to go with primary Caesraean section  Return in about 2 weeks (around 11/08/2016) for Post Op, with Dr Despina HiddenEure. for appointment for high risk OB care  No orders of the defined types were placed in this encounter.  Orders Placed This Encounter  Procedures  . POCT urinalysis dipstick

## 2016-10-27 ENCOUNTER — Encounter (HOSPITAL_COMMUNITY): Payer: Self-pay

## 2016-10-28 ENCOUNTER — Other Ambulatory Visit: Payer: Self-pay | Admitting: Obstetrics & Gynecology

## 2016-10-28 ENCOUNTER — Encounter (HOSPITAL_COMMUNITY)
Admission: RE | Admit: 2016-10-28 | Discharge: 2016-10-28 | Disposition: A | Discharge status: Home / Self Care | Payer: 59 | Source: Ambulatory Visit | Attending: Obstetrics & Gynecology | Admitting: Obstetrics & Gynecology

## 2016-10-28 DIAGNOSIS — K219 Gastro-esophageal reflux disease without esophagitis: Secondary | ICD-10-CM | POA: Diagnosis not present

## 2016-10-28 DIAGNOSIS — O3663X0 Maternal care for excessive fetal growth, third trimester, not applicable or unspecified: Secondary | ICD-10-CM | POA: Diagnosis not present

## 2016-10-28 DIAGNOSIS — Z794 Long term (current) use of insulin: Secondary | ICD-10-CM | POA: Diagnosis not present

## 2016-10-28 DIAGNOSIS — Z6833 Body mass index (BMI) 33.0-33.9, adult: Secondary | ICD-10-CM | POA: Diagnosis not present

## 2016-10-28 DIAGNOSIS — E109 Type 1 diabetes mellitus without complications: Secondary | ICD-10-CM | POA: Diagnosis not present

## 2016-10-28 DIAGNOSIS — O2402 Pre-existing diabetes mellitus, type 1, in childbirth: Secondary | ICD-10-CM | POA: Diagnosis not present

## 2016-10-28 DIAGNOSIS — E669 Obesity, unspecified: Secondary | ICD-10-CM | POA: Diagnosis not present

## 2016-10-28 DIAGNOSIS — O9962 Diseases of the digestive system complicating childbirth: Secondary | ICD-10-CM | POA: Diagnosis not present

## 2016-10-28 DIAGNOSIS — O99214 Obesity complicating childbirth: Secondary | ICD-10-CM | POA: Diagnosis not present

## 2016-10-28 LAB — BASIC METABOLIC PANEL
Anion gap: 6 (ref 5–15)
BUN: 9 mg/dL (ref 6–20)
CO2: 23 mmol/L (ref 22–32)
Calcium: 8.4 mg/dL — ABNORMAL LOW (ref 8.9–10.3)
Chloride: 105 mmol/L (ref 101–111)
Creatinine, Ser: 0.74 mg/dL (ref 0.44–1.00)
GFR calc Af Amer: >60 mL/min (ref >60)
GFR calc non Af Amer: >60 mL/min (ref >60)
Glucose, Bld: 140 mg/dL — ABNORMAL HIGH (ref 65–99)
Potassium: 3.8 mmol/L (ref 3.5–5.1)
Sodium: 134 mmol/L — ABNORMAL LOW (ref 135–145)

## 2016-10-28 LAB — CBC
HCT: 31 % — ABNORMAL LOW (ref 36.0–46.0)
Hemoglobin: 10.7 g/dL — ABNORMAL LOW (ref 12.0–15.0)
MCH: 27.6 pg (ref 26.0–34.0)
MCHC: 34.5 g/dL (ref 30.0–36.0)
MCV: 79.9 fL (ref 78.0–100.0)
Platelets: 250 10*3/uL (ref 150–400)
RBC: 3.88 MIL/uL (ref 3.87–5.11)
RDW: 13.4 % (ref 11.5–15.5)
WBC: 12.5 10*3/uL — ABNORMAL HIGH (ref 4.0–10.5)

## 2016-10-28 LAB — TYPE AND SCREEN
ABO/RH(D): O POS
Antibody Screen: NEGATIVE

## 2016-10-28 LAB — ABO/RH: ABO/RH(D): O POS

## 2016-10-28 NOTE — Patient Instructions (Signed)
20 Early CharsJessica P [name]  10/28/2016   Your procedure is scheduled on:  1/1902018  [facility] AM.  Pick up the phone at the desk and dial [phone].   Call this number if you have problems the morning of surgery: [phone]   Remember:   Do not eat food:After Midnight.  Do not drink clear liquids: After Midnight.  Take these medicines the morning of surgery with A SIP OF WATER: may take zyrtec.  Please decrease basal rate of insulin pump to half normal rate at 0700.     Do not wear jewelry, make-up or nail polish.  Do not wear lotions, powders, or perfumes. Do not wear deodorant.  Do not shave 48 hours prior to surgery.  Do not bring valuables to the hospital.  [facility] is not   responsible for any belongings or valuables brought to the hospital.  Contacts, dentures or bridgework may not be worn into surgery.  Leave suitcase in the car. After surgery it may be brought to your room.  For patients admitted to the hospital, checkout time is 11:00 AM the day of              discharge.   Patients discharged the day of surgery will not be allowed to drive             home.  Name and phone number of your driver: na  Special Instructions:   N/A   Please read over the following fact sheets that you were given:   Surgical Site Infection Prevention

## 2016-10-29 ENCOUNTER — Encounter (HOSPITAL_COMMUNITY): Payer: 59

## 2016-10-29 ENCOUNTER — Encounter (HOSPITAL_COMMUNITY): Payer: Self-pay

## 2016-10-29 ENCOUNTER — Inpatient Hospital Stay (HOSPITAL_COMMUNITY)
Admission: RE | Admit: 2016-10-29 | Discharge: 2016-10-31 | DRG: 765 | Disposition: A | Discharge status: Home / Self Care | Payer: 59 | Source: Ambulatory Visit | Attending: Obstetrics and Gynecology | Admitting: Obstetrics and Gynecology

## 2016-10-29 ENCOUNTER — Encounter (HOSPITAL_COMMUNITY)
Admission: RE | Disposition: A | Discharge status: Home / Self Care | Payer: Self-pay | Source: Ambulatory Visit | Attending: Obstetrics and Gynecology

## 2016-10-29 ENCOUNTER — Inpatient Hospital Stay (HOSPITAL_COMMUNITY): Payer: 59 | Admitting: Anesthesiology

## 2016-10-29 ENCOUNTER — Inpatient Hospital Stay (HOSPITAL_COMMUNITY): Admission: RE | Admit: 2016-10-29 | Payer: 59 | Source: Ambulatory Visit

## 2016-10-29 DIAGNOSIS — K219 Gastro-esophageal reflux disease without esophagitis: Secondary | ICD-10-CM | POA: Diagnosis present

## 2016-10-29 DIAGNOSIS — O9962 Diseases of the digestive system complicating childbirth: Secondary | ICD-10-CM | POA: Diagnosis present

## 2016-10-29 DIAGNOSIS — O3663X0 Maternal care for excessive fetal growth, third trimester, not applicable or unspecified: Secondary | ICD-10-CM | POA: Diagnosis not present

## 2016-10-29 DIAGNOSIS — Z9641 Presence of insulin pump (external) (internal): Secondary | ICD-10-CM | POA: Diagnosis present

## 2016-10-29 DIAGNOSIS — Z6833 Body mass index (BMI) 33.0-33.9, adult: Secondary | ICD-10-CM

## 2016-10-29 DIAGNOSIS — Z98891 History of uterine scar from previous surgery: Secondary | ICD-10-CM

## 2016-10-29 DIAGNOSIS — E109 Type 1 diabetes mellitus without complications: Secondary | ICD-10-CM | POA: Diagnosis present

## 2016-10-29 DIAGNOSIS — E669 Obesity, unspecified: Secondary | ICD-10-CM | POA: Diagnosis present

## 2016-10-29 DIAGNOSIS — O2402 Pre-existing diabetes mellitus, type 1, in childbirth: Secondary | ICD-10-CM | POA: Diagnosis present

## 2016-10-29 DIAGNOSIS — O99214 Obesity complicating childbirth: Secondary | ICD-10-CM | POA: Diagnosis present

## 2016-10-29 DIAGNOSIS — Z794 Long term (current) use of insulin: Secondary | ICD-10-CM | POA: Diagnosis not present

## 2016-10-29 DIAGNOSIS — Z3A39 39 weeks gestation of pregnancy: Secondary | ICD-10-CM

## 2016-10-29 DIAGNOSIS — Z8249 Family history of ischemic heart disease and other diseases of the circulatory system: Secondary | ICD-10-CM

## 2016-10-29 DIAGNOSIS — O43813 Placental infarction, third trimester: Secondary | ICD-10-CM | POA: Diagnosis not present

## 2016-10-29 LAB — GLUCOSE, CAPILLARY
Glucose-Capillary: 146 mg/dL — ABNORMAL HIGH (ref 65–99)
Glucose-Capillary: 175 mg/dL — ABNORMAL HIGH (ref 65–99)
Glucose-Capillary: 138 mg/dL — ABNORMAL HIGH (ref 65–99)
Glucose-Capillary: 101 mg/dL — ABNORMAL HIGH (ref 65–99)
Glucose-Capillary: 85 mg/dL (ref 65–99)

## 2016-10-29 LAB — RPR: RPR Ser Ql: NONREACTIVE

## 2016-10-29 SURGERY — Surgical Case
Anesthesia: Spinal

## 2016-10-29 MED ORDER — NALBUPHINE HCL 10 MG/ML IJ SOLN: 5.0000 mg | INTRAMUSCULAR | Status: DC | PRN

## 2016-10-29 MED ORDER — OXYCODONE HCL 5 MG PO TABS: 10.0000 mg | ORAL_TABLET | ORAL | Status: DC | PRN

## 2016-10-29 MED ORDER — SIMETHICONE 80 MG PO CHEW
80.0000 mg | CHEWABLE_TABLET | ORAL | Status: DC
  Administered 2016-10-29 – 2016-10-30 (×2): 80 mg via ORAL
  Filled 2016-10-29 (×3): qty 1

## 2016-10-29 MED ORDER — SOD CITRATE-CITRIC ACID 500-334 MG/5ML PO SOLN
30.0000 mL | Freq: Once | ORAL | Status: AC
  Administered 2016-10-29: 30 mL via ORAL

## 2016-10-29 MED ORDER — IBUPROFEN 600 MG PO TABS
600.0000 mg | ORAL_TABLET | Freq: Four times a day (QID) | ORAL | Status: DC
  Administered 2016-10-29 – 2016-10-31 (×7): 600 mg via ORAL
  Filled 2016-10-29 (×8): qty 1

## 2016-10-29 MED ORDER — LACTATED RINGERS IV SOLN
INTRAVENOUS | Status: DC
  Administered 2016-10-29 (×3): via INTRAVENOUS

## 2016-10-29 MED ORDER — KETOROLAC TROMETHAMINE 30 MG/ML IJ SOLN: 30.0000 mg | Freq: Four times a day (QID) | INTRAMUSCULAR | Status: AC | PRN

## 2016-10-29 MED ORDER — ACETAMINOPHEN 500 MG PO TABS
1000.0000 mg | ORAL_TABLET | Freq: Four times a day (QID) | ORAL | Status: AC
  Administered 2016-10-29 – 2016-10-30 (×3): 1000 mg via ORAL
  Filled 2016-10-29 (×3): qty 2

## 2016-10-29 MED ORDER — DIPHENHYDRAMINE HCL 50 MG/ML IJ SOLN
12.5000 mg | INTRAMUSCULAR | Status: DC | PRN
  Administered 2016-10-29: 12.5 mg via INTRAVENOUS
  Filled 2016-10-29: qty 1

## 2016-10-29 MED ORDER — NALOXONE HCL 0.4 MG/ML IJ SOLN: 0.4000 mg | INTRAMUSCULAR | Status: DC | PRN

## 2016-10-29 MED ORDER — COCONUT OIL OIL
1.0000 "application " | TOPICAL_OIL | Status: DC | PRN
  Filled 2016-10-29 (×2): qty 120

## 2016-10-29 MED ORDER — FENTANYL CITRATE (PF) 100 MCG/2ML IJ SOLN
INTRAMUSCULAR | Status: AC
  Filled 2016-10-29: qty 2

## 2016-10-29 MED ORDER — ONDANSETRON HCL 4 MG/2ML IJ SOLN
INTRAMUSCULAR | Status: DC | PRN
  Administered 2016-10-29: 4 mg via INTRAVENOUS

## 2016-10-29 MED ORDER — KETOROLAC TROMETHAMINE 30 MG/ML IJ SOLN
INTRAMUSCULAR | Status: AC
  Filled 2016-10-29: qty 1

## 2016-10-29 MED ORDER — OXYTOCIN 40 UNITS IN LACTATED RINGERS INFUSION - SIMPLE MED: 2.5000 [IU]/h | INTRAVENOUS | Status: AC

## 2016-10-29 MED ORDER — KETOROLAC TROMETHAMINE 30 MG/ML IJ SOLN
30.0000 mg | Freq: Four times a day (QID) | INTRAMUSCULAR | Status: AC | PRN
  Administered 2016-10-29: 30 mg via INTRAMUSCULAR

## 2016-10-29 MED ORDER — LACTATED RINGERS IV SOLN
INTRAVENOUS | Status: DC
  Administered 2016-10-29: 21:00:00 via INTRAVENOUS

## 2016-10-29 MED ORDER — NALBUPHINE HCL 10 MG/ML IJ SOLN: 5.0000 mg | Freq: Once | INTRAMUSCULAR | Status: DC | PRN

## 2016-10-29 MED ORDER — BUPIVACAINE LIPOSOME 1.3 % IJ SUSP: 20.0000 mL | Freq: Once | INTRAMUSCULAR | Status: DC

## 2016-10-29 MED ORDER — LACTATED RINGERS IV SOLN
INTRAVENOUS | Status: DC | PRN
  Administered 2016-10-29: 10:00:00 via INTRAVENOUS

## 2016-10-29 MED ORDER — SOD CITRATE-CITRIC ACID 500-334 MG/5ML PO SOLN
ORAL | Status: AC
  Filled 2016-10-29: qty 15

## 2016-10-29 MED ORDER — ZOLPIDEM TARTRATE 5 MG PO TABS: 5.0000 mg | ORAL_TABLET | Freq: Every evening | ORAL | Status: DC | PRN

## 2016-10-29 MED ORDER — SODIUM CHLORIDE 0.9% FLUSH: 3.0000 mL | INTRAVENOUS | Status: DC | PRN

## 2016-10-29 MED ORDER — DIPHENHYDRAMINE HCL 25 MG PO CAPS
25.0000 mg | ORAL_CAPSULE | ORAL | Status: DC | PRN
  Filled 2016-10-29: qty 1

## 2016-10-29 MED ORDER — ONDANSETRON HCL 4 MG/2ML IJ SOLN: 4.0000 mg | Freq: Three times a day (TID) | INTRAMUSCULAR | Status: DC | PRN

## 2016-10-29 MED ORDER — ONDANSETRON HCL 4 MG/2ML IJ SOLN
INTRAMUSCULAR | Status: AC
  Filled 2016-10-29: qty 2

## 2016-10-29 MED ORDER — SIMETHICONE 80 MG PO CHEW
80.0000 mg | CHEWABLE_TABLET | ORAL | Status: DC | PRN
  Filled 2016-10-29: qty 1

## 2016-10-29 MED ORDER — TETANUS-DIPHTH-ACELL PERTUSSIS 5-2.5-18.5 LF-MCG/0.5 IM SUSP
0.5000 mL | Freq: Once | INTRAMUSCULAR | Status: AC
  Administered 2016-10-31: 0.5 mL via INTRAMUSCULAR
  Filled 2016-10-29 (×2): qty 0.5

## 2016-10-29 MED ORDER — SOD CITRATE-CITRIC ACID 500-334 MG/5ML PO SOLN: 30.0000 mL | Freq: Once | ORAL | Status: DC

## 2016-10-29 MED ORDER — MORPHINE SULFATE (PF) 0.5 MG/ML IJ SOLN
INTRAMUSCULAR | Status: DC | PRN
  Administered 2016-10-29: .2 mg via INTRATHECAL

## 2016-10-29 MED ORDER — FENTANYL CITRATE (PF) 100 MCG/2ML IJ SOLN: 25.0000 ug | INTRAMUSCULAR | Status: DC | PRN

## 2016-10-29 MED ORDER — MENTHOL 3 MG MT LOZG
1.0000 | LOZENGE | OROMUCOSAL | Status: DC | PRN
  Filled 2016-10-29: qty 9

## 2016-10-29 MED ORDER — WITCH HAZEL-GLYCERIN EX PADS: 1.0000 "application " | MEDICATED_PAD | CUTANEOUS | Status: DC | PRN

## 2016-10-29 MED ORDER — MEPERIDINE HCL 25 MG/ML IJ SOLN: 6.2500 mg | INTRAMUSCULAR | Status: DC | PRN

## 2016-10-29 MED ORDER — SENNOSIDES-DOCUSATE SODIUM 8.6-50 MG PO TABS
2.0000 | ORAL_TABLET | ORAL | Status: DC
  Administered 2016-10-29 – 2016-10-30 (×2): 2 via ORAL
  Filled 2016-10-29 (×4): qty 2

## 2016-10-29 MED ORDER — CEFAZOLIN SODIUM-DEXTROSE 2-4 GM/100ML-% IV SOLN
2.0000 g | INTRAVENOUS | Status: AC
  Administered 2016-10-29: 2 g via INTRAVENOUS
  Filled 2016-10-29: qty 100

## 2016-10-29 MED ORDER — NALOXONE HCL 2 MG/2ML IJ SOSY
1.0000 ug/kg/h | PREFILLED_SYRINGE | INTRAMUSCULAR | Status: DC | PRN
  Filled 2016-10-29: qty 2

## 2016-10-29 MED ORDER — MORPHINE SULFATE (PF) 0.5 MG/ML IJ SOLN
INTRAMUSCULAR | Status: AC
  Filled 2016-10-29: qty 10

## 2016-10-29 MED ORDER — IBUPROFEN 600 MG PO TABS
600.0000 mg | ORAL_TABLET | Freq: Four times a day (QID) | ORAL | Status: DC | PRN
  Administered 2016-10-29: 600 mg via ORAL

## 2016-10-29 MED ORDER — PRENATAL MULTIVITAMIN CH
1.0000 | ORAL_TABLET | Freq: Every day | ORAL | Status: DC
  Administered 2016-10-30: 1 via ORAL
  Filled 2016-10-29 (×4): qty 1

## 2016-10-29 MED ORDER — DIPHENHYDRAMINE HCL 25 MG PO CAPS
25.0000 mg | ORAL_CAPSULE | Freq: Four times a day (QID) | ORAL | Status: DC | PRN
  Filled 2016-10-29: qty 1

## 2016-10-29 MED ORDER — SCOPOLAMINE 1 MG/3DAYS TD PT72
1.0000 | MEDICATED_PATCH | Freq: Once | TRANSDERMAL | Status: DC
  Administered 2016-10-29: 1.5 mg via TRANSDERMAL
  Filled 2016-10-29: qty 1

## 2016-10-29 MED ORDER — OXYTOCIN 10 UNIT/ML IJ SOLN
INTRAVENOUS | Status: DC | PRN
  Administered 2016-10-29: 40 [IU] via INTRAVENOUS

## 2016-10-29 MED ORDER — ACETAMINOPHEN 325 MG PO TABS
650.0000 mg | ORAL_TABLET | ORAL | Status: DC | PRN
  Administered 2016-10-30 – 2016-10-31 (×3): 650 mg via ORAL
  Filled 2016-10-29 (×3): qty 2

## 2016-10-29 MED ORDER — OXYCODONE HCL 5 MG PO TABS: 5.0000 mg | ORAL_TABLET | ORAL | Status: DC | PRN

## 2016-10-29 MED ORDER — PHENYLEPHRINE 8 MG IN D5W 100 ML (0.08MG/ML) PREMIX OPTIME
INJECTION | INTRAVENOUS | Status: DC | PRN
  Administered 2016-10-29: 60 ug/min via INTRAVENOUS

## 2016-10-29 MED ORDER — BUPIVACAINE IN DEXTROSE 0.75-8.25 % IT SOLN
INTRATHECAL | Status: DC | PRN
  Administered 2016-10-29: 1.4 mL via INTRATHECAL

## 2016-10-29 MED ORDER — PHENYLEPHRINE 8 MG IN D5W 100 ML (0.08MG/ML) PREMIX OPTIME
INJECTION | INTRAVENOUS | Status: AC
  Filled 2016-10-29: qty 100

## 2016-10-29 MED ORDER — FENTANYL CITRATE (PF) 100 MCG/2ML IJ SOLN
INTRAMUSCULAR | Status: DC | PRN
  Administered 2016-10-29: 20 ug via INTRATHECAL

## 2016-10-29 MED ORDER — DIBUCAINE 1 % RE OINT
1.0000 "application " | TOPICAL_OINTMENT | RECTAL | Status: DC | PRN
  Filled 2016-10-29: qty 28

## 2016-10-29 MED ORDER — OXYTOCIN 10 UNIT/ML IJ SOLN
INTRAMUSCULAR | Status: AC
  Filled 2016-10-29: qty 4

## 2016-10-29 MED ORDER — SIMETHICONE 80 MG PO CHEW
80.0000 mg | CHEWABLE_TABLET | Freq: Three times a day (TID) | ORAL | Status: DC
  Administered 2016-10-29 – 2016-10-31 (×4): 80 mg via ORAL
  Filled 2016-10-29 (×10): qty 1

## 2016-10-29 SURGICAL SUPPLY — 43 items
ADH SKN CLS APL DERMABOND .7 (GAUZE/BANDAGES/DRESSINGS) ×2
ADH SKN CLS LQ APL DERMABOND (GAUZE/BANDAGES/DRESSINGS) ×1
CHLORAPREP W/TINT 26ML (MISCELLANEOUS) ×4 IMPLANT
CLAMP CORD UMBIL (MISCELLANEOUS) IMPLANT
CLOSURE STERI STRIP 1/2 X4 (GAUZE/BANDAGES/DRESSINGS) ×1 IMPLANT
CLOTH BEACON ORANGE TIMEOUT ST (SAFETY) ×2 IMPLANT
DERMABOND ADHESIVE PROPEN (GAUZE/BANDAGES/DRESSINGS) ×1
DERMABOND ADVANCED (GAUZE/BANDAGES/DRESSINGS) ×2
DERMABOND ADVANCED .7 DNX12 (GAUZE/BANDAGES/DRESSINGS) ×2 IMPLANT
DERMABOND ADVANCED .7 DNX6 (GAUZE/BANDAGES/DRESSINGS) IMPLANT
DRSG OPSITE POSTOP 4X10 (GAUZE/BANDAGES/DRESSINGS) ×2 IMPLANT
ELECT REM PT RETURN 9FT ADLT (ELECTROSURGICAL) ×2
ELECTRODE REM PT RTRN 9FT ADLT (ELECTROSURGICAL) ×1 IMPLANT
EXTRACTOR VACUUM BELL STYLE (SUCTIONS) IMPLANT
GLOVE BIOGEL PI IND STRL 7.0 (GLOVE) ×1 IMPLANT
GLOVE BIOGEL PI IND STRL 8 (GLOVE) ×1 IMPLANT
GLOVE BIOGEL PI INDICATOR 7.0 (GLOVE) ×1
GLOVE BIOGEL PI INDICATOR 8 (GLOVE) ×1
GLOVE ECLIPSE 8.0 STRL XLNG CF (GLOVE) ×2 IMPLANT
GOWN STRL REUS W/TWL LRG LVL3 (GOWN DISPOSABLE) ×4 IMPLANT
KIT ABG SYR 3ML LUER SLIP (SYRINGE) ×2 IMPLANT
NDL HYPO 18GX1.5 BLUNT FILL (NEEDLE) ×1 IMPLANT
NDL HYPO 25X5/8 SAFETYGLIDE (NEEDLE) ×1 IMPLANT
NEEDLE HYPO 18GX1.5 BLUNT FILL (NEEDLE) ×2 IMPLANT
NEEDLE HYPO 22GX1.5 SAFETY (NEEDLE) ×2 IMPLANT
NEEDLE HYPO 25X5/8 SAFETYGLIDE (NEEDLE) ×2 IMPLANT
NS IRRIG 1000ML POUR BTL (IV SOLUTION) ×2 IMPLANT
PACK C SECTION WH (CUSTOM PROCEDURE TRAY) ×2 IMPLANT
PAD OB MATERNITY 4.3X12.25 (PERSONAL CARE ITEMS) ×2 IMPLANT
PENCIL SMOKE EVAC W/HOLSTER (ELECTROSURGICAL) ×2 IMPLANT
RTRCTR C-SECT PINK 25CM LRG (MISCELLANEOUS) IMPLANT
SUT CHROMIC 0 CT 1 (SUTURE) ×2 IMPLANT
SUT MNCRL 0 VIOLET CTX 36 (SUTURE) ×2 IMPLANT
SUT MONOCRYL 0 CTX 36 (SUTURE) ×2
SUT PLAIN 2 0 (SUTURE)
SUT PLAIN 2 0 XLH (SUTURE) IMPLANT
SUT PLAIN ABS 2-0 CT1 27XMFL (SUTURE) IMPLANT
SUT VIC AB 0 CTX 36 (SUTURE) ×2
SUT VIC AB 0 CTX36XBRD ANBCTRL (SUTURE) ×1 IMPLANT
SUT VIC AB 4-0 KS 27 (SUTURE) IMPLANT
SYR 20CC LL (SYRINGE) ×4 IMPLANT
TOWEL OR 17X24 6PK STRL BLUE (TOWEL DISPOSABLE) ×2 IMPLANT
TRAY FOLEY CATH SILVER 14FR (SET/KITS/TRAYS/PACK) IMPLANT

## 2016-10-29 NOTE — Lactation Note (Signed)
This note was copied from a baby's chart. Lactation Consultation Note  Patient Name: Boy Richarda OverlieJessica Dematteo UVOZD'GToday's Date: 10/29/2016 Reason for consult: Follow-up assessment Baby at 8 hr of life. Parents called for latch help on the R side. Mom has small, firm breast, with quarter size areolas and short shaft nipples. Mom was holding baby in cradle position but he was face planting into the breast. Moved dyad to cross cradle and baby was able to maintain short bursts of sucking. Mom denies breast or nipple pain. Baby is sleepy at the breast. Showed parent how to stimulate baby to keep him feeding. Parents are aware of lactation services and support group. They will call as needed.   Maternal Data    Feeding Feeding Type: Breast Fed Length of feed: 15 min  LATCH Score/Interventions Latch: Repeated attempts needed to sustain latch, nipple held in mouth throughout feeding, stimulation needed to elicit sucking reflex. Intervention(s): Adjust position;Assist with latch;Breast compression  Audible Swallowing: A few with stimulation Intervention(s): Hand expression;Skin to skin  Type of Nipple: Everted at rest and after stimulation  Comfort (Breast/Nipple): Soft / non-tender     Hold (Positioning): Full assist, staff holds infant at breast Intervention(s): Position options;Support Pillows  LATCH Score: 6  Lactation Tools Discussed/Used     Consult Status Consult Status: Follow-up Date: 10/30/16 Follow-up type: In-patient    Rulon Eisenmengerlizabeth E Nolan Lasser 10/29/2016, 6:38 PM

## 2016-10-29 NOTE — Lactation Note (Addendum)
This note was copied from a baby's chart. Lactation Consultation Note  Patient Name: Lindsey Richarda OverlieJessica Seng ZOXWR'UToday's [name]: 10/29/2016 Reason for consult: Initial assessment;Other (Comment) (IDDM- Insulin Pump)   Initial consult with mom of 1 hour old infant in PACU. Mom's first infant would not latch, Infant was supplemented early in due to weight loss. Mom reports she pumped and bottle fed for 6 weeks. She reports she had difficulty with blood sugars with her daughter and decided to stop BF.Mom reports + breast changes with pregnancy. Weight and CBG pending.   Infant STS with mom and feeding on the right breast. He fed for 15 minutes and then came off independently. He was then repositioned and latched to left breast in the laid back cross cradle hold. He fed for 15 minutes. Mom attempted to burp him an then he was related to left breast and was still feeding when I left the room. Enc mom to massage/compress breast with feeding. Nipples were rounded when infant came off the breast. Mom denied pain/pinching with feeding.   Mom with small firm breasts with small everted nipples. Attempted to hand express mom, no colostrum was obtained from either breast.   Enc mom to feed infant STS 8-12 x in 24 hours. Enc mom to use head and pillow support with feeding. Enc mom to call out to desk for feeding assistance as needed. Feeding log given with instructions for use.  BF Resources Handout and LC Brochure given, mom informed of IP/OP Services, BF Support Groups and LC phone #. Enc mom to call out to desk for feeding assistance as needed. Mom has a [employer] PIS at home for use. 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    Maternal Data Formula Feeding for Exclusion: No Has patient been taught Hand Expression?: Yes Does the patient have breastfeeding experience prior to this delivery?: Yes  Feeding Feeding Type: Breast Fed Length of feed: 30 min  LATCH Score/Interventions Latch: Grasps breast easily,  tongue down, lips flanged, rhythmical sucking. Intervention(s): Adjust position;Assist with latch;Breast massage;Breast compression  Audible Swallowing: Spontaneous and intermittent  Type of Nipple: Everted at rest and after stimulation  Comfort (Breast/Nipple): Soft / non-tender     Hold (Positioning): Assistance needed to correctly position infant at breast and maintain latch.  LATCH Score: 9  Lactation Tools Discussed/Used WIC Program: No   Consult Status Consult Status: Follow-up [name]: 10/30/16 Follow-up type: In-patient    Lindsey [name] 10/29/2016, 11:50 AM

## 2016-10-29 NOTE — H&P (Signed)
Preoperative History and Physical  Lindsey [name] is a 31 y.o. G2P1001 with Patient's last menstrual period was 01/30/2016 (exact date). [redacted]w[redacted]d Estimated Date of Delivery: 11/05/16 admitted for a primary Caesarean section for suspected fetal macrosomia, Class D DM, fetal vertex out of pelvis.Marland Kitchen  Extensive counseling with pt and husband and we arrived at decision of primary Caesarean section.  PMH:    Past Medical History:  Diagnosis Date  . Diabetes mellitus   . Diabetes mellitus affecting pregnancy in first trimester 03/16/2016  . Pregnant 03/16/2016  . Supervision of other high-risk pregnancy 03/31/2016    [facility] Care at   8+5 weeks FOB  Nola Botkins 31 yo wm second child Dating By  LMP and [place] Pap  03/31/16 GC/CT Initial:                36+wks: Genetic Screen NT/IT:  CF screen  Anatomic [place]  Flu vaccine  Tdap Recommended ~ 28wks Glucose Screen  2 hr GBS  Feed Preference  Contraception  Circumcision  Childbirth Classes  Pediatrician      PSH:     Past Surgical History:  Procedure Laterality Date  . NO PAST SURGERIES      POb/GynH:      OB History    Gravida Para Term Preterm AB Living   2 1 1     1    SAB TAB Ectopic Multiple Live Births           1      Obstetric Comments   Diabetic on insulin pump---------class D      SH:   Social History  Substance Use Topics  . Smoking status: Never Smoker  . Smokeless tobacco: Never Used  . Alcohol use No     Comment: not now!!    FH:    Family History  Problem Relation Age of Onset  . Hypertension Father   . Cancer Maternal Grandmother   . Cancer Maternal Grandfather   . Alzheimer's disease Paternal Grandfather      Allergies: No Known Allergies  Medications:       Current Facility-Administered Medications:  .  ceFAZolin (ANCEF) IVPB 2g/100 mL premix, 2 g, Intravenous, On Call to OR, Lazaro Arms, MD .  lactated ringers infusion, , Intravenous, Continuous, Mal Amabile, MD, Last Rate: 125 mL/hr at  10/29/16 1610 .  scopolamine (TRANSDERM-SCOP) 1 MG/3DAYS 1.5 mg, 1 patch, Transdermal, Once, Mal Amabile, MD, 1.5 mg at 10/29/16 0824 .  sodium citrate-citric acid (ORACIT) 500-334 MG/5ML solution, , , ,  .  sodium citrate-citric acid (ORACIT) solution 30 mL, 30 mL, Oral, Once, Mal Amabile, MD  Review of Systems:   Review of Systems  Constitutional: Negative for fever, chills, weight loss, malaise/fatigue and diaphoresis.  HENT: Negative for hearing loss, ear pain, nosebleeds, congestion, sore throat, neck pain, tinnitus and ear discharge.   Eyes: Negative for blurred vision, double vision, photophobia, pain, discharge and redness.  Respiratory: Negative for cough, hemoptysis, sputum production, shortness of breath, wheezing and stridor.   Cardiovascular: Negative for chest pain, palpitations, orthopnea, claudication, leg swelling and PND.  Gastrointestinal: Positive for abdominal pain. Negative for heartburn, nausea, vomiting, diarrhea, constipation, blood in stool and melena.  Genitourinary: Negative for dysuria, urgency, frequency, hematuria and flank pain.  Musculoskeletal: Negative for myalgias, back pain, joint pain and falls.  Skin: Negative for itching and rash.  Neurological: Negative for dizziness, tingling, tremors, sensory change, speech change, focal weakness, seizures, loss of consciousness, weakness  and headaches.  Endo/Heme/Allergies: Negative for environmental allergies and polydipsia. Does not bruise/bleed easily.  Psychiatric/Behavioral: Negative for depression, suicidal ideas, hallucinations, memory loss and substance abuse. The patient is not nervous/anxious and does not have insomnia.      PHYSICAL EXAM:  Blood pressure 124/78, pulse 72, temperature 98.4 F (36.9 C), temperature source Oral, height 5\' 4"  (1.626 m), weight 195 lb (88.5 kg), last menstrual period 01/30/2016, SpO2 100 %.    Vitals reviewed. Constitutional: She is oriented to person, place, and  time. She appears well-developed and well-nourished.  HENT:  Head: Normocephalic and atraumatic.  Right Ear: External ear normal.  Left Ear: External ear normal.  Nose: Nose normal.  Mouth/Throat: Oropharynx is clear and moist.  Eyes: Conjunctivae and EOM are normal. Pupils are equal, round, and reactive to light. Right eye exhibits no discharge. Left eye exhibits no discharge. No scleral icterus.  Neck: Normal range of motion. Neck supple. No tracheal deviation present. No thyromegaly present.  Cardiovascular: Normal rate, regular rhythm, normal heart sounds and intact distal pulses.  Exam reveals no gallop and no friction rub.   No murmur heard. Respiratory: Effort normal and breath sounds normal. No respiratory distress. She has no wheezes. She has no rales. She exhibits no tenderness.  GI: Soft. Bowel sounds are normal. She exhibits no distension and no mass. There is tenderness. There is no rebound and no guarding.  Genitourinary:       Vulva is normal without lesions Vagina is pink moist without discharge Cervix normal in appearance and pap is normal Uterus is size greater than dates Adnexa is negative with normal sized ovaries by sonogram  Musculoskeletal: Normal range of motion. She exhibits no edema and no tenderness.  Neurological: She is alert and oriented to person, place, and time. She has normal reflexes. She displays normal reflexes. No cranial nerve deficit. She exhibits normal muscle tone. Coordination normal.  Skin: Skin is warm and dry. No rash noted. No erythema. No pallor.  Psychiatric: She has a normal mood and affect. Her behavior is normal. Judgment and thought content normal.    Labs: Results for orders placed or performed during the hospital encounter of 10/29/16 (from the past 336 hour(s))  Glucose, capillary   Collection Time: 10/29/16  8:28 AM  Result Value Ref Range   Glucose-Capillary 146 (H) 65 - 99 mg/dL  Results for orders placed or performed during  the hospital encounter of 10/28/16 (from the past 336 hour(s))  Basic metabolic panel   Collection Time: 10/28/16 10:20 AM  Result Value Ref Range   Sodium 134 (L) 135 - 145 mmol/L   Potassium 3.8 3.5 - 5.1 mmol/L   Chloride 105 101 - 111 mmol/L   CO2 23 22 - 32 mmol/L   Glucose, Bld 140 (H) 65 - 99 mg/dL   BUN 9 6 - 20 mg/dL   Creatinine, Ser 9.14 0.44 - 1.00 mg/dL   Calcium 8.4 (L) 8.9 - 10.3 mg/dL   GFR calc non Af Amer >60 >60 mL/min   GFR calc Af Amer >60 >60 mL/min   Anion gap 6 5 - 15  CBC   Collection Time: 10/28/16 10:20 AM  Result Value Ref Range   WBC 12.5 (H) 4.0 - 10.5 K/uL   RBC 3.88 3.87 - 5.11 MIL/uL   Hemoglobin 10.7 (L) 12.0 - 15.0 g/dL   HCT 78.2 (L) 95.6 - 21.3 %   MCV 79.9 78.0 - 100.0 fL   MCH 27.6 26.0 - 34.0  pg   MCHC 34.5 30.0 - 36.0 g/dL   RDW 46.9 62.9 - 52.8 %   Platelets 250 150 - 400 K/uL  RPR   Collection Time: 10/28/16 10:20 AM  Result Value Ref Range   RPR Ser Ql Non Reactive Non Reactive  Type and screen   Collection Time: 10/28/16 10:20 AM  Result Value Ref Range   ABO/RH(D) O POS    Antibody Screen NEG    Sample Expiration 10/31/2016   ABO/Rh   Collection Time: 10/28/16 10:20 AM  Result Value Ref Range   ABO/RH(D) O POS   Results for orders placed or performed in visit on 10/26/16 (from the past 336 hour(s))  POCT urinalysis dipstick   Collection Time: 10/26/16  2:43 PM  Result Value Ref Range   Color, UA     Clarity, UA     Glucose, UA neg    Bilirubin, UA     Ketones, UA neg    Spec Grav, UA     Blood, UA neg    pH, UA     Protein, UA neg    Urobilinogen, UA     Nitrite, UA neg    Leukocytes, UA Negative Negative  Results for orders placed or performed in visit on 10/22/16 (from the past 336 hour(s))  POCT urinalysis dipstick   Collection Time: 10/22/16  9:09 AM  Result Value Ref Range   Color, UA     Clarity, UA     Glucose, UA neg    Bilirubin, UA     Ketones, UA neg    Spec Grav, UA     Blood, UA neg    pH,  UA     Protein, UA neg    Urobilinogen, UA     Nitrite, UA neg    Leukocytes, UA Negative Negative  Results for orders placed or performed in visit on 10/19/16 (from the past 336 hour(s))  POCT urinalysis dipstick   Collection Time: 10/19/16  9:03 AM  Result Value Ref Range   Color, UA     Clarity, UA     Glucose, UA neg    Bilirubin, UA     Ketones, UA neg    Spec Grav, UA     Blood, UA neg    pH, UA     Protein, UA neg    Urobilinogen, UA     Nitrite, UA neg    Leukocytes, UA Negative Negative  Results for orders placed or performed in visit on 10/15/16 (from the past 336 hour(s))  POCT urinalysis dipstick   Collection Time: 10/15/16  9:03 AM  Result Value Ref Range   Color, UA     Clarity, UA     Glucose, UA neg    Bilirubin, UA     Ketones, UA neg    Spec Grav, UA     Blood, UA neg    pH, UA     Protein, UA neg    Urobilinogen, UA     Nitrite, UA neg    Leukocytes, UA Negative Negative    EKG: No orders found for this or any previous visit.  Imaging Studies: Ct Head Wo Contrast  Result Date: 10/01/2016 CLINICAL DATA:  Fall, occipital head trauma, 8 months pregnant EXAM: CT HEAD WITHOUT CONTRAST TECHNIQUE: Contiguous axial images were obtained from the base of the skull through the vertex without intravenous contrast. COMPARISON:  MRI brain dated 01/08/2012 FINDINGS: Brain: No evidence of acute infarction, hemorrhage, hydrocephalus, extra-axial  collection or mass lesion/mass effect. Vascular: No hyperdense vessel or unexpected calcification. Skull: Normal. Negative for fracture or focal lesion. Sinuses/Orbits: No acute finding. Other: Cerebral volume is within normal limits. No ventriculomegaly. IMPRESSION: Normal head CT. Electronically Signed   By: Charline Bills M.D.   On: 10/01/2016 19:55   US Ob Follow Up  Result Date: 10/26/2016 FOLLOW UP SONOGRAM LEXIA VANDEVENDER is in the office for a follow up sonogram for BPP and EFW. She is a 31 y.o. year old G2P1001  with Estimated Date of Delivery: 11/05/16 by LMP now at  [redacted]w[redacted]d weeks gestation. Thus far the pregnancy has been complicated by diabetes class D DM,LGA. GESTATION: SINGLETON PRESENTATION: cephalic FETAL ACTIVITY:          Heart rate         140          The fetus is active. AMNIOTIC FLUID: The amniotic fluid volume is  normal, 15 cm. PLACENTA LOCALIZATION:  anterior GRADE 2 CERVIX: Limited view ADNEXA: The ovaries are normal. GESTATIONAL AGE AND  BIOMETRICS: Gestational criteria: Estimated Date of Delivery: 11/05/16 by LMP now at [redacted]w[redacted]d Previous Scans:6          BIPARIETAL DIAMETER           9.36 cm         38+1 weeks    66% HEAD CIRCUMFERENCE           33.62 cm         38+4 weeks    33% ABDOMINAL CIRCUMFERENCE           37.17 cm         41+1 weeks     >98% FEMUR LENGTH           7.51 cm         38+3 weeks      51%                                                       AVERAGE EGA(BY THIS SCAN):  39 weeks                                                 ESTIMATED FETAL WEIGHT:       4035  grams, >90 % ANATOMICAL SURVEY                                                                            COMMENTS CEREBRAL VENTRICLES yes normal  CHOROID PLEXUS yes normal                          FACIAL PROFILE yes normal          DIAPHRAGM yes normal  STOMACH yes normal  RENAL REGION yes normal  BLADDER yes normal      3 VESSEL CORD yes normal  GENITALIA yes normal female     SUSPECTED ABNORMALITIES:  yes EFW >90%,AC >98% QUALITY OF SCAN: satisfactory TECHNICIAN COMMENTS: [place] 38+4 wks,cephalic,fhr 40 bpm,BPP 8/8,ant pl gr 2,afi 15 cm,normal ov's bilat,EFW 4035 g >90%,AC >98% A copy of this report including all images has been saved and backed up to a second source for retrieval if needed. All measures and details of the anatomical scan, placentation, fluid volume and pelvic anatomy are contained in that report. Amber Flora Lipps 10/26/2016 2:41 PM Clinical Impression and recommendations: I have reviewed the sonogram results above,  combined with the patient's current clinical course, below are my impressions and any appropriate recommendations for management based on the sonographic findings. 1.  G2P1001 Estimated Date of Delivery: 11/05/16 by serial sonographic evaluations 2.  Fetal sonographic surveillance findings: a). Normal fluid volume b). Normal antepartum fetal assessment with BPP 8/8 C). EFW >90% AC>98% 3.  Normal general sonographic findings Recommend continued prenatal evaluations and care based on this sonogram and as clinically indicated from the patient's clinical course. Lazaro Arms 10/26/2016 3:37 PM   US Ob Follow Up  Result Date: 10/08/2016 FOLLOW UP SONOGRAM SEQUOYAH RAMONE is in the office for a follow up sonogram for EFW and BPP. She is a 31 y.o. year old G2P1001 with Estimated Date of Delivery: 11/05/16 by LMP now at  [redacted]w[redacted]d weeks gestation. Thus far the pregnancy has been complicated by diabetes,large for dates.. GESTATION: SINGLETON PRESENTATION: cephalic FETAL ACTIVITY:          Heart rate         126          The fetus is active. AMNIOTIC FLUID: The amniotic fluid volume is  normal, 20 cm. PLACENTA LOCALIZATION:  anterior GRADE 1 CERVIX: Limited view ADNEXA: wnl GESTATIONAL AGE AND  BIOMETRICS: Gestational criteria: Estimated Date of Delivery: 11/05/16 by LMP now at [redacted]w[redacted]d Previous Scans:5          BIPARIETAL DIAMETER           9.06 cm         36+5 weeks  77% HEAD CIRCUMFERENCE           33.52 cm         38+3 weeks   77% ABDOMINAL CIRCUMFERENCE           36.08 cm         40 weeks       >98% FEMUR LENGTH           7.09 cm         36+2 weeks     55%                                                       AVERAGE EGA(BY THIS SCAN):  37+6 weeks                                                 ESTIMATED FETAL WEIGHT:       3632  grams, >90 % BIOPHYSCIAL PROFILE:  COMMENTS GROSS BODY MOVEMENT                 2  TONE                 2  RESPIRATIONS                2  AMNIOTIC FLUID                2                                                          SCORE:  8/8 (Note: NST was not performed as part of this antepartum testing) ANATOMICAL SURVEY                                                                            COMMENTS CEREBRAL VENTRICLES yes normal  CHOROID PLEXUS yes normal  CEREBELLUM yes normal  CISTERNA MAGNA yes normal  NUCHAL REGION yes normal      NASAL BONE yes normal  NOSE/LIP yes normal  FACIAL PROFILE yes normal  4 CHAMBERED HEART yes normal  OUTFLOW TRACTS yes normal  DIAPHRAGM yes normal  STOMACH yes normal  RENAL REGION yes normal  BLADDER yes normal      3 VESSEL CORD yes normal              GENITALIA   female     SUSPECTED ABNORMALITIES:  yes, AC >98% QUALITY OF SCAN: satisfactory TECHNICIAN COMMENTS: [place] 36 wks,cephalic,ant pl gr 1,bilat adnexa's wnl,AFI 20 cm,fhr 126 bpm,BPP 8/8,EFW 3632 g >90%,AC >98% A copy of this report including all images has been saved and backed up to a second source for retrieval if needed. All measures and details of the anatomical scan, placentation, fluid volume and pelvic anatomy are contained in that report. Amber Flora Lipps 10/08/2016 9:07 AM Clinical Impression and recommendations: I have reviewed the sonogram results above, combined with the patient's current clinical course, below are my impressions and any appropriate recommendations for management based on the sonographic findings. 1.  G2P1001 Estimated Date of Delivery: 11/05/16 by serial sonographic evaluations 2.  Fetal sonographic surveillance findings: a). Normal fluid volume b). Normal antepartum fetal assessment with BPP 8/8 c). Normal fetal Doppler ratios with consistent diastolic flow d). EFW 3632 grams, with disproportionate AC, typical of babies of diabetic moms 3.  Normal general sonographic findings Recommend continued prenatal evaluations and care based on this sonogram and as clinically indicated from the patient's  clinical course. Lazaro Arms 10/08/2016 9:46 AM   US Fetal Bpp W/o Non Stress  Result Date: 10/26/2016 FOLLOW UP SONOGRAM TALLULAH HOSMAN is in the office for a follow up sonogram for BPP and EFW. She is a 31 y.o. year old G2P1001 with Estimated Date of Delivery: 11/05/16 by LMP now at  [redacted]w[redacted]d weeks gestation. Thus far the pregnancy has been complicated by diabetes class D DM,LGA. GESTATION: SINGLETON PRESENTATION: cephalic FETAL ACTIVITY:          Heart rate  140          The fetus is active. AMNIOTIC FLUID: The amniotic fluid volume is  normal, 15 cm. PLACENTA LOCALIZATION:  anterior GRADE 2 CERVIX: Limited view ADNEXA: The ovaries are normal. GESTATIONAL AGE AND  BIOMETRICS: Gestational criteria: Estimated Date of Delivery: 11/05/16 by LMP now at [redacted]w[redacted]d Previous Scans:6          BIPARIETAL DIAMETER           9.36 cm         38+1 weeks    66% HEAD CIRCUMFERENCE           33.62 cm         38+4 weeks    33% ABDOMINAL CIRCUMFERENCE           37.17 cm         41+1 weeks     >98% FEMUR LENGTH           7.51 cm         38+3 weeks      51%                                                       AVERAGE EGA(BY THIS SCAN):  39 weeks                                                 ESTIMATED FETAL WEIGHT:       4035  grams, >90 % ANATOMICAL SURVEY                                                                            COMMENTS CEREBRAL VENTRICLES yes normal  CHOROID PLEXUS yes normal                          FACIAL PROFILE yes normal          DIAPHRAGM yes normal  STOMACH yes normal  RENAL REGION yes normal  BLADDER yes normal      3 VESSEL CORD yes normal              GENITALIA yes normal female     SUSPECTED ABNORMALITIES:  yes EFW >90%,AC >98% QUALITY OF SCAN: satisfactory TECHNICIAN COMMENTS: [place] 38+4 wks,cephalic,fhr 40 bpm,BPP 8/8,ant pl gr 2,afi 15 cm,normal ov's bilat,EFW 4035 g >90%,AC >98% A copy of this report including all images has been saved and backed up to a second source for retrieval if needed. All  measures and details of the anatomical scan, placentation, fluid volume and pelvic anatomy are contained in that report. Amber Flora Lipps 10/26/2016 2:41 PM Clinical Impression and recommendations: I have reviewed the sonogram results above, combined with the patient's current clinical course, below are my impressions and any appropriate recommendations for management based on the sonographic findings. 1.  G2P1001 Estimated Date of Delivery: 11/05/16 by serial sonographic evaluations  2.  Fetal sonographic surveillance findings: a). Normal fluid volume b). Normal antepartum fetal assessment with BPP 8/8 C). EFW >90% AC>98% 3.  Normal general sonographic findings Recommend continued prenatal evaluations and care based on this sonogram and as clinically indicated from the patient's clinical course. Lazaro ArmsURE,LUTHER H 10/26/2016 3:37 PM   [place] Fetal Bpp W/o Non Stress  Result Date: 10/08/2016 FOLLOW UP SONOGRAM Early CharsJessica P Mikolajczak is in the office for a follow up sonogram for EFW and BPP. She is a 31 y.o. year old G2P1001 with Estimated Date of Delivery: 11/05/16 by LMP now at  5257w0d weeks gestation. Thus far the pregnancy has been complicated by diabetes,large for dates.. GESTATION: SINGLETON PRESENTATION: cephalic FETAL ACTIVITY:          Heart rate         126          The fetus is active. AMNIOTIC FLUID: The amniotic fluid volume is  normal, 20 cm. PLACENTA LOCALIZATION:  anterior GRADE 1 CERVIX: Limited view ADNEXA: wnl GESTATIONAL AGE AND  BIOMETRICS: Gestational criteria: Estimated Date of Delivery: 11/05/16 by LMP now at 4057w0d Previous Scans:5          BIPARIETAL DIAMETER           9.06 cm         36+5 weeks  77% HEAD CIRCUMFERENCE           33.52 cm         38+3 weeks   77% ABDOMINAL CIRCUMFERENCE           36.08 cm         40 weeks       >98% FEMUR LENGTH           7.09 cm         36+2 weeks     55%                                                       AVERAGE EGA(BY THIS SCAN):  37+6 weeks                                                  ESTIMATED FETAL WEIGHT:       3632  grams, >90 % BIOPHYSCIAL PROFILE:                                                                                                      COMMENTS GROSS BODY MOVEMENT                 2  TONE                2  RESPIRATIONS  2  AMNIOTIC FLUID                2                                                          SCORE:  8/8 (Note: NST was not performed as part of this antepartum testing) ANATOMICAL SURVEY                                                                            COMMENTS CEREBRAL VENTRICLES yes normal  CHOROID PLEXUS yes normal  CEREBELLUM yes normal  CISTERNA MAGNA yes normal  NUCHAL REGION yes normal      NASAL BONE yes normal  NOSE/LIP yes normal  FACIAL PROFILE yes normal  4 CHAMBERED HEART yes normal  OUTFLOW TRACTS yes normal  DIAPHRAGM yes normal  STOMACH yes normal  RENAL REGION yes normal  BLADDER yes normal      3 VESSEL CORD yes normal              GENITALIA   female     SUSPECTED ABNORMALITIES:  yes, AC >98% QUALITY OF SCAN: satisfactory TECHNICIAN COMMENTS: [place] 36 wks,cephalic,ant pl gr 1,bilat adnexa's wnl,AFI 20 cm,fhr 126 bpm,BPP 8/8,EFW 3632 g >90%,AC >98% A copy of this report including all images has been saved and backed up to a second source for retrieval if needed. All measures and details of the anatomical scan, placentation, fluid volume and pelvic anatomy are contained in that report. Amber Flora Lipps 10/08/2016 9:07 AM Clinical Impression and recommendations: I have reviewed the sonogram results above, combined with the patient's current clinical course, below are my impressions and any appropriate recommendations for management based on the sonographic findings. 1.  G2P1001 Estimated Date of Delivery: 11/05/16 by serial sonographic evaluations 2.  Fetal sonographic surveillance findings: a). Normal fluid volume b). Normal antepartum fetal assessment with BPP 8/8 c). Normal fetal Doppler ratios with consistent diastolic  flow d). EFW 3632 grams, with disproportionate AC, typical of babies of diabetic moms 3.  Normal general sonographic findings Recommend continued prenatal evaluations and care based on this sonogram and as clinically indicated from the patient's clinical course. Lazaro Arms 10/08/2016 9:46 AM      Assessment: [redacted]w[redacted]d Estimated Date of Delivery: 11/05/16 Class D DM, well controlled EFW 4000+ grams Fetal vertex is out of pelvis Patient Active Problem List   Diagnosis Date Noted  . Rubella non-immune status, antepartum 10/06/2016  . Supervision of high-risk pregnancy, third trimester 03/31/2016  . Diabetes mellitus affecting pregnancy, third trimester 03/16/2016    Plan: Primary Caesarean section, pt declines tubal ligation  EURE,LUTHER H 10/29/2016 8:35 AM

## 2016-10-29 NOTE — Op Note (Signed)
Preoperative diagnosis:  1.  Intrauterine pregnancy at 3739 0/[redacted] weeks gestation                                         2.  Fetal macrosomia                                         3.  Elective primary cesarean section   Postoperative diagnosis:  Same as above   Procedure:  Primary cesarean section  Surgeon:  Lazaro ArmsLuther H Eure MD  Assistant:  Jen MowElizabeth Mumaw, DO  Anesthesia: Spinal  Findings:  Over a low transverse incision was delivered a viable female with Apgars of 8 and 9, weight is pending, cord pH 7.251. Uterus, tubes and ovaries were all normal.  There were no other significant findings  Description of operation:  Patient was taken to the operating room and placed in the sitting position where she underwent a spinal anesthetic. She was then placed in the supine position with tilt to the left side. When adequate anesthetic level was obtained she was prepped and draped in usual sterile fashion and a Foley catheter was placed. A Pfannenstiel skin incision was made and carried down sharply to the rectus fascia which was scored in the midline extended laterally. The fascia was taken off the muscles both superiorly and without difficulty. The muscles were divided.  The peritoneal cavity was entered.  Bladder blade was placed, no bladder flap was created.  A low transverse hysterotomy incision was made and delivered a viable female  infant in cephalic position assisted with vacuum extraction using appropriate suction and gentle traction. Apgars of 8 and 9, weight is pending.  Cord pH was obtained and was 7.251. The uterus was exteriorized. It was closed in 2 layers, the first being a running interlocking layer and the second being an imbricating layer using 0 monocryl on a CTX needle. There was good resulting hemostasis. The uterus tubes and ovaries were all normal. Peritoneal cavity was irrigated vigorously. The muscles and peritoneum were reapproximated loosely. The fascia was closed using 0 Vicryl in  running fashion. Subcutaneous tissue was made hemostatic and irrigated. The skin was closed using 4-0 Vicryl on a Keith needle in a subcuticular fashion.  Dermabond was placed for additional wound integrity and to serve as a barrier. Blood loss for the procedure was 600 cc. The patient received a gram of Ancef prophylactically. The patient was taken to the recovery room in good stable condition with all counts being correct x3.  EBL: 600 cc IVF: 3000 cc LR URINE: 250 cc clear yellow   Jen MowElizabeth Mumaw, DO  OB Fellow 10/29/2016 10:25 AM

## 2016-10-29 NOTE — Lactation Note (Signed)
This note was copied from a baby's chart. Lactation Consultation Note  Patient Name: Lindsey [name] VWUJW'JToday's Date: 10/29/2016 Reason for consult: Follow-up assessment Baby at 5 hr of life. Upon entry mom stated that baby had just come off the breast about 10 minutes ago. Dad stated "he latched and went to town". Mom denies breast or nipple pain. She is concerned about latch because her older child struggled with latching. Discussed baby behavior, feeding frequency, baby belly size, voids, wt loss, breast changes, and nipple care. She stated she can manually express and has a spoon in the room. Given lactation handouts. Aware of OP services and support group. Parents will call as needed.     Maternal Data    Feeding Feeding Type: Breast Fed Length of feed: 20 min  LATCH Score/Interventions                      Lactation Tools Discussed/Used Tools: Other (comment) (spoon)   Consult Status Consult Status: Follow-up Date: 10/30/16 Follow-up type: In-patient    [name] 10/29/2016, 3:40 PM

## 2016-10-29 NOTE — Anesthesia Postprocedure Evaluation (Signed)
Anesthesia Post Note  Patient: Lindsey [name]  Procedure(s) Performed: Procedure(s) (LRB): PRIMARY CESAREAN SECTION (N/A)  Patient location during evaluation: PACU Anesthesia Type: Spinal Level of consciousness: awake and alert and oriented Pain management: pain level controlled Vital Signs Assessment: post-procedure vital signs reviewed and stable Respiratory status: spontaneous breathing, nonlabored ventilation and respiratory function stable Cardiovascular status: blood pressure returned to baseline and stable Postop Assessment: no signs of nausea or vomiting, no backache, no headache, spinal receding and patient able to bend at knees Anesthetic complications: no        Last Vitals:  Vitals:   10/29/16 1116 10/29/16 1145  BP: 112/60   Pulse:    Temp:  36.9 C    Last Pain:  Vitals:   10/29/16 1145  TempSrc: Oral   Pain Goal:                 Leonia Heatherly A.

## 2016-10-29 NOTE — Transfer of Care (Signed)
Immediate Anesthesia Transfer of Care Note  Patient: Lindsey [name]  Procedure(s) Performed: Procedure(s): PRIMARY CESAREAN SECTION (N/A)  Patient Location: PACU  Anesthesia Type:Spinal  Level of Consciousness: awake and alert   Airway & Oxygen Therapy: Patient Spontanous Breathing  Post-op Assessment: Report given to RN and Post -op Vital signs reviewed and stable  Post vital signs: Reviewed  Last Vitals:  Vitals:   10/29/16 0754  BP: 124/78  Pulse: 72  Temp: 36.9 C    Last Pain:  Vitals:   10/29/16 0754  TempSrc: Oral         Complications: No apparent anesthesia complications

## 2016-10-29 NOTE — Progress Notes (Signed)
Spoke with patient regarding insulin pump and current settings.  Patient stated she spoke with Dr. Despina HiddenEure and Dr. Malen GauzeFoster and they all three agree with her managing her insulin pump at the settings she has it set to now.  They are as follows:  67% of her basal  Rates 12am-2am: 1.6 units 2am-9am: 1.85 units 9am-2pm: 1.6 units 2pm-6pm: 1.65 units 6pm-9pm: 1.8 units 9pm-12am: 1.75 units  Londin Antone D

## 2016-10-29 NOTE — Anesthesia Preprocedure Evaluation (Addendum)
Anesthesia Evaluation  Patient identified by MRN, date of birth, ID band Patient awake    Reviewed: Allergy & Precautions, NPO status , Patient's Chart, lab work & pertinent test results  Airway Mallampati: III  TM Distance: >3 FB Neck ROM: Full    Dental no notable dental hx. (+) Teeth Intact   Pulmonary neg pulmonary ROS,    Pulmonary exam normal breath sounds clear to auscultation       Cardiovascular negative cardio ROS Normal cardiovascular exam Rhythm:Regular Rate:Normal     Neuro/Psych negative neurological ROS  negative psych ROS   GI/Hepatic Neg liver ROS, GERD  ,  Endo/Other  diabetes, Well Controlled, Type 1, Insulin DependentObesity  Renal/GU negative Renal ROS     Musculoskeletal   Abdominal (+) + obese,   Peds  Hematology  (+) anemia ,   Anesthesia Other Findings   Reproductive/Obstetrics (+) Pregnancy Suspected fetal macrosomia                              Chemistry      Component Value Date/Time   NA 134 (L) 10/28/2016 1020   K 3.8 10/28/2016 1020   CL 105 10/28/2016 1020   CO2 23 10/28/2016 1020   BUN 9 10/28/2016 1020   CREATININE 0.74 10/28/2016 1020      Component Value Date/Time   CALCIUM 8.4 (L) 10/28/2016 1020     Lab Results  Component Value Date   WBC 12.5 (H) 10/28/2016   HGB 10.7 (L) 10/28/2016   HCT 31.0 (L) 10/28/2016   MCV 79.9 10/28/2016   PLT 250 10/28/2016    Anesthesia Physical Anesthesia Plan  ASA: II  Anesthesia Plan: Spinal   Post-op Pain Management:    Induction:   Airway Management Planned: Natural Airway  Additional Equipment:   Intra-op Plan:   Post-operative Plan:   Informed Consent: I have reviewed the patients History and Physical, chart, labs and discussed the procedure including the risks, benefits and alternatives for the proposed anesthesia with the patient or authorized representative who has indicated his/her  understanding and acceptance.   Dental advisory given  Plan Discussed with: CRNA, Anesthesiologist and Surgeon  Anesthesia Plan Comments:         Anesthesia Quick Evaluation

## 2016-10-29 NOTE — Anesthesia Postprocedure Evaluation (Signed)
Anesthesia Post Note  Patient: Lindsey [name]  Procedure(s) Performed: Procedure(s) (LRB): PRIMARY CESAREAN SECTION (N/A)  Patient location during evaluation: Mother Baby Anesthesia Type: Spinal Level of consciousness: awake and alert Pain management: pain level controlled Vital Signs Assessment: post-procedure vital signs reviewed and stable Respiratory status: spontaneous breathing Cardiovascular status: stable Postop Assessment: no headache, no backache, spinal receding, no signs of nausea or vomiting, patient able to bend at knees and adequate PO intake Anesthetic complications: no        Last Vitals:  Vitals:   10/29/16 1207 10/29/16 1320  BP: 118/73 119/64  Pulse: 66 63  Resp: 16 18  Temp: 36.8 C 36.8 C    Last Pain:  Vitals:   10/29/16 1320  TempSrc: Oral  PainSc: 3    Pain Goal: Patients Stated Pain Goal: 3 (10/29/16 1320)               Nakita Santerre Hristova

## 2016-10-29 NOTE — Addendum Note (Signed)
Addendum  created 10/29/16 1456 by Elgie CongoNataliya H Ammarie Matsuura, CRNA   Sign clinical note

## 2016-10-30 LAB — GLUCOSE, CAPILLARY
Glucose-Capillary: 71 mg/dL (ref 65–99)
Glucose-Capillary: 106 mg/dL — ABNORMAL HIGH (ref 65–99)
Glucose-Capillary: 34 mg/dL — CL (ref 65–99)
Glucose-Capillary: 97 mg/dL (ref 65–99)
Glucose-Capillary: 77 mg/dL (ref 65–99)

## 2016-10-30 LAB — CBC
HCT: 24.5 % — ABNORMAL LOW (ref 36.0–46.0)
Hemoglobin: 8.6 g/dL — ABNORMAL LOW (ref 12.0–15.0)
MCH: 28.3 pg (ref 26.0–34.0)
MCHC: 35.1 g/dL (ref 30.0–36.0)
MCV: 80.6 fL (ref 78.0–100.0)
Platelets: 212 10*3/uL (ref 150–400)
RBC: 3.04 MIL/uL — ABNORMAL LOW (ref 3.87–5.11)
RDW: 13.7 % (ref 11.5–15.5)
WBC: 14.4 10*3/uL — ABNORMAL HIGH (ref 4.0–10.5)

## 2016-10-30 LAB — HEMOGLOBIN A1C
Hgb A1c MFr Bld: 5.9 % — ABNORMAL HIGH (ref 4.8–5.6)
Mean Plasma Glucose: 123 mg/dL

## 2016-10-30 NOTE — Progress Notes (Addendum)
Night Rn called Dr.Schenk regarding patient's Blood sugar of 34 during the day (1224) and that patient took her own sugar pill. Dr. Genevie AnnSchenk will discuss protocol for taking home meds with the patient. At 1720, patient's blood sugar was 97. DR Genevie AnnSchenk stated he would come see the patient..Marland Kitchen

## 2016-10-30 NOTE — Progress Notes (Signed)
POSTPARTUM PROGRESS NOTE  Post Op Day 1 Subjective:  Lindsey CharsJessica P [name] is a 31 y.o. G2P2002 10937w0d s/p PLTCS.  No acute events overnight.  Pt denies problems with ambulating, voiding or po intake.  She denies nausea or vomiting.  Pain is well controlled.  She has not had flatus. She has not had bowel movement.  Lochia Minimal.   Objective: Blood pressure 114/63, pulse (!) 59, temperature 97.9 F (36.6 C), temperature source Oral, resp. rate 18, height 5\' 4"  (1.626 m), weight 88.5 kg (195 lb), last menstrual period 01/30/2016, SpO2 100 %, unknown if currently breastfeeding.  Physical Exam:  General: alert, cooperative and no distress Lochia:normal flow Chest: CTAB Heart: RRR no m/r/g Abdomen: +BS, soft, nontender Uterine Fundus:soft DVT Evaluation: No calf swelling or tenderness Extremities: no edema   Recent Labs  10/28/16 1020 10/30/16 0537  HGB 10.7* 8.6*  HCT 31.0* 24.5*    Assessment/Plan:  ASSESSMENT: Lindsey [name] is a 31 y.o. G2P2002 4437w0d s/p PLTCS.  Doing well, not passing flatus yet. Continue to encourage ambulation.   Plan for discharge tomorrow   LOS: 1 day   Renne Muscaaniel L Warden, MD PGY-1 [facility]  10/30/2016, 7:27 AM   OB FELLOW POSTPARTUM PROGRESS NOTE ATTESTATION  I have seen and examined this patient and agree with above documentation in the resident's note.   Lindsey PennaNicholas Schenk, MD 9:59 AM

## 2016-10-30 NOTE — Lactation Note (Signed)
This note was copied from a baby's chart. Lactation Consultation Note  Assisted mother with latching infant and identifying swallows.  His suck swallow ratios varied but over all many swallows were noted.  Mom taught breast compression to aid in transfer.  She reports that with her first baby she, a type 1 diabetic, had difficulty maintaining euglycemia. Encouraged her to have a snack prepared for herself when feeding the baby. She was thirsty after this feeding so water was given to her.  Patient Name: Boy Richarda OverlieJessica Arreaga VHQIO'NToday's Date: 10/30/2016 Reason for consult: Follow-up assessment   Maternal Data    Feeding Feeding Type: Breast Fed  LATCH Score/Interventions Latch: Repeated attempts needed to sustain latch, nipple held in mouth throughout feeding, stimulation needed to elicit sucking reflex.  Audible Swallowing: A few with stimulation  Type of Nipple: Everted at rest and after stimulation  Comfort (Breast/Nipple): Soft / non-tender     Hold (Positioning): Assistance needed to correctly position infant at breast and maintain latch.  LATCH Score: 7  Lactation Tools Discussed/Used     Consult Status Consult Status: Follow-up Date: 10/31/16 Follow-up type: In-patient    Soyla DryerJoseph, Kylene Zamarron 10/30/2016, 2:51 PM

## 2016-10-30 NOTE — Lactation Note (Signed)
This note was copied from a baby's chart. Lactation Consultation Note  Mom was not in room but dad reported baby had been latching well but would not latch when it was "time" to feed,  He reported baby was not interested. Reassured him that baby may not have been hungry. Left phone number for mother to call when assistance was needed.  Patient Name: Boy Richarda OverlieJessica Fidler ZOXWR'UToday's Date: 10/30/2016     Maternal Data    Feeding    LATCH Score/Interventions                      Lactation Tools Discussed/Used     Consult Status      Soyla DryerJoseph, Mette [place]gate 10/30/2016, 1:50 PM

## 2016-10-31 LAB — GLUCOSE, CAPILLARY
Glucose-Capillary: 140 mg/dL — ABNORMAL HIGH (ref 65–99)
Glucose-Capillary: 160 mg/dL — ABNORMAL HIGH (ref 65–99)

## 2016-10-31 MED ORDER — DOCUSATE SODIUM 100 MG PO CAPS: 100.0000 mg | ORAL_CAPSULE | Freq: Two times a day (BID) | ORAL | 0 refills | Status: DC

## 2016-10-31 MED ORDER — OXYCODONE-ACETAMINOPHEN 5-325 MG PO TABS: 1.0000 | ORAL_TABLET | ORAL | 0 refills | Status: DC | PRN

## 2016-10-31 MED ORDER — IBUPROFEN 600 MG PO TABS: 600.0000 mg | ORAL_TABLET | Freq: Four times a day (QID) | ORAL | 0 refills | Status: DC | PRN

## 2016-10-31 MED ORDER — MEASLES, MUMPS & RUBELLA VAC ~~LOC~~ INJ
0.5000 mL | INJECTION | Freq: Once | SUBCUTANEOUS | Status: AC
  Administered 2016-10-31: 0.5 mL via SUBCUTANEOUS
  Filled 2016-10-31: qty 0.5

## 2016-10-31 NOTE — Lactation Note (Signed)
This note was copied from a baby's chart. Lactation Consultation Note: Baby just had 7 ml EBM by finger feeding with curved tip syringe. He is asleep in mom's arms at present. Has Medela pump for home. Reviewed our phone number, OP appointments and BFSG as resources for support after DC. No questions at present. To call prn  Patient Name: Boy Richarda OverlieJessica Holben [id number]'s Date: 10/31/2016 Reason for consult: Follow-up assessment   Maternal Data Formula Feeding for Exclusion: No Has patient been taught Hand Expression?: Yes Does the patient have breastfeeding experience prior to this delivery?: Yes  Feeding LATCH Score/Interventions  Lactation Tools Discussed/Used WIC Program: No Pump Review: Setup, frequency, and cleaning Initiated by:: DW Date initiated:: 10/31/16   Consult Status Consult Status: Follow-up Date: 10/31/16 Follow-up type: In-patient    Pamelia HoitWeeks, Reet Scharrer D 10/31/2016, 12:05 PM

## 2016-10-31 NOTE — Discharge Summary (Signed)
OB Discharge Summary     Patient Name: Lindsey [name] DOB: [birth year] MRN: [id number]  Date of admission: 10/29/2016 Delivering MD: Florian Buff   Date of discharge: 10/31/2016  Admitting diagnosis: primary  Fetal macrosomia Intrauterine pregnancy: [redacted]w[redacted]d    Secondary diagnosis:  Active Problems:   Status post primary low transverse cesarean section  Additional problems: Type I diabetic with insulin pump     Discharge diagnosis: Term Pregnancy Delivered and Type I diabetes with insulin pump                                                                                             Post partum procedures:MMR vaccine  Augmentation: None  Complications: None  Hospital course:  Sceduled C/S   31y.o. yo G2P2002 at 336w0das admitted to the hospital 10/29/2016 for scheduled cesarean section with the following indication:Macrosomia and Elective Primary.  Membrane Rupture Time/Date: 9:43 AM ,10/29/2016   Patient delivered a Viable infant.10/29/2016  Details of operation can be found in separate operative note.  Pateint had an uncomplicated postpartum course.  She is ambulating, tolerating a regular diet, passing flatus, and urinating well. Patient is discharged home in stable condition on  10/31/16         Physical exam  Vitals:   10/30/16 0338 10/30/16 0800 10/30/16 1733 10/31/16 0620  BP: 114/63 121/69 121/69 134/75  Pulse: (!) 59 70 90 85  Resp: 18 18 18 18   Temp: 97.9 F (36.6 C) 98.8 F (37.1 C) 97.9 F (36.6 C) 98.3 F (36.8 C)  TempSrc: Oral Oral Oral Oral  SpO2: 100% 99%    Weight:      Height:       General: alert, cooperative and no distress Lochia: appropriate Uterine Fundus: firm Incision: Healing well with no significant drainage, No significant erythema, Dressing is clean, dry, and intact DVT Evaluation: No evidence of DVT seen on physical exam. Negative Homan's sign. No cords or calf tenderness. Labs: Lab Results  Component Value Date   WBC 14.4 (H)  10/30/2016   HGB 8.6 (L) 10/30/2016   HCT 24.5 (L) 10/30/2016   MCV 80.6 10/30/2016   PLT 212 10/30/2016   CMP Latest Ref Rng & Units 10/28/2016  Glucose 65 - 99 mg/dL 140(H)  BUN 6 - 20 mg/dL 9  Creatinine 0.44 - 1.00 mg/dL 0.74  Sodium 135 - 145 mmol/L 134(L)  Potassium 3.5 - 5.1 mmol/L 3.8  Chloride 101 - 111 mmol/L 105  CO2 22 - 32 mmol/L 23  Calcium 8.9 - 10.3 mg/dL 8.4(L)    Discharge instruction: per After Visit Summary and "Baby and Me Booklet".  After visit meds:  Allergies as of 10/31/2016   No Known Allergies     Medication List    TAKE these medications   cetirizine 10 MG tablet Commonly known as:  ZYRTEC Take 10 mg by mouth daily.   docusate sodium 100 MG capsule Commonly known as:  COLACE Take 1 capsule (100 mg total) by mouth 2 (two) times daily.   FERRALET 90 90-1 MG Tabs Take 1 capsule by mouth daily.  ibuprofen 600 MG tablet Commonly known as:  ADVIL,MOTRIN Take 1 tablet (600 mg total) by mouth every 6 (six) hours as needed for mild pain.   insulin lispro 100 UNIT/ML injection Commonly known as:  HUMALOG Inject into the skin. Use up to 100 units per day as directed in insulin pump   multivitamin-prenatal 27-0.8 MG Tabs tablet Take 1 tablet by mouth daily at 12 noon.   oxyCODONE-acetaminophen 5-325 MG tablet Commonly known as:  ROXICET Take 1-2 tablets by mouth every 4 (four) hours as needed for severe pain.       Diet: carb modified diet  Activity: Advance as tolerated. Pelvic rest for 6 weeks.   Outpatient follow up:1 week for incision check; 6 weeks for postpartum exam Follow up Appt:Future Appointments Date Time Provider [place]  11/09/2016 1:30 PM Florian Buff, MD [facility]   Follow up Visit:No Follow-up on file.  Postpartum contraception: Vasectomy  Newborn Data: Live born female  Birth Weight: 8 lb 13.3 oz (4005 g) APGAR: 8, 9  Baby Feeding: Breast Disposition:home with mother   10/31/2016 Katherine Basset, DO OB Fellow

## 2016-10-31 NOTE — Discharge Instructions (Signed)

## 2016-10-31 NOTE — Lactation Note (Signed)
This note was copied from a baby's chart. Lactation Consultation Note: Mom attempting to latch baby to the breast when I went into room. Reports baby has been sleepy through the night. Reports he latched on well yesterday.  RN gave her a nipple shield to try during the night.  Attempted to wake baby, then nursery came in for baby to do circ.  Mom reports breasts are beginning to fill. Has  Medela  Pump for home. Manual pump given with instructions for setup, use and cleaning of pump pieces  Reviewed engorgement prevention and treatment.  No questions at present. Encouraged to call for assist when baby back in room and ready for feeding.   Patient Name: Boy Richarda OverlieJessica Delmont ZOXWR'UToday's Date: 10/31/2016 Reason for consult: Follow-up assessment   Maternal Data Formula Feeding for Exclusion: No Has patient been taught Hand Expression?: Yes Does the patient have breastfeeding experience prior to this delivery?: Yes  Feeding Feeding Type: Breast Fed  LATCH Score/Interventions Latch: Too sleepy or reluctant, no latch achieved, no sucking elicited.  Audible Swallowing: None  Type of Nipple: Everted at rest and after stimulation (short nipples)  Comfort (Breast/Nipple): Filling, red/small blisters or bruises, mild/mod discomfort  Problem noted: Filling  Hold (Positioning): Assistance needed to correctly position infant at breast and maintain latch. Intervention(s): Breastfeeding basics reviewed  LATCH Score: 4  Lactation Tools Discussed/Used WIC Program: No Pump Review: Setup, frequency, and cleaning Initiated by:: DW Date initiated:: 10/31/16   Consult Status Consult Status: Follow-up Date: 10/31/16 Follow-up type: In-patient    Pamelia HoitWeeks, Haylie Mccutcheon D 10/31/2016, 9:43 AM

## 2016-11-09 ENCOUNTER — Encounter: Payer: Self-pay | Admitting: Obstetrics & Gynecology

## 2016-11-09 ENCOUNTER — Ambulatory Visit (INDEPENDENT_AMBULATORY_CARE_PROVIDER_SITE_OTHER): Payer: 59 | Admitting: Obstetrics & Gynecology

## 2016-11-09 VITALS — BP 120/80 | HR 76 | Wt 180.0 lb

## 2016-11-09 DIAGNOSIS — Z98891 History of uterine scar from previous surgery: Secondary | ICD-10-CM

## 2016-11-09 DIAGNOSIS — Z9889 Other specified postprocedural states: Secondary | ICD-10-CM

## 2016-11-09 NOTE — Progress Notes (Signed)
  HPI: Patient returns for routine postoperative follow-up having undergone primary Caesarean section on 10/29/2016.  The patient's immediate postoperative recovery has been unremarkable. Since hospital discharge the patient reports no problems, CBG are excellent   Current Outpatient Prescriptions: cetirizine (ZYRTEC) 10 MG tablet, Take 10 mg by mouth daily., Disp: , Rfl:  insulin lispro (HUMALOG) 100 UNIT/ML injection, Inject into the skin. Use up to 100 units per day as directed in insulin pump, Disp: , Rfl:  Prenatal Vit-Fe Fumarate-FA (MULTIVITAMIN-PRENATAL) 27-0.8 MG TABS tablet, Take 1 tablet by mouth daily at 12 noon., Disp: , Rfl:  Fe Cbn-Fe Gluc-FA-B12-C-DSS (FERRALET 90) 90-1 MG TABS, Take 1 capsule by mouth daily., Disp: 30 each, Rfl: 11  No current facility-administered medications for this visit.     Blood pressure 120/80, pulse 76, weight 180 lb (81.6 kg), last menstrual period 01/30/2016, unknown if currently breastfeeding.  Physical Exam: Incision clean dry intact Abdomen soft normal post op  Diagnostic Tests:   Pathology:   Impression: S/p Primary Caesarean section for suspected fetal macrosomia  Plan: Routine post op care  Follow up: 4  weeks  Lazaro ArmsEURE,Bernestine Holsapple H, MD

## 2016-11-10 ENCOUNTER — Encounter: Payer: Self-pay | Admitting: Obstetrics & Gynecology

## 2016-11-21 NOTE — Progress Notes (Signed)
This encounter was created in error - please disregard.  This encounter was created in error - please disregard.

## 2016-11-29 MED FILL — CONTOUR NEXT STRIPS: 90 days supply | Qty: 600 | Fill #2

## 2016-11-29 MED FILL — HumaLOG 100 UNIT/ML SOLN: 100 | 90 days supply | Qty: 90 | Fill #2

## 2016-12-07 ENCOUNTER — Ambulatory Visit (INDEPENDENT_AMBULATORY_CARE_PROVIDER_SITE_OTHER): Payer: 59 | Admitting: Obstetrics & Gynecology

## 2016-12-07 ENCOUNTER — Encounter: Payer: Self-pay | Admitting: Obstetrics & Gynecology

## 2016-12-07 NOTE — Progress Notes (Signed)
Subjective:     Early CharsJessica P [name] is a [age] y.o. female who presents for a postpartum visit. She is 5 weeks postpartum following a low cervical transverse Cesarean section. I have fully reviewed the prenatal and intrapartum course. The delivery was at 39  gestational weeks. Outcome: primary cesarean section, low transverse incision. Anesthesia: spinal. Postpartum course has been unremarkable. Baby's course has been normal. Baby is feeding by bottle. Bleeding she has started a period. Bowel function is normal. Bladder function is normal. Patient is not sexually active. Contraception method is vasectomy. Postpartum depression screening: negative.  The following portions of the patient's history were reviewed and updated as appropriate: allergies, current medications, past family history, past medical history, past social history, past surgical history and problem list.  Review of Systems Pertinent items are noted in HPI.   Objective:    BP 110/60 (BP Location: Left Arm, Patient Position: Sitting, Cuff Size: Normal)   Pulse 78   Ht 5\' 4"  (1.626 m)   Wt 179 lb (81.2 kg)   LMP 12/03/2016 (Exact Date)   Breastfeeding? No   BMI 30.73 kg/m   General:  alert, cooperative and no distress   Breasts:    Lungs:   Heart:    Abdomen: soft, non-tender; bowel sounds normal; no masses,  no organomegaly   Vulva:  normal  Vagina: normal vagina, no discharge, exudate, lesion, or erythema  Cervix:  no cervical motion tenderness and no lesions  Corpus: normal size, contour, position, consistency, mobility, non-tender  Adnexa:  normal ovaries palpated bilaterally  Rectal Exam:         Assessment:     normal postpartum exam. Pap smear not done at today's visit.   Plan:    1. Contraception: vasectomy2.  3. Follow up in: 1 year or as needed.

## 2016-12-09 DIAGNOSIS — E109 Type 1 diabetes mellitus without complications: Secondary | ICD-10-CM | POA: Diagnosis not present

## 2016-12-09 DIAGNOSIS — Z794 Long term (current) use of insulin: Secondary | ICD-10-CM | POA: Diagnosis not present

## 2016-12-09 DIAGNOSIS — Z9641 Presence of insulin pump (external) (internal): Secondary | ICD-10-CM | POA: Diagnosis not present

## 2017-01-12 ENCOUNTER — Other Ambulatory Visit: Payer: Self-pay | Admitting: *Deleted

## 2017-01-12 ENCOUNTER — Encounter: Payer: Self-pay | Admitting: *Deleted

## 2017-01-12 VITALS — BP 102/64 | Ht 64.0 in | Wt 175.0 lb

## 2017-01-12 DIAGNOSIS — E109 Type 1 diabetes mellitus without complications: Secondary | ICD-10-CM

## 2017-01-12 LAB — POCT GLYCOSYLATED HEMOGLOBIN (HGB A1C): Hemoglobin A1C: 6.5

## 2017-01-12 NOTE — Patient Outreach (Signed)
[facility]) Care Management   01/12/2017  MAKESHIA SEAT 12-18-1985 [id number]  Lindsey [name] is an 31 y.o. female who presents to the [facility] [employer] Care Management office for routine Link To Wellness follow up for self management assistance with Type I DM.  Subjective: [name] says she is doing well since having her second child via C section on 10/29/16. She says she breast fed for a week and stopped due to glucose lability.  She says she continues to wear the 670 G Medtronic closed loop system and it remains in the manual mode until she sees her provider again. She says she is not using the Guardian sensor because it alarmed frequently especially at night and severely interrupted her sleep.   Objective:   Review of Systems  Constitutional: Negative.     Physical Exam  Constitutional: She is oriented to person, place, and time. She appears well-developed and well-nourished.  Respiratory: Effort normal.  Neurological: She is alert and oriented to person, place, and time.  Skin: Skin is warm and dry.  Psychiatric: She has a normal mood and affect. Her behavior is normal. Judgment and thought content normal.   Vitals:   01/12/17 0915  BP: 102/64   POC Hgb A1C= 6.5% POC fasting CBG= 58 treated with 1 pack Skittles Weight= 175 lbs (stated)   Encounter Medications:   Outpatient Encounter Prescriptions as of 01/12/2017  Medication Sig Note  . cetirizine (ZYRTEC) 10 MG tablet Take 10 mg by mouth daily.   . insulin lispro (HUMALOG) 100 UNIT/ML injection Inject into the skin. Use up to 100 units per day as directed in insulin pump 10/27/2016: Bolus: From 12 -7 am 1 unit per 6g of carbs, from 7am -11am 1 unit per 5.5g of carbs, from 11am to 5pm 1 unit per 6g of carbs, 5-9pm 1 unit per 5.5g of carbs, and 9pm -12am 1 unit per 10g of carbs.    No facility-administered encounter medications on file as of 01/12/2017.     Functional Status:   In your  present state of health, do you have any difficulty performing the following activities: 01/12/2017 10/29/2016  Hearing? N N  Vision? N N  Difficulty concentrating or making decisions? N N  Walking or climbing stairs? N N  Dressing or bathing? N N  Doing errands, shopping? N -  Preparing Food and eating ? - -  Using the Toilet? - -  In the past six months, have you accidently leaked urine? - -  Do you have problems with loss of bowel control? - -  Managing your Medications? - -  Managing your Finances? - -  Housekeeping or managing your Housekeeping? - -  Some recent data might be hidden    Fall/Depression Screening:    PHQ 2/9 Scores 04/26/2016 11/27/2015 10/17/2015 06/26/2015 11/20/2014  PHQ - 2 Score 0 0 0 0 0    Assessment:   [employer] employee and Link To Wellness member with Type I DM, on 670 G insulin pump. Today's POC Hgb A1C= 6.5%%. Also has slightly elevated LDL of 103 on lipid profile done 01/27/16 .   Plan:  [employer] [facility] CM Care Plan Problem One        Most Recent Value   Care Plan Problem One  Type I DM, meeting treatment target Hgb A1C  As evidenced by today's POC Hgb A1C= 6.5%, slightly elevated LDL of 103 per lipid panel done 01/27/16   Role Documenting the Problem  One  Care Management Coordinator   Care Plan for Problem One  Active   [facility] Long Term Goal (31-90 days)  Ongoing good management of type I DM  as evidenced by >75% of self monitored CBGs meeting target and Hgb A1C <7.0% at next assessment with no incidences of severe hypoglycemia   [facility] Long Term Goal Start Date  01/12/2017   Interventions for Problem One Long Term Goal  reviewed pump settings and her satisfaction with the 670 G closed loop system, reviewed CBG variance, discussed frequency and severity of hypoglycemia, assessed POC CBG and Hgb A1C and discussed results, provided Skittles to treat hypoglycemia,  reviewed lipid profile of 01/27/16 and discussed strategies to reduce LDL, will arrange for Link To Wellness follow  up in September      RNCM to fax today's office visit note to  Jacques Earthly NP. RNCM will meet twice yearly with patient to assist with Type I DM self-management and assess patient's progress toward mutually set goals.  Bary Richard RN,CCM,CDE [facility] Care Management Coordinator Link To Wellness Office Phone [phone] Office Fax [phone]

## 2017-03-11 DIAGNOSIS — Z9641 Presence of insulin pump (external) (internal): Secondary | ICD-10-CM | POA: Diagnosis not present

## 2017-03-11 DIAGNOSIS — E109 Type 1 diabetes mellitus without complications: Secondary | ICD-10-CM | POA: Diagnosis not present

## 2017-03-11 DIAGNOSIS — Z794 Long term (current) use of insulin: Secondary | ICD-10-CM | POA: Diagnosis not present

## 2017-04-14 MED FILL — HumaLOG 100 UNIT/ML SOLN: 100 | 90 days supply | Qty: 90 | Fill #3

## 2017-04-14 MED FILL — CONTOUR NEXT STRIPS: 58 days supply | Qty: 350 | Fill #3

## 2017-05-13 IMAGING — CT CT HEAD W/O CM
2 of 3 series · 17 of 30 positions shown, 20 images · non-contrast
Comparison: MRI brain dated 01/08/2012

CLINICAL DATA: Fall, occipital head trauma, 8 months pregnant

EXAM:
CT HEAD WITHOUT CONTRAST
TECHNIQUE: Contiguous axial images were obtained from the base of the skull
through the vertex without intravenous contrast.

[Series 3: head w/o thins · axial · non-contrast · 0.40mm/px · z∈[-22,+105]mm · 11 of 128 slices shown, 14 images]
[im 11/128  brain]
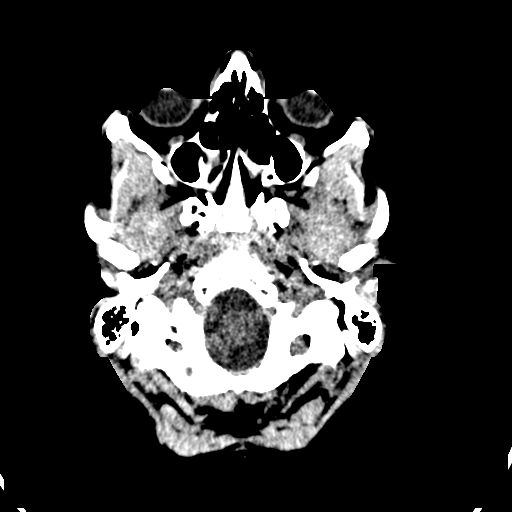
[im 11/128  bone]
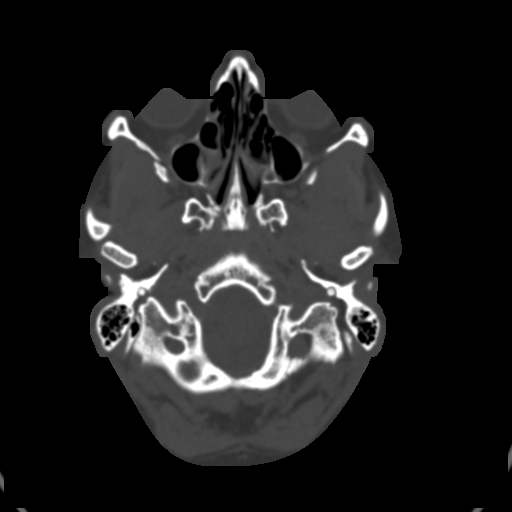
[im 22/128  brain]
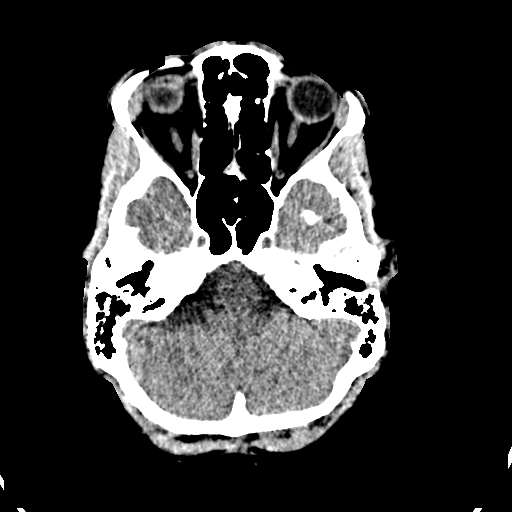
[im 32/128  brain]
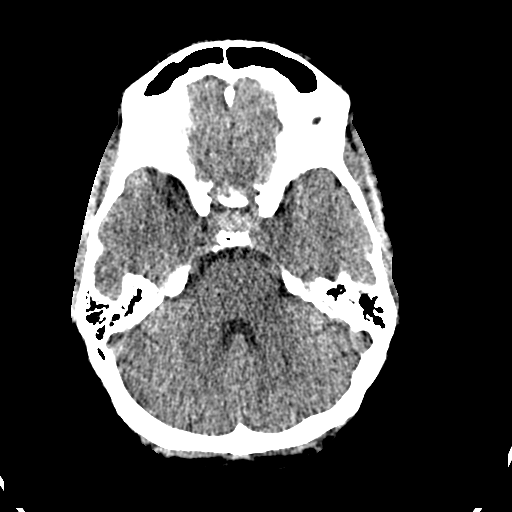
[im 43/128  brain]
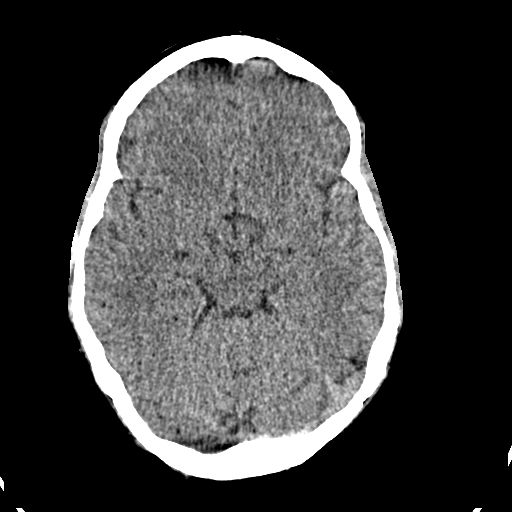
[im 53/128  brain]
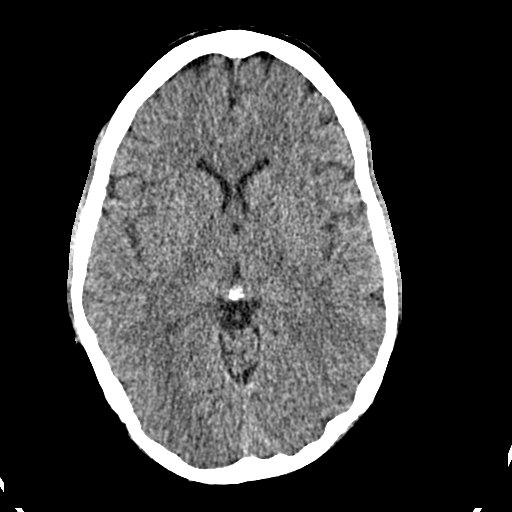
[im 53/128  bone]
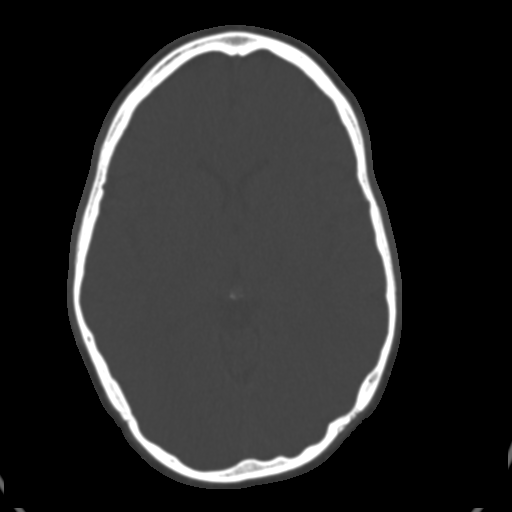
[im 64/128  brain]
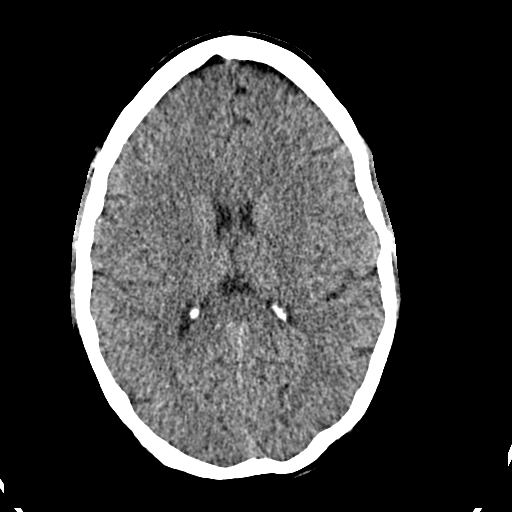
[im 75/128  brain]
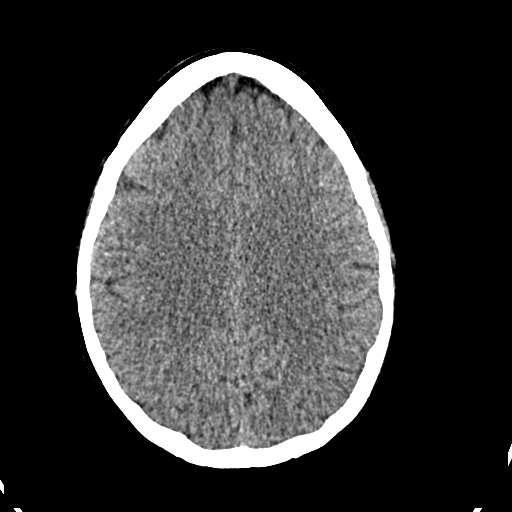
[im 85/128  brain]
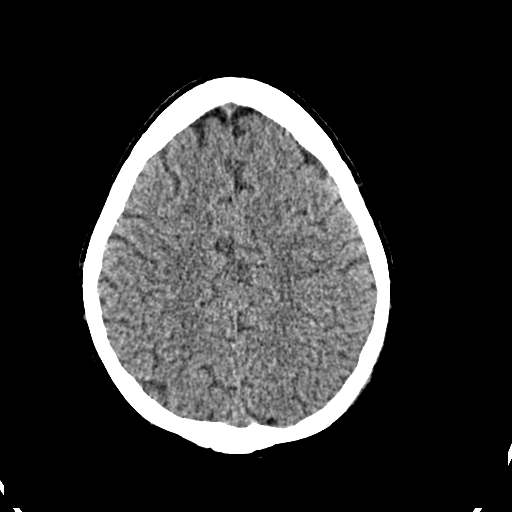
[im 96/128  brain]
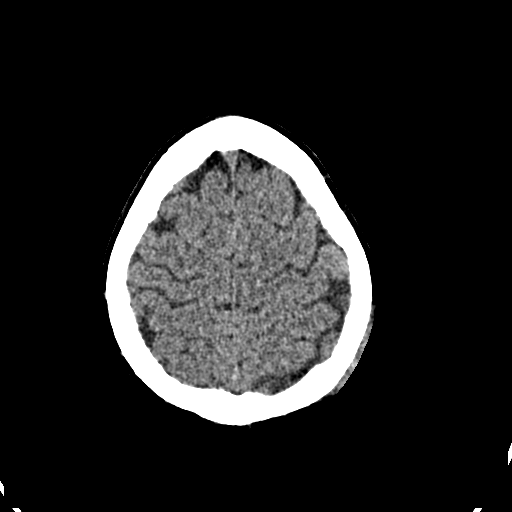
[im 96/128  bone]
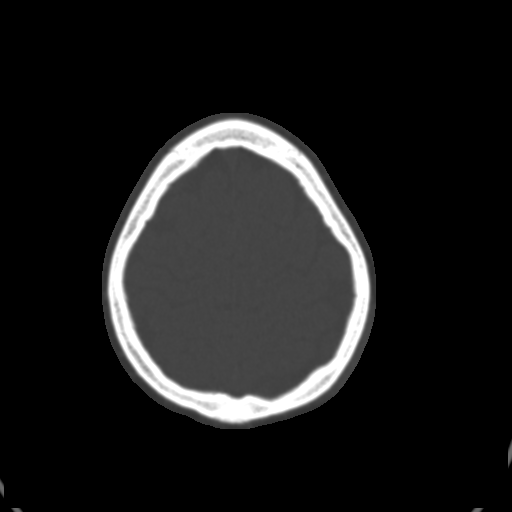
[im 106/128  brain]
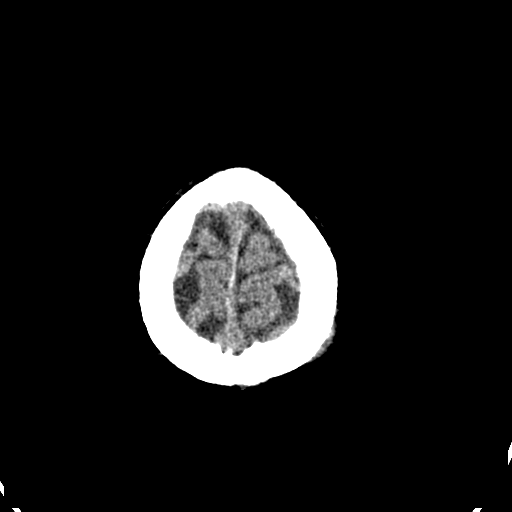
[im 117/128  brain]
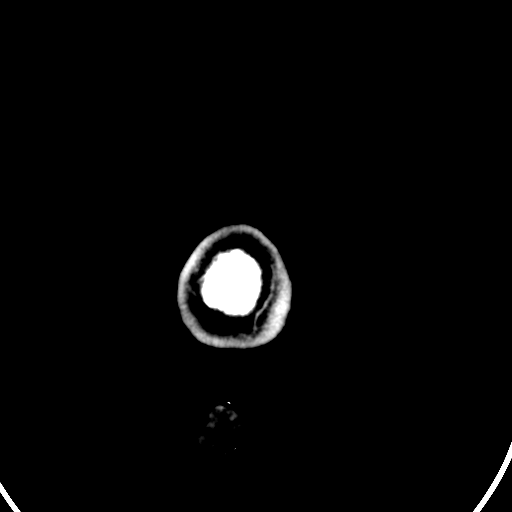

[Series 4: routine head bone · axial · 0.40mm/px · z∈[-22,+67]mm · 6 of 128 slices shown]
[im 11/128  bone]
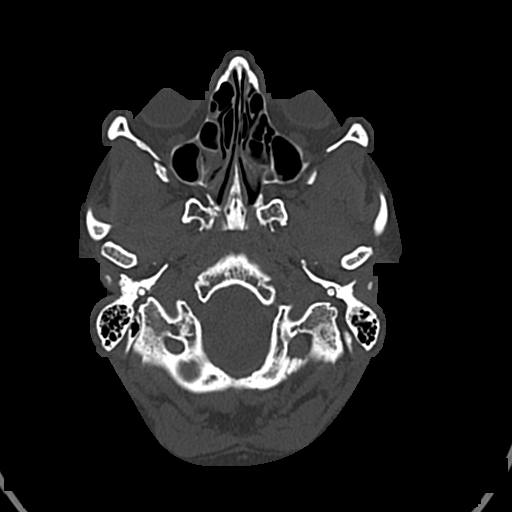
[im 32/128  bone]
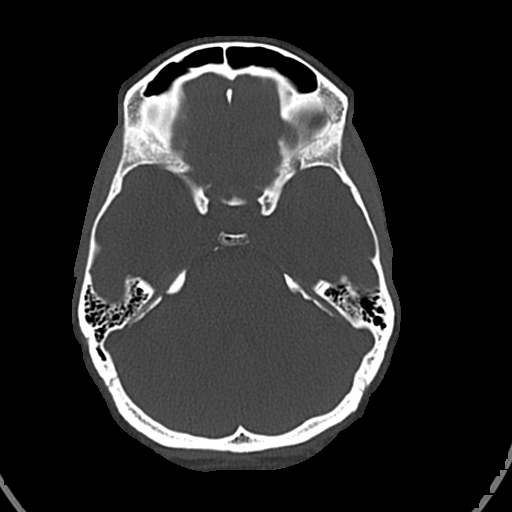
[im 43/128  bone]
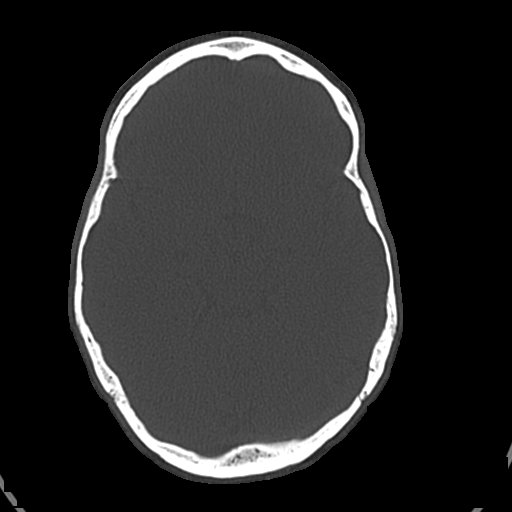
[im 53/128  bone]
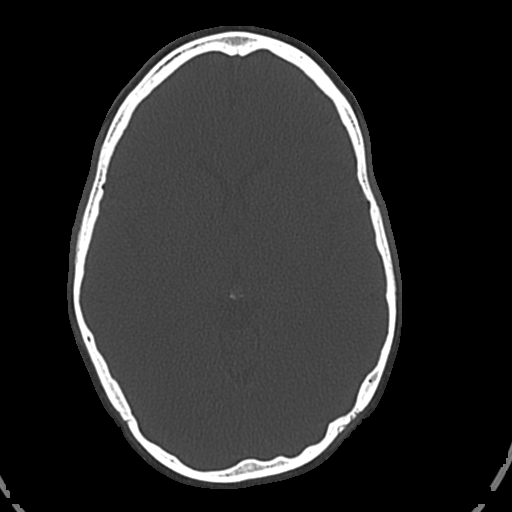
[im 75/128  bone]
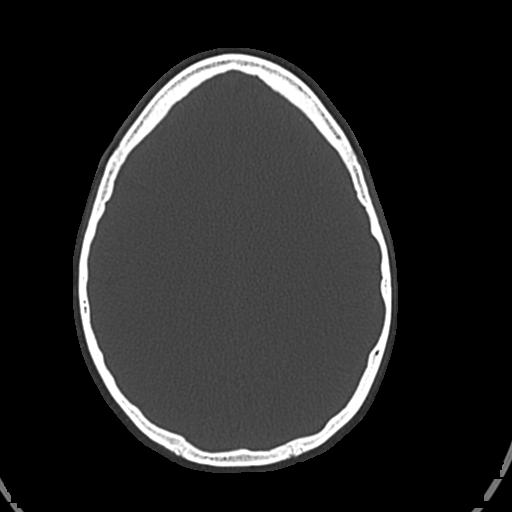
[im 85/128  bone]
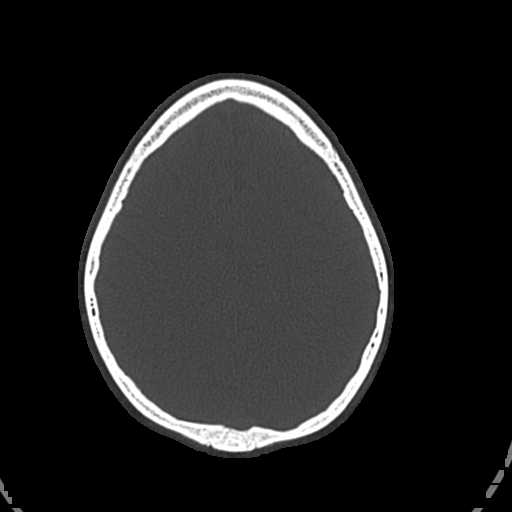

[17 of 30 positions shown; findings below may reference images not displayed]

FINDINGS: Brain: No evidence of acute infarction, hemorrhage, hydrocephalus,
extra-axial collection or mass lesion/mass effect.

Vascular: No hyperdense vessel or unexpected calcification.

Skull: Normal. Negative for fracture or focal lesion.

Sinuses/Orbits: No acute finding.

Other: Cerebral volume is within normal limits. No ventriculomegaly.
IMPRESSION: Normal head CT.

## 2017-06-10 ENCOUNTER — Other Ambulatory Visit: Payer: Self-pay | Admitting: *Deleted

## 2017-06-10 NOTE — Patient Outreach (Signed)
Secure e-mail sent to [facility]mail [facility] requesting she schedule a routine DM Link To Wellness follow up appointment for sometime in September. Await response from [name]. Lindsey RichardJanet S. Hauser RN,CCM,CDE [facility] Care Management Coordinator Link To Wellness and [facility] Office Phone [phone] Office Fax [phone]

## 2017-06-14 DIAGNOSIS — E109 Type 1 diabetes mellitus without complications: Secondary | ICD-10-CM | POA: Diagnosis not present

## 2017-06-14 DIAGNOSIS — Z794 Long term (current) use of insulin: Secondary | ICD-10-CM | POA: Diagnosis not present

## 2017-06-14 DIAGNOSIS — Z9641 Presence of insulin pump (external) (internal): Secondary | ICD-10-CM | POA: Diagnosis not present

## 2017-06-20 ENCOUNTER — Ambulatory Visit: Payer: Self-pay | Admitting: *Deleted

## 2017-06-21 ENCOUNTER — Other Ambulatory Visit: Payer: Self-pay | Admitting: *Deleted

## 2017-06-21 VITALS — BP 136/88 | Ht 64.0 in | Wt 180.0 lb

## 2017-06-21 DIAGNOSIS — E109 Type 1 diabetes mellitus without complications: Secondary | ICD-10-CM

## 2017-06-21 LAB — POCT GLYCOSYLATED HEMOGLOBIN (HGB A1C): Hemoglobin A1C: 8.3

## 2017-06-21 LAB — POCT CBG (FASTING - GLUCOSE)-MANUAL ENTRY: Glucose Fasting, POC: 230 mg/dL — AB (ref 70–99)

## 2017-06-21 NOTE — Patient Outreach (Signed)
[facility]) Care Management   06/21/2017  Early CharsJessica P Hailes 1986/08/06 [id number]  Early CharsJessica P [name] is an 31 y.o. female who [facility] Care Management office for routine Link To Wellness follow up for self management assistance with Type I DM.  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 She says she continues to wear the 670 G Medtronic insulin pump and it remains in the manual mode because she is not wearing the sensor due to the frequent alarms. She says she will start a [employer] exercise routine at home and focus on improving her glycemic control. She requested a POC Hgb A1C because she is not sure if she will continue [place] or change to someone in a more convenient location.   Objective:   Brayton CavesJessie gave verbal permission for [employer] [employer] student nurse Hunt OrisPayton Bonner to observe visit.   Review of Systems  Constitutional: Negative.     Physical Exam  Constitutional: She is oriented to person, place, and time. She appears well-developed and well-nourished.  Respiratory: Effort normal.  Neurological: She is alert and oriented to person, place, and time.  Skin: Skin is warm and dry.  Psychiatric: She has a normal mood and affect. Her behavior is normal. Judgment and thought content normal.   Today's Vitals   06/21/17 1215 06/21/17 1257  BP:  136/88  Weight:  180 lb (81.6 kg)  Height:  1.626 m (5\' 4" )  PainSc: 0-No pain     POC Hgb A1C= 8.3% POC post meal CBG= 230    Encounter Medications:   Outpatient Encounter Prescriptions as of 06/21/2017  Medication Sig Note  . cetirizine (ZYRTEC) 10 MG tablet Take 10 mg by mouth daily.   . insulin lispro (HUMALOG) 100 UNIT/ML injection Inject into the skin. Use up  to 100 units per day as directed in insulin pump 10/27/2016: Bolus: From 12 -7 am 1 unit per 6g of carbs, from 7am -11am 1 unit per 5.5g of carbs, from 11am to 5pm 1 unit per 6g of carbs, 5-9pm 1 unit per 5.5g of carbs, and 9pm -12am 1 unit per 10g of carbs.    No facility-administered encounter medications on file as of 06/21/2017.     Functional Status:   In your present state of health, do you have any difficulty performing the following activities: 01/12/2017 10/29/2016  Hearing? N N  Vision? N N  Difficulty concentrating or making decisions? N N  Walking or climbing stairs? N N  Dressing or bathing? N N  Doing errands, shopping? N -  Some recent data might be hidden    Fall/Depression Screening:    PHQ 2/9 Scores 04/26/2016 11/27/2015 10/17/2015 06/26/2015 11/20/2014  PHQ - 2 Score 0 0 0 0 0    Assessment:   [employer] employee and Link To Wellness member with Type I DM, on 670 G insulin pump. Today's POC Hgb A1C= 8.3. A significant increase from the previous 6.5%. Also has slightly elevated LDL of 103 on lipid profile done 01/27/16 .   Plan:  [facility] CM Care Plan Problem One        Most Recent Value   Care Plan Problem One  Type I DM, no longer meeting treatment target Hgb A1C  As evidenced  by today's POC Hgb A1C= 8.3%, slightly elevated LDL of 103 per lipid panel done 01/27/16   Role Documenting the Problem One  Care Management Coordinator   Care Plan for Problem One  Active   [facility] Long Term Goal (31-90 days)  Improved self  management of type I DM  as evidenced by >75% of self monitored CBGs meeting target and Hgb A1C <7.0% at next assessment with no incidences of severe hypoglycemia   [facility] Long Term Goal Start Date  06/21/2017   Interventions for Problem One Long Term Goal  Discussed barriers to self management, since she does not wear the Guardian sensor with her insulin pump and she is having difficulty checking her blood sugar on a regular basis demonstrated the [employer] flash glucose  monitoring system and provided [name] with an Marine scientist and discussed her out of pocket costs for the system, assessed POC CBG and Hgb A1C and discussed results, discussed pros and cons of changing to a local endocrinologist , will arrange for Link To Wellness follow up in December      RNCM to fax today's office visit note to  Jacques Earthly NP. RNCM will meet twice quarterly with patient to assist with Type I DM self-management and assess patient's progress toward mutually set goals.  Bary Richard RN,CCM,CDE [facility] Care Management Coordinator Link To Wellness Office Phone [phone] Office Fax [phone]

## 2017-08-10 DIAGNOSIS — L680 Hirsutism: Secondary | ICD-10-CM | POA: Diagnosis not present

## 2017-08-10 DIAGNOSIS — E559 Vitamin D deficiency, unspecified: Secondary | ICD-10-CM | POA: Diagnosis not present

## 2017-08-10 DIAGNOSIS — Z13220 Encounter for screening for lipoid disorders: Secondary | ICD-10-CM | POA: Diagnosis not present

## 2017-08-10 DIAGNOSIS — L709 Acne, unspecified: Secondary | ICD-10-CM | POA: Diagnosis not present

## 2017-08-10 DIAGNOSIS — Z0389 Encounter for observation for other suspected diseases and conditions ruled out: Secondary | ICD-10-CM | POA: Diagnosis not present

## 2017-08-10 DIAGNOSIS — Z0000 Encounter for general adult medical examination without abnormal findings: Secondary | ICD-10-CM | POA: Diagnosis not present

## 2017-08-10 DIAGNOSIS — E109 Type 1 diabetes mellitus without complications: Secondary | ICD-10-CM | POA: Diagnosis not present

## 2017-08-10 DIAGNOSIS — R5383 Other fatigue: Secondary | ICD-10-CM | POA: Diagnosis not present

## 2017-08-18 ENCOUNTER — Other Ambulatory Visit: Payer: Self-pay | Admitting: *Deleted

## 2017-08-18 DIAGNOSIS — Z0389 Encounter for observation for other suspected diseases and conditions ruled out: Secondary | ICD-10-CM | POA: Diagnosis not present

## 2017-08-22 NOTE — Patient Outreach (Signed)
Spoke to [place] by phone to advise her that diabetes self management assistance will be transitioned from the Link To Wellness program to Active Health Management in 2019. Ensured that she has completed the health risk assessment on the [email or web address] website. Also advised Brayton CavesJessie that a letter will be mailed to the home residence with details of this transition. Brayton CavesJessie expressed appreciation to this RNCM for the help provided to her in the Link To Wellness program.  Encouraged her to contact this RNCM for further questions for concerns.                                                         Will close case to Link To Wellness diabetes program. Bary RichardJanet S. Hauser RN,CCM,CDE [facility] Care Management Coordinator Link To Wellness and [facility] Office Phone [phone] Office Fax [phone]

## 2017-08-25 DIAGNOSIS — E039 Hypothyroidism, unspecified: Secondary | ICD-10-CM | POA: Diagnosis not present

## 2017-08-25 DIAGNOSIS — E669 Obesity, unspecified: Secondary | ICD-10-CM | POA: Diagnosis not present

## 2017-08-25 DIAGNOSIS — E109 Type 1 diabetes mellitus without complications: Secondary | ICD-10-CM | POA: Diagnosis not present

## 2017-08-25 DIAGNOSIS — E559 Vitamin D deficiency, unspecified: Secondary | ICD-10-CM | POA: Diagnosis not present

## 2017-08-25 MED FILL — LEVOTHYROXINE 25 MCG TABLET: 25 | 30 days supply | Qty: 30 | Fill #0

## 2017-09-12 DIAGNOSIS — Z9641 Presence of insulin pump (external) (internal): Secondary | ICD-10-CM | POA: Diagnosis not present

## 2017-09-12 DIAGNOSIS — Z794 Long term (current) use of insulin: Secondary | ICD-10-CM | POA: Diagnosis not present

## 2017-09-12 DIAGNOSIS — E109 Type 1 diabetes mellitus without complications: Secondary | ICD-10-CM | POA: Diagnosis not present

## 2017-10-06 DIAGNOSIS — E109 Type 1 diabetes mellitus without complications: Secondary | ICD-10-CM | POA: Diagnosis not present

## 2017-10-06 DIAGNOSIS — Z9641 Presence of insulin pump (external) (internal): Secondary | ICD-10-CM | POA: Diagnosis not present

## 2017-10-10 MED FILL — CONTOUR NEXT STRIPS: 85 days supply | Qty: 600 | Fill #0

## 2017-10-10 MED FILL — HumaLOG 100 UNIT/ML SOLN: 100 | 90 days supply | Qty: 30 | Fill #0

## 2017-10-17 MED FILL — LEVOTHYROXINE 25 MCG TABLET: 25 | 30 days supply | Qty: 30 | Fill #1

## 2017-10-21 ENCOUNTER — Ambulatory Visit: Payer: 59 | Admitting: Internal Medicine

## 2017-10-21 ENCOUNTER — Encounter: Payer: Self-pay | Admitting: Internal Medicine

## 2017-10-21 VITALS — BP 120/78 | HR 93 | Ht 65.0 in | Wt 183.4 lb

## 2017-10-21 DIAGNOSIS — L709 Acne, unspecified: Secondary | ICD-10-CM | POA: Diagnosis not present

## 2017-10-21 DIAGNOSIS — E10649 Type 1 diabetes mellitus with hypoglycemia without coma: Secondary | ICD-10-CM

## 2017-10-21 LAB — POCT GLYCOSYLATED HEMOGLOBIN (HGB A1C): Hemoglobin A1C: 7.1

## 2017-10-21 MED ORDER — GLUCAGON (RDNA) 1 MG IJ KIT: 1.0000 mg | PACK | Freq: Once | INTRAMUSCULAR | 12 refills | Status: DC | PRN

## 2017-10-21 MED ORDER — FREESTYLE LIBRE 14 DAY READER DEVI: 1.0000 | Freq: Once | 1 refills | Status: AC

## 2017-10-21 MED ORDER — FREESTYLE LIBRE 14 DAY SENSOR MISC: 1.0000 | 11 refills | Status: DC

## 2017-10-21 MED FILL — GLUCAGON 1 MG EMERGENCY KIT: 1 | 1 days supply | Qty: 1 | Fill #0

## 2017-10-21 NOTE — Progress Notes (Signed)
Patient ID: Lindsey [name], female   DOB: [birth year], 32 y.o.   MRN: [id number]   HPI: Lindsey [name] is a 32 y.o.-year-old female, referred by her PCP, Dr. Anastasio Champion, for management of DM1, uncontrolled, without complications.  Patient has been diagnosed with diabetes at 31 mo old; she started on insulin pump at 32 y/o.  Last hemoglobin A1c was: 08/10/2017: HbA1c 7.5% Lab Results  Component Value Date   HGBA1C 8.3 06/21/2017   HGBA1C 6.5 01/12/2017   HGBA1C 5.9 (H) 10/28/2016   Pt is on an insulin pump: 670 G, initially with CGM - now off (04/2016), uses Humalog in the pump.  She is only in manual mode now.  She had many problems with the Medtronic CGM's she would not want to restart this.  Pump settings: - basal rates: 12 am: 1.05 units/h 2 AM: 1.25 9 AM: 1.05 2 PM: 1.10 6 PM: 1.20 9 PM: 1.05 - ICR: 1:12 - target: 100-100 - ISF: 40 - Insulin on Board: 2 hours (!) - bolus wizard: on, but she is doing more manual boluses (2.9/day) compared to wizard boluses (1.6/day)! TDD from basal insulin: 28 units TDD from bolus insulin: 28 units  total daily dose: Up to 70 units per day - extended bolusing: not using - changes infusion site: q5 days (!) - Meter:Bayer Contour Next Link 2.4  She checks her sugars 1.3 times a day (!) Average blood sugars 163+/-83 - am: 86-172, 336 - 2h after b'fast: 274 - before lunch: 193-241 - 2h after lunch: n/c - before dinner: 43, 73, 114, 124, 274 - 2h after dinner: 43, 71, 170, 192 - bedtime: n/c - nighttime: n/c +lows, not at night. Lowest sugar was 40s; she has hypoglycemia awareness at 60. No previous hypoglycemia admission. Does not (!) have a glucagon kit at home. Highest sugar was 300s. No previous DKA admissions.    Pt's meals are: - Breakfast: 2 eggs, protein bar - if not skipping - Lunch: meat, veggies, dessert, sometimes fruit - Dinner: meat, veggies - Snacks: 2-3: cheese, crackers, fruit  - no CKD, last BUN/creatinine:  Lab  Results  Component Value Date   BUN 9 10/28/2016   BUN 5 (L) 01/08/2012   CREATININE 0.74 10/28/2016   CREATININE 0.80 01/08/2012   - last set of lipids: No results found for: CHOL, HDL, LDLCALC, LDLDIRECT, TRIG, CHOLHDL - last eye exam was in 2017. No DR.  - no numbness and tingling in her feet.  Last TSH: 08/11/2017: TSH 6.12 No results found for: TSH She was started on LT4 25 mcg daily.  She is due for another visit with Dr. Anastasio Champion when she will have this checked.  PCP was also worried she may have PCOS:  - acne - worse after 32 y/o - hirsutism - + upper lip, chest - regular menses Not on OCPs.  ROS: Constitutional: no weight gain/loss,  fatigue, no subjective hyperthermia/hypothermia Eyes: no blurry vision, no xerophthalmia ENT: no sore throat, no nodules palpated in throat, no dysphagia/odynophagia, no hoarseness Cardiovascular: no CP/SOB/palpitations/leg swelling Respiratory: no cough/SOB Gastrointestinal: no N/V/D/C Musculoskeletal: no muscle/joint aches Skin: no rashes Neurological: no tremors/numbness/tingling/dizziness Psychiatric: no depression/anxiety + Low libido  Past Medical History:  Diagnosis Date  . Diabetes mellitus   . Diabetes mellitus affecting pregnancy in first trimester 03/16/2016  . Pregnant 03/16/2016  . Supervision of other high-risk pregnancy 03/31/2016    [facility] Initiated Care at   8+5 weeks FOB  Marlou Porch 32 yo wm  second child Dating By  LMP and [place] Pap  03/31/16 GC/CT Initial:                36+wks: Genetic Screen NT/IT:  CF screen  Anatomic [place]  Flu vaccine  Tdap Recommended ~ 28wks Glucose Screen  2 hr GBS  Feed Preference  Contraception  Circumcision  Childbirth Classes  Pediatrician     Past Surgical History:  Procedure Laterality Date  . CESAREAN SECTION N/A 10/29/2016   Procedure: PRIMARY CESAREAN SECTION;  Surgeon: Florian Buff, MD;  Location: [facility];  Service: Obstetrics;  Laterality: N/A;  . NO PAST SURGERIES      Social History   Socioeconomic History  . Marital status: Married    Spouse name: Not on file  . Number of children: 2  Social Needs  Occupational History  .  Marketing, Designer, fashion/clothing, community outreach  Tobacco Use  . Smoking status: Never Smoker  . Smokeless tobacco: Never Used  Substance and Sexual Activity  . Alcohol use: Yes    Alcohol/week: 1.2 oz    Types: 1 Glasses of wine, 1 Shots of liquor per week    Comment: occ  . Drug use: No   Current Outpatient Medications on File Prior to Visit  Medication Sig Dispense Refill  . cetirizine (ZYRTEC) 10 MG tablet Take 10 mg by mouth daily.    . insulin lispro (HUMALOG) 100 UNIT/ML injection Inject into the skin. Use up to 100 units per day as directed in insulin pump     No current facility-administered medications on file prior to visit.    No Known Allergies Family History  Problem Relation Age of Onset  . Hypertension Father   . Cancer Maternal Grandmother   . Cancer Maternal Grandfather   . Alzheimer's disease Paternal Grandfather     PE: BP 120/78   Pulse 93   Ht 5' 5"  (1.651 m)   Wt 183 lb 6.4 oz (83.2 kg)   LMP 10/10/2017   SpO2 98%   Breastfeeding? No   BMI 30.52 kg/m  Wt Readings from Last 3 Encounters:  10/21/17 183 lb 6.4 oz (83.2 kg)  06/21/17 180 lb (81.6 kg)  01/12/17 175 lb (79.4 kg)   Constitutional: overweight, in NAD Eyes: PERRLA, EOMI, no exophthalmos ENT: moist mucous membranes, no thyromegaly, no cervical lymphadenopathy Cardiovascular: Tachycardia RR, No MRG Respiratory: CTA B Gastrointestinal: abdomen soft, NT, ND, BS+ Musculoskeletal: no deformities, strength intact in all 4 Skin: moist, warm, + acne spots on face Neurological: no tremor with outstretched hands, DTR normal in all 4  ASSESSMENT: 1. DM1, uncontrolled, without complications  2. Acne  PLAN:  1. Patient with long-standing, uncontrolled DM1, on insulin pump therapy.  She has had this for a long time, but is  controlled during her pregnancies.  In the last few months, her control started to improve after she started to make changes in her diet. HbA1c today was 7.1%, improved.   - At this visit, we downloaded her pump and reviewed together the reports. Problems identified:  She is changing Her infusion sites every 5 days - I strongly advised her to change it every 3-4 days  She is not checking sugars frequently enough - she mentions that even when she checks, it is cumbersome to transfer the CBG values from the meter to her pump as it takes several clicks.  We discussed about the possibility of starting a freestyle libre CGM and she would like to try this.  I sent a prescription to her pharmacy  She is not relying enough on the bolus wizard, doing twice more manual boluses -I advised her to try to use the bolus wizard more so we can adjust her pump settings better at next visit  She is not entering enough meals in the pump, averaging one a day  - she is aware that she needs to do a better job entering the meals  She is not entering enough carbs in the pump for the above reason, averaging 78 g/day.  She is having a high fat breakfast and she does not introduce carbs in the pump for this but we discussed about introducing 50% of proteins as carbs if the number of carb in the meal is low.  If she is having a protein bar, we discussed about also using 50% of the protein and at the number the total number of carbs -she will try that. If her sugars still increase after meals >> I advised her to try to reduce her insulin to carb ratio. She feels comfortable with carb counting  She has a short insulin on board time, of 2 hours, which we discussed is conducive to hypoglycemia.  We will increase this to 3 hours.  We also discussed about the importance of bolusing 10-15 minutes before every meal, rather than during the meal  She does not have a glucagon kit at home and we discussed that it is important that she  has 1 in the family members know how to use it.  I sent a prescription to her pharmacy. - We discussed about changes to his insulin regimen, as follows:  Patient Instructions  Please change the pump settings as follows: - basal rates: 12 am: 1.05 units/h 2 AM: 1.25 9 AM: 1.05 2 PM: 1.10 6 PM: 1.20 9 PM: 1.05 - ICR: 1:12 (may need to decrease to 1:10 if sugars after meals are consistently >30 above starting point) - target: 100-100 - ISF: 40 - Insulin on Board: 2 hours >> 3 hours  Please return in 3 months with your sugar log.   - Strongly advised her to start checking sugars at different times of the day - check at least 4 times a day, rotating checks - given sugar log and advised how to fill it and to bring it at next appt  - given foot care handout and explained the principles  - given instructions for hypoglycemia management "15-15 rule"  - advised for yearly eye exams - advised to get ketone strips - advised to always have Glu tablets with her - advised for a Med-alert bracelet mentioning "type 1 diabetes mellitus". - no signs of other autoimmune disorders - Return to clinic in 3 mo with sugar log   2.  Acne - Patient noticed more acne starting 2 years ago, and exacerbated around the time of her menstrual cycles - She has some unwanted hair, but not bothersome - Her menstrual cycles are regular  - She is determined to work on her diet and decrease her insulin resistance, which I feel will help with her acne  - we also discussed about possible investigation by dermatology. - I do not feel we need to pursue investigation for PCOS at the moment, especially since she has regular menstrual cycles and treatment for this condition starts with improving insulin resistance, by losing weight and improving diet.  We did discuss that this is a some of several conditions including metabolic disturbances, irregular menstrual cycles, fertility problems,  and the skin problems (acne and  hirsutism) which is treated based on which of the above problems bothers the patient the most.  We did discuss about possibly adding metformin which would help both her diabetes and possibly her hirsutism, but she would like to hold off on this for now.  We also discussed about the possibility of Spironolactone, but she would like to hold off for now also. - I will reevaluate her acne  when she returns and we may need to check a testosterone level if the acne does not improve after she loses weight - Patient agrees with the plan  - time spent with the patient: 1h, of which >50% was spent in obtaining information about herdisease, reviewing previous labs, office visit notes, and DM treatments, counseling pt about her condition (please see the discussed topics above), and developing a plan to prevent further hypoglycemia and hyperglycemia. We also discussed about proper diet. Pt had a number of questions which I addressed.  Philemon Kingdom, MD PhD [facility] Endocrinology

## 2017-10-21 NOTE — Patient Instructions (Addendum)
Please change the pump settings as follows: - basal rates: 12 am: 1.05 units/h 2 AM: 1.25 9 AM: 1.05 2 PM: 1.10 6 PM: 1.20 9 PM: 1.05 - ICR: 1:12 (may need to decrease to 1:10 if sugars after meals are consistently >30 above starting point) - target: 100-100 - ISF: 40 - Insulin on Board: 2 hours >> 3 hours  Please return in 3 months with your sugar log.   Basic Rules for Patients with Type I Diabetes Mellitus  1. The American Diabetes Association (ADA) recommended targets: - fasting sugar <130 - after meal sugar <180 - HbA1C <7%  2. Engage in ?150 min moderate exercise per week  3. Make sure you have ?8h of sleep every night as this helps both blood sugars and your weight.  4. Always keep a sugar log (not only record in your meter) and bring it to all appointments with us.  5. If you are on a pump, know how to access the settings and to modify the parameters.  6.  Remember, you can always call the number on the back of the pump for emergencies related to the pump.  7. "15-15 rule" for hypoglycemia: if sugars are low, take 15 g of carbs** ("fast sugar" - e.g. 4 glucose tablets, 4 oz orange juice), wait 15 min, then check sugars again. If still <80, repeat. Continue  until your sugars >80, then eat a normal meal.   8. Teach family members and coworkers to inject glucagon. Have a glucagon set at home and one at work. They should call 911 after using the set.  9. If you are on a pump, set "insulin on board" time for 5 hours (if your sugars tend to be higher, can use 4 hours).   10. If you are on a pump, use the "dual wave bolus" setting for high fat foods (e.g. pizza). Start with a setting of 50%-50% (50% instant bolus and 50% prolonged bolus over 3h, for e.g.).    11. If you are on a pump, make sure the basal daily insulin dose is approximately equal (not larger) to the daily insulin you get from boluses, otherwise you are at risk for hypoglycemia.  12. Check sugar before  driving. If <100, correct, and only start driving if sugars rise ?401100. Check sugar every hour when on a long drive.  13. Check sugar before exercising. If <100, correct, and only start exercising if sugars rise ?100. Check sugar every hour when on a long exercise routine and 1h after you finished exercising.   If >250, check urine for ketones. If you have moderate-large ketones in urine, do not start exercise. Hydrate yourself with clear liquids and correct the high sugar. Recheck sugars and ketones before attempting to exercise.  Be aware that you might need less insulin when exercising.  *intense, short, exercise bursts can increase your sugars, but  *less intense, longer (>1h), exercise routines can decrease your sugars.  If you are on a pump, you might need to decrease your basal rate by 10% or more (or even disconnect your pump) while you exercise to prevent low sugars. Do not disconnect your pump by more than 3 hours at a time! You also might need to decrease your insulin bolus for the meal prior to your exercise time by 20% or more.  14. Make sure you have a MedAlert bracelet or pendant mentioning "Type I Diabetes Mellitus". If you have a prior episode of severe hypoglycemia or hypoglycemia unawareness, it should  also mention this.  15. Please do not walk barefoot. Inspect your feet for sores/cuts and let us know if you have them.  16. Please call  Endocrinology with any questions and concerns [phone]).   **E.g. of "fast carbs": ? first choice (15 g):  1 tube glucose gel, GlucoPouch 15, 2 oz glucose liquid ? second choice (15-16 g):  3 or 4 glucose tablets (best taken  with water), 15 Dextrose Bits chewable ? third choice (15-20 g):   cup fruit juice,  cup regular soda, 1 cup skim milk,  1 cup sports drink ? fourth choice (15-20 g):  1 small tube [place] gel (not frosting), 2 tbsp raisins, 1 tbsp table sugar,  candy, jelly beans, gum drops - check package for  carb amount   (adapted from: Juluis Rainier. "Insulin therapy and hypoglycemia" Endocrinol Metab Clin N Am 2012, 41: 57-87)

## 2017-11-02 ENCOUNTER — Telehealth: Payer: Self-pay | Admitting: Internal Medicine

## 2017-11-02 NOTE — Telephone Encounter (Signed)
Med impacted call they are processing the prior auth for a script and they have some questions about it    Please advise  [phone] Ref- Prior authorization 39

## 2017-11-03 ENCOUNTER — Encounter: Payer: Self-pay | Admitting: Internal Medicine

## 2017-11-07 NOTE — Telephone Encounter (Signed)
This has been handled already and PA was approved

## 2017-11-10 MED FILL — FREESTYLE LIBRE 14 DAY SENS: 28 days supply | Qty: 2 | Fill #0

## 2017-11-10 MED FILL — FREESTYLE LIBRE 14 DAY READ: 14 days supply | Qty: 1 | Fill #0

## 2017-11-17 MED FILL — LEVOTHYROXINE 25 MCG TABLET: 25 | 30 days supply | Qty: 30 | Fill #2

## 2017-11-18 ENCOUNTER — Encounter: Payer: Self-pay | Admitting: Internal Medicine

## 2017-11-18 MED ORDER — INSULIN LISPRO 100 UNIT/ML ~~LOC~~ SOLN: SUBCUTANEOUS | 0 refills | Status: DC

## 2017-11-29 ENCOUNTER — Other Ambulatory Visit: Payer: Self-pay | Admitting: Internal Medicine

## 2017-11-29 MED FILL — HumaLOG 100 UNIT/ML SOLN: 100 | 42 days supply | Qty: 30 | Fill #0

## 2018-01-03 ENCOUNTER — Telehealth: Payer: Self-pay | Admitting: Internal Medicine

## 2018-01-03 ENCOUNTER — Other Ambulatory Visit: Payer: Self-pay

## 2018-01-03 MED ORDER — INSULIN LISPRO 100 UNIT/ML ~~LOC~~ SOLN: SUBCUTANEOUS | 2 refills | Status: DC

## 2018-01-03 MED FILL — HumaLOG 100 UNIT/ML SOLN: 100 | 86 days supply | Qty: 60 | Fill #0

## 2018-01-03 NOTE — Telephone Encounter (Signed)
insulin lispro (HUMALOG) 100 UNIT/ML injection  Patient is needing a new prescription sent in for her insulin   She is needing a 3 month supply with refills.   Patient is completely out of insulin    [facility] [place] - [address]

## 2018-01-03 NOTE — Telephone Encounter (Signed)
This has been done.

## 2018-01-19 ENCOUNTER — Ambulatory Visit: Payer: No Typology Code available for payment source | Admitting: Internal Medicine

## 2018-06-01 MED FILL — HumaLOG 100 UNIT/ML SOLN: 100 | 86 days supply | Qty: 60 | Fill #1

## 2018-06-26 ENCOUNTER — Telehealth: Payer: Self-pay | Admitting: Internal Medicine

## 2018-06-26 NOTE — Telephone Encounter (Signed)
Faxed last OV to [facility], fax confirmed only one visit to send.

## 2018-06-26 NOTE — Telephone Encounter (Signed)
[facility] ph# [phone] needs office visit notes from patient's last 2 visits. Fax to # [phone]

## 2018-07-26 ENCOUNTER — Telehealth: Payer: Self-pay | Admitting: Internal Medicine

## 2018-07-26 NOTE — Telephone Encounter (Signed)
Last visit was 10-21-17.  Visit notes printed and faxed.

## 2018-07-26 NOTE — Telephone Encounter (Signed)
[facility] stated they sent over a request for the most recent progress notes. They would like this faxed to them as soon as we can   FAX- [phone]

## 2018-08-08 MED FILL — SHIPPING COST: 1 days supply | Qty: 1 | Fill #0

## 2018-08-08 MED FILL — HumaLOG 100 UNIT/ML SOLN: 100 | 86 days supply | Qty: 60 | Fill #0

## 2018-08-09 MED FILL — SHIPPING COST: 1 days supply | Qty: 1 | Fill #1

## 2018-08-09 MED FILL — NP THYROID 90 MG TABLET: 90 | 90 days supply | Qty: 90 | Fill #0

## 2018-11-09 ENCOUNTER — Other Ambulatory Visit: Payer: Self-pay

## 2018-11-09 MED ORDER — GLUCOSE BLOOD VI STRP: ORAL_STRIP | 4 refills | Status: DC

## 2018-11-10 MED FILL — HumaLOG 100 UNIT/ML SOLN: 100 | 85 days supply | Qty: 60 | Fill #0

## 2018-11-10 MED FILL — NP THYROID 90 MG TABLET: 90 | 30 days supply | Qty: 30 | Fill #1

## 2018-11-10 MED FILL — SHIPPING COST: 1 days supply | Qty: 1 | Fill #2

## 2018-11-24 MED FILL — SHIPPING COST: 1 days supply | Qty: 1 | Fill #3

## 2018-11-24 MED FILL — NP THYROID 120 MG TABLET: 120 | 30 days supply | Qty: 30 | Fill #0

## 2018-12-26 ENCOUNTER — Other Ambulatory Visit: Payer: Self-pay | Admitting: Internal Medicine

## 2018-12-26 MED FILL — NP THYROID 120 MG TABLET: 120 | 90 days supply | Qty: 90 | Fill #1

## 2018-12-26 NOTE — Telephone Encounter (Signed)
Last OV 10/21/17 refill or refuse please advise

## 2018-12-26 NOTE — Telephone Encounter (Signed)
OK if she returns to see me, OTW per PCP

## 2018-12-27 ENCOUNTER — Other Ambulatory Visit: Payer: Self-pay

## 2018-12-27 MED ORDER — INSULIN LISPRO 100 UNIT/ML ~~LOC~~ SOLN: SUBCUTANEOUS | 2 refills | Status: DC

## 2019-01-10 ENCOUNTER — Ambulatory Visit (INDEPENDENT_AMBULATORY_CARE_PROVIDER_SITE_OTHER): Payer: No Typology Code available for payment source

## 2019-01-22 ENCOUNTER — Ambulatory Visit (INDEPENDENT_AMBULATORY_CARE_PROVIDER_SITE_OTHER): Payer: No Typology Code available for payment source

## 2019-01-29 ENCOUNTER — Ambulatory Visit (INDEPENDENT_AMBULATORY_CARE_PROVIDER_SITE_OTHER): Payer: No Typology Code available for payment source

## 2019-01-31 ENCOUNTER — Ambulatory Visit (INDEPENDENT_AMBULATORY_CARE_PROVIDER_SITE_OTHER): Payer: No Typology Code available for payment source

## 2019-02-05 ENCOUNTER — Ambulatory Visit (INDEPENDENT_AMBULATORY_CARE_PROVIDER_SITE_OTHER): Payer: No Typology Code available for payment source

## 2019-02-15 MED FILL — HumaLOG 100 UNIT/ML SOLN: 100 | 85 days supply | Qty: 60 | Fill #0

## 2019-02-22 ENCOUNTER — Ambulatory Visit (INDEPENDENT_AMBULATORY_CARE_PROVIDER_SITE_OTHER): Payer: No Typology Code available for payment source | Admitting: Internal Medicine

## 2019-03-29 MED FILL — NP THYROID 120 MG TABLET: 120 | 90 days supply | Qty: 90 | Fill #0

## 2019-05-03 MED FILL — HumaLOG 100 UNIT/ML SOLN: 100 | 85 days supply | Qty: 60 | Fill #0

## 2019-05-14 ENCOUNTER — Other Ambulatory Visit: Payer: Self-pay

## 2019-05-14 ENCOUNTER — Other Ambulatory Visit: Payer: No Typology Code available for payment source

## 2019-05-14 DIAGNOSIS — Z20822 Contact with and (suspected) exposure to covid-19: Secondary | ICD-10-CM

## 2019-05-15 LAB — NOVEL CORONAVIRUS, NAA: SARS-CoV-2, NAA: NOT DETECTED

## 2019-05-31 ENCOUNTER — Encounter (INDEPENDENT_AMBULATORY_CARE_PROVIDER_SITE_OTHER): Payer: Self-pay | Admitting: Internal Medicine

## 2019-07-10 ENCOUNTER — Other Ambulatory Visit (INDEPENDENT_AMBULATORY_CARE_PROVIDER_SITE_OTHER): Payer: Self-pay | Admitting: Internal Medicine

## 2019-07-17 ENCOUNTER — Other Ambulatory Visit: Payer: Self-pay

## 2019-07-17 DIAGNOSIS — Z20822 Contact with and (suspected) exposure to covid-19: Secondary | ICD-10-CM

## 2019-07-20 LAB — NOVEL CORONAVIRUS, NAA: SARS-CoV-2, NAA: NOT DETECTED

## 2019-08-06 MED FILL — HumaLOG 100 UNIT/ML SOLN: 100 | 85 days supply | Qty: 60 | Fill #1

## 2019-08-30 ENCOUNTER — Ambulatory Visit (INDEPENDENT_AMBULATORY_CARE_PROVIDER_SITE_OTHER): Payer: No Typology Code available for payment source | Admitting: Internal Medicine

## 2019-08-30 ENCOUNTER — Other Ambulatory Visit: Payer: Self-pay

## 2019-08-30 ENCOUNTER — Encounter (INDEPENDENT_AMBULATORY_CARE_PROVIDER_SITE_OTHER): Payer: Self-pay | Admitting: Internal Medicine

## 2019-08-30 VITALS — BP 140/80 | HR 80 | Ht 64.0 in | Wt 170.8 lb

## 2019-08-30 DIAGNOSIS — E039 Hypothyroidism, unspecified: Secondary | ICD-10-CM

## 2019-08-30 DIAGNOSIS — E109 Type 1 diabetes mellitus without complications: Secondary | ICD-10-CM | POA: Diagnosis not present

## 2019-08-30 DIAGNOSIS — E282 Polycystic ovarian syndrome: Secondary | ICD-10-CM | POA: Diagnosis not present

## 2019-08-30 HISTORY — DX: Hypothyroidism, unspecified: E03.9

## 2019-08-30 NOTE — Progress Notes (Signed)
Metrics: Intervention Frequency ACO  Documented Smoking Status Yearly  Screened one or more times in 24 months  Cessation Counseling or  Active cessation medication Past 24 months  Past 24 months   Guideline developer: UpToDate (See UpToDate for funding source) Date Released: 2014       Wellness Office Visit  Subjective:  Patient ID: Lindsey [name], female    DOB: January 22, 1986  Age: 33 y.o. MRN: [id number]  CC: This delightful lady comes in for follow-up of her type 1 diabetes and hypothyroidism. HPI  Currently she is using 47 units/day of insulin on the insulin pump.  She feels good.  Her diet is reasonable at the present time and she feels comfortable with where she is.  She has an appointment to see an eye doctor in January of next year. She is consistent with NP thyroid for hypothyroidism and her last free T3 level was in a good range at 4.9. She did have one concern today and that was of acne which she is troubled by on a daily basis.  She also describes somewhat excessive hair on her face.  In the past she has had irregular periods but no history of miscarriages. Past Medical History:  Diagnosis Date  . Diabetes mellitus   . Diabetes mellitus affecting pregnancy in first trimester 03/16/2016  . Hypothyroidism, adult 08/30/2019  . Pregnant 03/16/2016  . Supervision of other high-risk pregnancy 03/31/2016    [facility] at   8+5 weeks FOB  Lindsey [name] 33 yo wm second child Dating By  LMP and [place] Pap  03/31/16 GC/CT Initial:                36+wks: Genetic Screen NT/IT:  CF screen  Anatomic [place]  Flu vaccine  Tdap Recommended ~ 28wks Glucose Screen  2 hr GBS  Feed Preference  Contraception  Circumcision  Childbirth Classes  Pediatrician        Family History  Problem Relation Age of Onset  . Hypertension Father   . Cancer Maternal Grandmother   . Cancer Maternal Grandfather   . 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 Smoking status: Never Smoker  . Smokeless tobacco: Never Used  Substance Use Topics  . Alcohol use: Yes    Alcohol/week: 2.0 standard drinks    Types: 1 Glasses of wine, 1 Shots of liquor per week    Comment: occ    Current Meds  Medication Sig  . cetirizine (ZYRTEC) 10 MG tablet Take 10 mg by mouth daily.  . Cholecalciferol (VITAMIN D-3) 125 MCG (5000 UT) TABS Take 3 tablets by mouth daily.  . Continuous Blood Gluc Sensor (FREESTYLE LIBRE 14 DAY SENSOR) MISC 1 each by Does not apply route every 14 (fourteen) days. Change every 2 weeks  . glucagon (GLUCAGON EMERGENCY) 1 MG injection Inject 1 mg into the muscle once as needed for up to 1 dose.  Marland Kitchen glucose blood (CONTOUR NEXT TEST) test strip Use as instructed to check blood sugar 4 times a day.  . insulin lispro (HUMALOG) 100 UNIT/ML injection USE UP TO 70 UNITS PER DAY AS DIRECTED IN INSULIN PUMP  . NP THYROID 120 MG tablet TAKE 1 TABLET BY MOUTH ONCE A DAY       Objective:   Today's Vitals: BP 140/80  Pulse 80   Ht _0  (1.626 m)   Wt 170 lb 12.8 oz (77.5 kg)   BMI 29.32 kg/m  Vitals with BMI 08/30/2019 10/21/2017 06/21/2017  Height _1  _2  _3   Weight 170 lbs 13 oz 183 lbs 6 oz 180 lbs  BMI 29.3 39.76 73.41  Systolic 937 902 409  Diastolic 80 78 88  Pulse 80 93 -     Physical Exam   She looks systemically well.  She has dropped about 3 pounds since the last time I saw her in August.  She does indeed have acne on her face at the present time.    Assessment   1. Type 1 diabetes mellitus without complication ([place])   2. Hypothyroidism, adult   3. PCOS (polycystic ovarian syndrome)       Tests ordered Orders Placed This Encounter  Procedures  . CMP with eGFR([facility])  . Hemoglobin A1c  . Follicle Stimulating Hormone  . Luteinizing Hormone  . Testos,Total,Free and SHBG  (Female)     Plan: 1. I wonder if she has PCOS and we will check blood work regarding this.  She may benefit from the use of spironolactone. 2. She will continue with insulin pump and I will check A1c today. 3. She will continue with desiccated NP thyroid for hypothyroidism and I do not think we need to check levels today as they have been optimal before. 4. She will continue to focus on nutrition as we have discussed before. 5. Follow-up in 3 months and further recommendations will depend on blood results.   No orders of the defined types were placed in this encounter.   Doree Albee, MD

## 2019-08-30 NOTE — Patient Instructions (Signed)
[name] Dietary Recommendations for Weight Loss What to Avoid . Avoid added sugars o Often added sugar can be found in processed foods such as many condiments, dry cereals, cakes, cookies, chips, crisps, crackers, candies, sweetened drinks, etc.  o Read labels and AVOID/DECREASE use of foods with the following in their ingredient list: Sugar, fructose, high fructose corn syrup, sucrose, glucose, maltose, dextrose, molasses, cane sugar, brown sugar, any type of syrup, agave nectar, etc.   . Avoid snacking in between meals . Avoid foods made with flour o If you are going to eat food made with flour, choose those made with whole-grains; and, minimize your consumption as much as is tolerable . Avoid processed foods o These foods are generally stocked in the middle of the grocery store. Focus on shopping on the perimeter of the grocery.  What to Include . Vegetables o GREEN LEAFY VEGETABLES: Kale, spinach, mustard greens, collard greens, cabbage, broccoli, etc. o OTHER: Asparagus, cauliflower, eggplant, carrots, peas, Brussel sprouts, tomatoes, bell peppers, zucchini, beets, cucumbers, etc. . Grains, seeds, and legumes o Beans: kidney beans, black eyed peas, garbanzo beans, black beans, pinto beans, etc. o Whole, unrefined grains: brown rice, barley, bulgur, oatmeal, etc. . Healthy fats  o Avoid highly processed fats such as vegetable oil o Examples of healthy fats: avocado, olives, virgin olive oil, dark chocolate (?72% Cocoa), nuts (peanuts, almonds, walnuts, cashews, pecans, etc.) . Low - Moderate Intake of Animal Sources of Protein o Meat sources: chicken, turkey, salmon, tuna. Limit to 4 ounces of meat at one time. o Consider limiting dairy sources, but when choosing dairy focus on: PLAIN Greek yogurt, cottage cheese, high-protein milk . Fruit o Choose berries  When to Eat . Intermittent Fasting: o Choosing not to eat for a specific time period, but DO FOCUS ON HYDRATION  when fasting o Multiple Techniques: - Time Restricted Eating: eat 3 meals in a day, each meal lasting no more than 60 minutes, no snacks between meals - 16-18 hour fast: fast for 16 to 18 hours up to 7 days a week. Often suggested to start with 2-3 nonconsecutive days per week.  . Remember the time you sleep is counted as fasting.  . Examples of eating schedule: Fast from 7:00pm-11:00am. Eat between 11:00am-7:00pm.  - 24-hour fast: fast for 24 hours up to every other day. Often suggested to start with 1 day per week . Remember the time you sleep is counted as fasting . Examples of eating schedule:  o Eating day: eat 2-3 meals on your eating day. If doing 2 meals, each meal should last no more than 90 minutes. If doing 3 meals, each meal should last no more than 60 minutes. Finish last meal by 7:00pm. o Fasting day: Fast until 7:00pm.  o IF YOU FEEL UNWELL FOR ANY REASON/IN ANY WAY WHEN FASTING, STOP FASTING BY EATING A NUTRITIOUS SNACK OR LIGHT MEAL o ALWAYS FOCUS ON HYDRATION DURING FASTS - Acceptable Hydration sources: water, broths, tea/coffee (black tea/coffee is best but using a small amount of whole-fat dairy products in coffee/tea is acceptable).  - Poor Hydration Sources: anything with sugar or artificial sweeteners added to it  These recommendations have been developed for patients that are actively receiving medical care from either Dr. Caryl [name] or Lindsey Gray, DNP, NP-C at [name]. These recommendations are developed for patients with specific medical conditions and are not meant to be distributed or used by others that are not actively receiving care from either provider listed   above at [name]. It is not appropriate to participate in the above eating plans without proper medical supervision.   Reference: Fung, J. The obesity code. [place]: [place]; 2016.   

## 2019-09-04 ENCOUNTER — Other Ambulatory Visit (INDEPENDENT_AMBULATORY_CARE_PROVIDER_SITE_OTHER): Payer: Self-pay | Admitting: Internal Medicine

## 2019-09-04 ENCOUNTER — Encounter (INDEPENDENT_AMBULATORY_CARE_PROVIDER_SITE_OTHER): Payer: Self-pay | Admitting: Internal Medicine

## 2019-09-04 MED ORDER — SPIRONOLACTONE 50 MG PO TABS: 50.0000 mg | ORAL_TABLET | Freq: Every day | ORAL | 3 refills | Status: DC

## 2019-09-04 MED FILL — SPIRONOLACTONE 50 MG TABS: 50 | 30 days supply | Qty: 30 | Fill #0

## 2019-09-12 LAB — COMPLETE METABOLIC PANEL WITH GFR
AG Ratio: 1.5 (calc) (ref 1.0–2.5)
ALT: 14 U/L (ref 6–29)
AST: 15 U/L (ref 10–30)
Albumin: 4.3 g/dL (ref 3.6–5.1)
Alkaline phosphatase (APISO): 58 U/L (ref 31–125)
BUN: 13 mg/dL (ref 7–25)
CO2: 24 mmol/L (ref 20–32)
Calcium: 9.9 mg/dL (ref 8.6–10.2)
Chloride: 102 mmol/L (ref 98–110)
Creat: 0.8 mg/dL (ref 0.50–1.10)
GFR, Est African American: 113 mL/min/{1.73_m2} (ref >=60)
GFR, Est Non African American: 98 mL/min/{1.73_m2} (ref >=60)
Globulin: 2.9 g/dL (calc) (ref 1.9–3.7)
Glucose, Bld: 161 mg/dL — ABNORMAL HIGH (ref 65–99)
Potassium: 4.1 mmol/L (ref 3.5–5.3)
Sodium: 140 mmol/L (ref 135–146)
Total Bilirubin: 0.6 mg/dL (ref 0.2–1.2)
Total Protein: 7.2 g/dL (ref 6.1–8.1)

## 2019-09-12 LAB — FOLLICLE STIMULATING HORMONE: FSH: 5.8 m[IU]/mL

## 2019-09-12 LAB — LUTEINIZING HORMONE: LH: 9 m[IU]/mL

## 2019-09-12 LAB — TESTOS,TOTAL,FREE AND SHBG (FEMALE)
Free Testosterone: 3.1 pg/mL (ref 0.1–6.4)
Sex Hormone Binding: 89 nmol/L (ref 17–124)
Testosterone, Total, LC-MS-MS: 43 ng/dL (ref 2–45)

## 2019-09-12 LAB — HEMOGLOBIN A1C
Hgb A1c MFr Bld: 7.2 % of total Hgb — ABNORMAL HIGH (ref <5.7)
Mean Plasma Glucose: 160 (calc)
eAG (mmol/L): 8.9 (calc)

## 2019-10-08 MED FILL — SPIRONOLACTONE 50 MG TABS: 50 | 30 days supply | Qty: 30 | Fill #1

## 2019-10-18 MED FILL — NP THYROID 120 MG TABLET: 120 | 90 days supply | Qty: 90 | Fill #1

## 2019-11-05 MED FILL — HumaLOG 100 UNIT/ML SOLN: 100 | 85 days supply | Qty: 60 | Fill #2

## 2019-11-05 MED FILL — SPIRONOLACTONE 50 MG TABLET: 50 | 30 days supply | Qty: 30 | Fill #2

## 2019-12-08 MED FILL — SPIRONOLACTONE 50 MG TABS: 50 | 30 days supply | Qty: 30 | Fill #3

## 2019-12-20 ENCOUNTER — Ambulatory Visit (INDEPENDENT_AMBULATORY_CARE_PROVIDER_SITE_OTHER): Payer: No Typology Code available for payment source | Admitting: Internal Medicine

## 2020-01-10 ENCOUNTER — Encounter (INDEPENDENT_AMBULATORY_CARE_PROVIDER_SITE_OTHER): Payer: Self-pay | Admitting: Internal Medicine

## 2020-01-10 ENCOUNTER — Ambulatory Visit (INDEPENDENT_AMBULATORY_CARE_PROVIDER_SITE_OTHER): Payer: No Typology Code available for payment source | Admitting: Internal Medicine

## 2020-01-10 ENCOUNTER — Other Ambulatory Visit: Payer: Self-pay

## 2020-01-10 VITALS — BP 100/80 | HR 101 | Temp 97.8°F | Ht 64.0 in | Wt 167.8 lb

## 2020-01-10 DIAGNOSIS — E109 Type 1 diabetes mellitus without complications: Secondary | ICD-10-CM

## 2020-01-10 DIAGNOSIS — E282 Polycystic ovarian syndrome: Secondary | ICD-10-CM

## 2020-01-10 DIAGNOSIS — R5381 Other malaise: Secondary | ICD-10-CM

## 2020-01-10 DIAGNOSIS — R5383 Other fatigue: Secondary | ICD-10-CM

## 2020-01-10 DIAGNOSIS — E039 Hypothyroidism, unspecified: Secondary | ICD-10-CM | POA: Diagnosis not present

## 2020-01-10 DIAGNOSIS — E559 Vitamin D deficiency, unspecified: Secondary | ICD-10-CM

## 2020-01-10 NOTE — Progress Notes (Signed)
Metrics: Intervention Frequency ACO  Documented Smoking Status Yearly  Screened one or more times in 24 months  Cessation Counseling or  Active cessation medication Past 24 months  Past 24 months   Guideline developer: UpToDate (See UpToDate for funding source) Date Released: 2014       Wellness Office Visit  Subjective:  Patient ID: Lindsey [name], female    DOB: 1986/02/17  Age: 34 y.o. MRN: [id number]  CC: This lady comes in for follow-up of her type 1 diabetes, hypothyroidism and PCOS. HPI  She is doing reasonably well.  She has not lost further weight.  She continues with her insulin pump and she is currently using approximately 42 units on a daily basis. She tries to do a degree of intermittent fasting on still eats mostly animal protein throughout the week. She is tolerating desiccated thyroid for hypothyroidism.  Her T3 levels could be somewhat better. Past Medical History:  Diagnosis Date  . Diabetes mellitus   . Diabetes mellitus affecting pregnancy in first trimester 03/16/2016  . Hypothyroidism, adult 08/30/2019  . Pregnant 03/16/2016  . Supervision of other high-risk pregnancy 03/31/2016    [facility] at   8+5 weeks FOB  Lindsey [name] 34 yo wm second child Dating By  LMP and [place] Pap  03/31/16 GC/CT Initial:                36+wks: Genetic Screen NT/IT:  CF screen  Anatomic [place]  Flu vaccine  Tdap Recommended ~ 28wks Glucose Screen  2 hr GBS  Feed Preference  Contraception  Circumcision  Childbirth Classes  Pediatrician        Family History  Problem Relation Age of Onset  . Hypertension Father   . Cancer Maternal Grandmother   . Cancer Maternal Grandfather   . 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   Social History   Tobacco Use  . Smoking status: Never Smoker  . Smokeless tobacco: Never Used  Substance Use  Topics  . Alcohol use: Yes    Alcohol/week: 2.0 standard drinks    Types: 1 Glasses of wine, 1 Shots of liquor per week    Comment: occ    Current Meds  Medication Sig  . cetirizine (ZYRTEC) 10 MG tablet Take 10 mg by mouth daily.  . Cholecalciferol (VITAMIN D-3) 125 MCG (5000 UT) TABS Take 3 tablets by mouth daily.  . Continuous Blood Gluc Sensor (FREESTYLE LIBRE 14 DAY SENSOR) MISC 1 each by Does not apply route every 14 (fourteen) days. Change every 2 weeks  . glucagon (GLUCAGON EMERGENCY) 1 MG injection Inject 1 mg into the muscle once as needed for up to 1 dose.  Marland Kitchen glucose blood (CONTOUR NEXT TEST) test strip Use as instructed to check blood sugar 4 times a day.  . insulin lispro (HUMALOG) 100 UNIT/ML injection USE UP TO 70 UNITS PER DAY AS DIRECTED IN INSULIN PUMP  . NP THYROID 120 MG tablet TAKE 1 TABLET BY MOUTH ONCE A DAY  . spironolactone (ALDACTONE) 50 MG tablet Take 1 tablet (50 mg total) by mouth daily.       Objective:   Today's Vitals: BP 100/80 (BP Location: Left Arm, Patient Position: Sitting, Cuff Size: Normal)   Pulse (!) 101   Temp 97.8 F (36.6 C) (Temporal)   Ht 5\' 4"  (1.626 m)   Wt 167  lb 12.8 oz (76.1 kg)   SpO2 98%   BMI 28.80 kg/m  Vitals with BMI 01/10/2020 08/30/2019 10/21/2017  Height 5\' 4"  5\' 4"  5\' 5"   Weight 167 lbs 13 oz 170 lbs 13 oz 183 lbs 6 oz  BMI 28.79 54.6 27.03  Systolic 500 938 182  Diastolic 80 80 78  Pulse 993 80 93     Physical Exam  She looks systemically well.  Blood pressure is excellent.  She has lost 3 pounds in weight, last visit.     Assessment   1. Type 1 diabetes mellitus without complication ([place])   2. Hypothyroidism, adult   3. PCOS (polycystic ovarian syndrome)   4. Malaise and fatigue   5. Vitamin D deficiency disease       Tests ordered Orders Placed This Encounter  Procedures  . COMPLETE METABOLIC PANEL WITH GFR  . Hemoglobin A1c  . T3, free  . VITAMIN D 25 Hydroxy (Vit-D Deficiency, Fractures)       Plan: 1. Blood work is ordered. 2. She will continue with insulin pump but she requires roughly 42 units a day.  The pump adjust the amount of insulin for as needed needs. 3. She will continue with desiccated NP thyroid as before and we will check T3 levels today. 4. She will continue with vitamin D3 50,000 units daily and we will check levels today. 5. Further recommendations will depend on blood results and I will see her in middle of August for an annual physical exam.   No orders of the defined types were placed in this encounter.   Doree Albee, MD

## 2020-01-11 LAB — HEMOGLOBIN A1C
Hgb A1c MFr Bld: 7.8 % of total Hgb — ABNORMAL HIGH (ref <5.7)
Mean Plasma Glucose: 177 (calc)
eAG (mmol/L): 9.8 (calc)

## 2020-01-11 LAB — T3, FREE: T3, Free: 3.5 pg/mL (ref 2.3–4.2)

## 2020-01-11 LAB — COMPLETE METABOLIC PANEL WITH GFR
AG Ratio: 1.5 (calc) (ref 1.0–2.5)
ALT: 12 U/L (ref 6–29)
AST: 13 U/L (ref 10–30)
Albumin: 4 g/dL (ref 3.6–5.1)
Alkaline phosphatase (APISO): 51 U/L (ref 31–125)
BUN: 13 mg/dL (ref 7–25)
CO2: 27 mmol/L (ref 20–32)
Calcium: 9.8 mg/dL (ref 8.6–10.2)
Chloride: 103 mmol/L (ref 98–110)
Creat: 0.76 mg/dL (ref 0.50–1.10)
GFR, Est African American: 119 mL/min/{1.73_m2} (ref >=60)
GFR, Est Non African American: 103 mL/min/{1.73_m2} (ref >=60)
Globulin: 2.7 g/dL (calc) (ref 1.9–3.7)
Glucose, Bld: 129 mg/dL — ABNORMAL HIGH (ref 65–99)
Potassium: 3.8 mmol/L (ref 3.5–5.3)
Sodium: 138 mmol/L (ref 135–146)
Total Bilirubin: 0.4 mg/dL (ref 0.2–1.2)
Total Protein: 6.7 g/dL (ref 6.1–8.1)

## 2020-01-11 LAB — VITAMIN D 25 HYDROXY (VIT D DEFICIENCY, FRACTURES): Vit D, 25-Hydroxy: 83 ng/mL (ref 30–100)

## 2020-01-12 ENCOUNTER — Other Ambulatory Visit (INDEPENDENT_AMBULATORY_CARE_PROVIDER_SITE_OTHER): Payer: Self-pay | Admitting: Internal Medicine

## 2020-01-12 MED FILL — SPIRONOLACTONE 50 MG TABLET: 50 | 30 days supply | Qty: 30 | Fill #0

## 2020-01-23 ENCOUNTER — Encounter (INDEPENDENT_AMBULATORY_CARE_PROVIDER_SITE_OTHER): Payer: Self-pay | Admitting: Internal Medicine

## 2020-01-23 ENCOUNTER — Telehealth (INDEPENDENT_AMBULATORY_CARE_PROVIDER_SITE_OTHER): Payer: Self-pay

## 2020-01-24 ENCOUNTER — Other Ambulatory Visit (INDEPENDENT_AMBULATORY_CARE_PROVIDER_SITE_OTHER): Payer: Self-pay | Admitting: Internal Medicine

## 2020-01-24 MED ORDER — THYROID 30 MG PO TABS: 30.0000 mg | ORAL_TABLET | Freq: Every day | ORAL | 3 refills | Status: DC

## 2020-01-24 MED FILL — NP THYROID 30 MG TABLET: 30 | 30 days supply | Qty: 30 | Fill #0

## 2020-01-24 NOTE — Telephone Encounter (Signed)
Pt email via [facility] system that is is correct on the unit and supply she is ordering.

## 2020-01-28 ENCOUNTER — Other Ambulatory Visit: Payer: Self-pay | Admitting: Internal Medicine

## 2020-01-28 ENCOUNTER — Other Ambulatory Visit (INDEPENDENT_AMBULATORY_CARE_PROVIDER_SITE_OTHER): Payer: Self-pay | Admitting: Internal Medicine

## 2020-01-28 MED ORDER — SPIRONOLACTONE 100 MG PO TABS: 100.0000 mg | ORAL_TABLET | Freq: Every day | ORAL | 3 refills | Status: DC

## 2020-01-28 MED FILL — SPIRONOLACTONE 100 MG TAB: 100 | 30 days supply | Qty: 30 | Fill #0

## 2020-01-28 MED FILL — HumaLOG 100 UNIT/ML SOLN: 100 | 85 days supply | Qty: 60 | Fill #0

## 2020-01-30 MED FILL — NP THYROID 120 MG TABLET: 120 | 90 days supply | Qty: 90 | Fill #2

## 2020-03-02 MED FILL — NP THYROID 30 MG TABLET: 30 | 30 days supply | Qty: 30 | Fill #1

## 2020-03-03 MED FILL — SPIRONOLACTONE 100 MG TAB: 100 | 30 days supply | Qty: 30 | Fill #1

## 2020-03-31 MED FILL — SPIRONOLACTONE 100 MG TAB: 100 | 30 days supply | Qty: 30 | Fill #2

## 2020-04-01 MED FILL — NP THYROID 30 MG TABLET: 30 | 30 days supply | Qty: 30 | Fill #2

## 2020-05-05 ENCOUNTER — Other Ambulatory Visit (INDEPENDENT_AMBULATORY_CARE_PROVIDER_SITE_OTHER): Payer: Self-pay | Admitting: Internal Medicine

## 2020-05-05 ENCOUNTER — Other Ambulatory Visit (HOSPITAL_COMMUNITY): Payer: Self-pay | Admitting: Internal Medicine

## 2020-05-05 MED FILL — NP THYROID 30 MG TABLET: 30 | 30 days supply | Qty: 30 | Fill #3

## 2020-05-05 MED FILL — SPIRONOLACTONE 100 MG TAB: 100 | 30 days supply | Qty: 30 | Fill #3

## 2020-05-05 MED FILL — HumaLOG 100 UNIT/ML SOLN: 100 | 85 days supply | Qty: 60 | Fill #1

## 2020-05-09 MED FILL — NP THYROID 60 MG TABLET: 60 | 90 days supply | Qty: 180 | Fill #0

## 2020-05-21 ENCOUNTER — Encounter (INDEPENDENT_AMBULATORY_CARE_PROVIDER_SITE_OTHER): Payer: Self-pay

## 2020-05-29 ENCOUNTER — Ambulatory Visit (INDEPENDENT_AMBULATORY_CARE_PROVIDER_SITE_OTHER): Payer: No Typology Code available for payment source | Admitting: Nurse Practitioner

## 2020-05-29 ENCOUNTER — Encounter (INDEPENDENT_AMBULATORY_CARE_PROVIDER_SITE_OTHER): Payer: Self-pay | Admitting: Nurse Practitioner

## 2020-05-29 ENCOUNTER — Other Ambulatory Visit: Payer: Self-pay

## 2020-05-29 VITALS — BP 120/70 | HR 89 | Temp 97.5°F | Resp 18 | Ht 64.0 in | Wt 166.0 lb

## 2020-05-29 DIAGNOSIS — E039 Hypothyroidism, unspecified: Secondary | ICD-10-CM | POA: Diagnosis not present

## 2020-05-29 DIAGNOSIS — Z0001 Encounter for general adult medical examination with abnormal findings: Secondary | ICD-10-CM

## 2020-05-29 DIAGNOSIS — Z1159 Encounter for screening for other viral diseases: Secondary | ICD-10-CM

## 2020-05-29 DIAGNOSIS — E109 Type 1 diabetes mellitus without complications: Secondary | ICD-10-CM

## 2020-05-29 DIAGNOSIS — R5381 Other malaise: Secondary | ICD-10-CM

## 2020-05-29 DIAGNOSIS — E559 Vitamin D deficiency, unspecified: Secondary | ICD-10-CM

## 2020-05-29 DIAGNOSIS — R5383 Other fatigue: Secondary | ICD-10-CM

## 2020-05-29 NOTE — Patient Instructions (Signed)
Try flonase nasal spray over the counter. Administer 1-2 sprays into each nostril every day for ~30 days. See if this helps with the ear drainage and itching.

## 2020-05-29 NOTE — Progress Notes (Signed)
Subjective:  Patient ID: Lindsey [name], female    DOB: 1985/12/07  Age: 34 y.o. MRN: [id number]  CC:  Chief Complaint  Patient presents with  . Annual Exam  . Hypothyroidism  . Diabetes  . Other    Vitamin D deficiency      HPI  Patient arrives today for the above.  Health maintenance: She is due for flu and pneumonia vaccination.  She is interested in having pneumonia vaccine administered today but is planning getting the flu vaccine administered at her place of employment.  She is due for Pap smear, and hepatitis C screening.  She is of childbearing age but tells me that her husband has had a vasectomy and she is not trying to have anymore children.  Thus, she is not interested in taking a folic acid supplement for sole purpose of preventing neural tube defects in subsequent pregnancies.  Hypothyroidism: She continues on her NP thyroid and is tolerating this well.  Diabetes: She does have history of type 1 diabetes and continues on insulin pump therapy.  She tolerating this well.  Last A1c was 7.8.  She is due for recheck today.  Vitamin D deficiency: She continues on vitamin D3 supplement for treatment of vitamin D deficiency.  Last serum check was collected about 4 months ago and it was 83.  Past Medical History:  Diagnosis Date  . Diabetes mellitus   . Diabetes mellitus affecting pregnancy in first trimester 03/16/2016  . Hypothyroidism, adult 08/30/2019  . Pregnant 03/16/2016  . Supervision of other high-risk pregnancy 03/31/2016    [facility] at   8+5 weeks FOB  Omari Koslosky 34 yo wm second child Dating By  LMP and [place] Pap  03/31/16 GC/CT Initial:                36+wks: Genetic Screen NT/IT:  CF screen  Anatomic [place]  Flu vaccine  Tdap Recommended ~ 28wks Glucose Screen  2 hr GBS  Feed Preference  Contraception  Circumcision  Childbirth Classes  Pediatrician        Family History  Problem Relation Age of Onset  . Hypertension Father   . Cancer  Maternal Grandmother   . Cancer Maternal Grandfather   . 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 Smoking status: Never Smoker  . Smokeless tobacco: Never Used  Substance Use Topics  . Alcohol use: Yes    Alcohol/week: 2.0 standard drinks    Types: 1 Glasses of wine, 1 Shots of liquor per week    Comment: occ     Current Meds  Medication Sig  . cetirizine (ZYRTEC) 10 MG tablet Take 10 mg by mouth daily.  . Cholecalciferol (VITAMIN D-3) 125 MCG (5000 UT) TABS Take 3 tablets by mouth daily.  . Continuous Blood Gluc Sensor (FREESTYLE LIBRE 14 DAY SENSOR) MISC 1 each by Does not apply route every 14 (fourteen) days. Change every 2 weeks  . glucagon (GLUCAGON EMERGENCY) 1 MG injection Inject 1 mg into the muscle once as needed for up to 1 dose.  Marland Kitchen glucose blood (CONTOUR NEXT TEST) test strip Use as instructed to check blood sugar 4 times a day.  . insulin lispro (HUMALOG) 100 UNIT/ML injection USE UP TO 70 UNITS PER DAY AS DIRECTED IN INSULIN PUMP  .  NP THYROID 120 MG tablet TAKE 1 TABLET BY MOUTH ONCE A DAY  . spironolactone (ALDACTONE) 100 MG tablet Take 1 tablet (100 mg total) by mouth daily.  Marland Kitchen thyroid (NP THYROID) 30 MG tablet Take 1 tablet (30 mg total) by mouth daily before breakfast.    ROS:  Review of Systems  Constitutional: Negative.   HENT: Positive for ear discharge.   Eyes: Negative.   Respiratory: Negative.   Cardiovascular: Negative.   Neurological: Negative.      Objective:   Today's Vitals: BP 120/70 (BP Location: Right Arm, Patient Position: Sitting, Cuff Size: Normal)   Pulse 89   Temp (!) 97.5 F (36.4 C) (Temporal)   Resp 18   Ht 5' 4"  (1.626 m)   Wt 166 lb (75.3 kg)   SpO2 98%   BMI 28.49 kg/m  Vitals with BMI 05/29/2020 01/10/2020 08/30/2019  Height 5' 4"  5' 4"  5' 4"   Weight  166 lbs 167 lbs 13 oz 170 lbs 13 oz  BMI 28.48 01.02 72.5  Systolic 366 440 347  Diastolic 70 80 80  Pulse 89 101 80     Physical Exam Vitals reviewed.  Constitutional:      General: She is not in acute distress.    Appearance: Normal appearance.  HENT:     Head: Normocephalic and atraumatic.     Right Ear: Hearing, tympanic membrane, ear canal and external ear normal.     Left Ear: Hearing, ear canal and external ear normal. A middle ear effusion is present.  Neck:     Vascular: No carotid bruit.  Cardiovascular:     Rate and Rhythm: Normal rate and regular rhythm.     Pulses: Normal pulses.          Dorsalis pedis pulses are 2+ on the right side and 2+ on the left side.     Heart sounds: Normal heart sounds.  Pulmonary:     Effort: Pulmonary effort is normal.     Breath sounds: Normal breath sounds.  Chest:     Breasts: Breasts are symmetrical.        Right: Normal.        Left: Normal.  Musculoskeletal:     Right foot: No deformity.     Left foot: No deformity.  Feet:     Right foot:     Protective Sensation: 10 sites tested. 10 sites sensed.     Skin integrity: Skin integrity normal.     Toenail Condition: Right toenails are normal.     Left foot:     Protective Sensation: 10 sites tested. 10 sites sensed.     Skin integrity: Skin integrity normal.     Toenail Condition: Left toenails are normal.  Lymphadenopathy:     Upper Body:     Right upper body: No supraclavicular adenopathy.     Left upper body: No supraclavicular adenopathy.  Skin:    General: Skin is warm and dry.  Neurological:     General: No focal deficit present.     Mental Status: She is alert and oriented to person, place, and time.  Psychiatric:        Mood and Affect: Mood normal.        Behavior: Behavior normal.        Judgment: Judgment normal.          Assessment and Plan   1. Encounter for general adult medical examination with abnormal findings   2. Type 1 diabetes  mellitus  without complication ([place])   3. Hypothyroidism, adult   4. Malaise and fatigue   5. Vitamin D deficiency disease   6. Encounter for hepatitis C screening test for low risk patient      Plan: 1., 6.  We will administer pneumonia vaccine today.  She will get a flu shot at her employment later on this year.  She will call her OB/GYN to schedule Pap smear.  We will screen for hepatitis C today.  She most likely would not start folic acid supplement.  Unless needed to for treatment of folic acid deficiency. 2.  She will continue on her current medication regimen for type I diabetes, will await A1c level for further recommendations. 3., 4.  She will continue on her NP thyroid, we will collect thyroid panel today for further evaluation. 5.  We will collect vitamin D serum level today for further evaluation.    Tests ordered Orders Placed This Encounter  Procedures  . CBC  . CMP with eGFR([facility])  . Hemoglobin A1c  . TSH  . T3, Free  . T4, Free  . Lipid Panel  . Vitamin D, 25-hydroxy  . Hep C Antibody      No orders of the defined types were placed in this encounter.   Patient to follow-up in 3 months for office visit and then in 1 year for physical.  In addition to performing an annual physical exam I also performed an office visit today.  Ailene Ards, NP

## 2020-05-30 LAB — T3, FREE: T3, Free: 4.6 pg/mL — ABNORMAL HIGH (ref 2.3–4.2)

## 2020-05-30 LAB — COMPLETE METABOLIC PANEL WITH GFR
AG Ratio: 1.6 (calc) (ref 1.0–2.5)
ALT: 16 U/L (ref 6–29)
AST: 18 U/L (ref 10–30)
Albumin: 4.7 g/dL (ref 3.6–5.1)
Alkaline phosphatase (APISO): 60 U/L (ref 31–125)
BUN: 14 mg/dL (ref 7–25)
CO2: 27 mmol/L (ref 20–32)
Calcium: 10 mg/dL (ref 8.6–10.2)
Chloride: 102 mmol/L (ref 98–110)
Creat: 0.77 mg/dL (ref 0.50–1.10)
GFR, Est African American: 118 mL/min/{1.73_m2} (ref >=60)
GFR, Est Non African American: 101 mL/min/{1.73_m2} (ref >=60)
Globulin: 2.9 g/dL (calc) (ref 1.9–3.7)
Glucose, Bld: 54 mg/dL — ABNORMAL LOW (ref 65–99)
Potassium: 4.2 mmol/L (ref 3.5–5.3)
Sodium: 139 mmol/L (ref 135–146)
Total Bilirubin: 0.7 mg/dL (ref 0.2–1.2)
Total Protein: 7.6 g/dL (ref 6.1–8.1)

## 2020-05-30 LAB — CBC
HCT: 45 % (ref 35.0–45.0)
Hemoglobin: 14.8 g/dL (ref 11.7–15.5)
MCH: 28.8 pg (ref 27.0–33.0)
MCHC: 32.9 g/dL (ref 32.0–36.0)
MCV: 87.5 fL (ref 80.0–100.0)
MPV: 9.5 fL (ref 7.5–12.5)
Platelets: 401 10*3/uL — ABNORMAL HIGH (ref 140–400)
RBC: 5.14 10*6/uL — ABNORMAL HIGH (ref 3.80–5.10)
RDW: 12.3 % (ref 11.0–15.0)
WBC: 8.3 10*3/uL (ref 3.8–10.8)

## 2020-05-30 LAB — HEMOGLOBIN A1C
Hgb A1c MFr Bld: 7.8 % of total Hgb — ABNORMAL HIGH (ref <5.7)
Mean Plasma Glucose: 177 (calc)
eAG (mmol/L): 9.8 (calc)

## 2020-05-30 LAB — LIPID PANEL
Cholesterol: 226 mg/dL — ABNORMAL HIGH (ref <200)
HDL: 60 mg/dL (ref >=50)
LDL Cholesterol (Calc): 137 mg/dL (calc) — ABNORMAL HIGH
Non-HDL Cholesterol (Calc): 166 mg/dL (calc) — ABNORMAL HIGH (ref <130)
Total CHOL/HDL Ratio: 3.8 (calc) (ref <5.0)
Triglycerides: 156 mg/dL — ABNORMAL HIGH (ref <150)

## 2020-05-30 LAB — TSH: TSH: 0.29 mIU/L — ABNORMAL LOW

## 2020-05-30 LAB — HEPATITIS C ANTIBODY
Hepatitis C Ab: NONREACTIVE
SIGNAL TO CUT-OFF: 0.01 (ref <1.00)

## 2020-05-30 LAB — VITAMIN D 25 HYDROXY (VIT D DEFICIENCY, FRACTURES): Vit D, 25-Hydroxy: 89 ng/mL (ref 30–100)

## 2020-05-30 LAB — T4, FREE: Free T4: 1.1 ng/dL (ref 0.8–1.8)

## 2020-06-02 NOTE — Progress Notes (Signed)
Called left voicemail for her today. Gave detail information and instructions. Ask pt to call me back if need be.

## 2020-06-09 ENCOUNTER — Other Ambulatory Visit (INDEPENDENT_AMBULATORY_CARE_PROVIDER_SITE_OTHER): Payer: Self-pay | Admitting: Internal Medicine

## 2020-06-09 MED FILL — SPIRONOLACTONE 100 MG TAB: 100 | 30 days supply | Qty: 30 | Fill #0

## 2020-06-09 MED FILL — NP THYROID 30 MG TABLET: 30 | 30 days supply | Qty: 30 | Fill #0

## 2020-06-26 ENCOUNTER — Encounter: Payer: No Typology Code available for payment source | Admitting: Adult Health

## 2020-07-08 ENCOUNTER — Other Ambulatory Visit (INDEPENDENT_AMBULATORY_CARE_PROVIDER_SITE_OTHER): Payer: Self-pay | Admitting: Internal Medicine

## 2020-07-08 DIAGNOSIS — E109 Type 1 diabetes mellitus without complications: Secondary | ICD-10-CM

## 2020-07-09 MED FILL — SPIRONOLACTONE 100 MG TAB: 100 | 30 days supply | Qty: 30 | Fill #1

## 2020-07-09 MED FILL — NP THYROID 30 MG TABLET: 30 | 30 days supply | Qty: 30 | Fill #1

## 2020-07-18 ENCOUNTER — Encounter: Payer: Self-pay | Admitting: Adult Health

## 2020-07-18 ENCOUNTER — Other Ambulatory Visit: Payer: Self-pay | Admitting: Adult Health

## 2020-07-18 ENCOUNTER — Other Ambulatory Visit (HOSPITAL_COMMUNITY)
Admission: RE | Admit: 2020-07-18 | Discharge: 2020-07-18 | Disposition: A | Discharge status: Home / Self Care | Payer: No Typology Code available for payment source | Source: Ambulatory Visit | Attending: Adult Health | Admitting: Adult Health

## 2020-07-18 ENCOUNTER — Other Ambulatory Visit: Payer: Self-pay

## 2020-07-18 ENCOUNTER — Ambulatory Visit (INDEPENDENT_AMBULATORY_CARE_PROVIDER_SITE_OTHER): Payer: No Typology Code available for payment source | Admitting: Adult Health

## 2020-07-18 VITALS — BP 127/75 | HR 88 | Ht 64.0 in | Wt 163.0 lb

## 2020-07-18 DIAGNOSIS — L709 Acne, unspecified: Secondary | ICD-10-CM

## 2020-07-18 DIAGNOSIS — Z01419 Encounter for gynecological examination (general) (routine) without abnormal findings: Secondary | ICD-10-CM | POA: Diagnosis not present

## 2020-07-18 MED ORDER — CLINDAMYCIN PHOSPHATE 1 % EX GEL: Freq: Two times a day (BID) | CUTANEOUS | 6 refills | Status: DC

## 2020-07-18 MED FILL — CLINDAMYCIN PHOSPHATE 1 % G: 1 | 15 days supply | Qty: 30 | Fill #0

## 2020-07-18 NOTE — Progress Notes (Signed)
  Subjective:     Patient ID: Lindsey [name], female   DOB: 01-06-1986, 34 y.o.   MRN: [id number]  HPI Lindsey [name] is a 34 year old white female, married, G2P2 in for pelvic and pap. She works for [employer] at [employer].  PCP is Dr Karilyn Cota.   Review of Systems Patient denies any headaches, hearing loss, fatigue, blurred vision, shortness of breath, chest pain, abdominal pain, problems with bowel movements, urination, or intercourse. No joint pain or mood swings. +acne  Reviewed past medical,surgical, social and family history. Reviewed medications and allergies.     Objective:   Physical Exam BP 127/75 (BP Location: Left Arm, Patient Position: Sitting, Cuff Size: Normal)   Pulse 88   Ht 5\' 4"  (1.626 m)   Wt 163 lb (73.9 kg)   LMP 07/01/2020   BMI 27.98 kg/m   Skin warm and dry.Pelvic: external genitalia is normal in appearance no lesions, vagina: pink with good moisture and rugae,urethra has no lesions or masses noted, cervix:smooth and bulbous,Pap with high risk HPV genotyping performed, uterus: normal size, shape and contour, non tender, no masses felt, adnexa: no masses or tenderness noted. Bladder is non tender and no masses felt.  AA is 2 Fall risk is low PHQ 2 score is 0 Examination chaperoned by 07/03/2020, RN.   Upstream - 07/18/20 0945      Pregnancy Intention Screening   Does the patient want to become pregnant in the next year? No    Does the patient's partner want to become pregnant in the next year? No    Would the patient like to discuss contraceptive options today? No      Contraception Wrap Up   Current Method Vasectomy    Contraception Counseling Provided No             Assessment:     1. Well woman exam with routine gynecological exam Pap sent Physical with PCP Labs with PCP Mammogram at 41 Pap in 3 years if normal  2. Encounter for gynecological examination with Papanicolaou smear of cervix Pap sent Pap in 3 years if normal   3. Acne,  unspecified acne type Will try clindagel Meds ordered this encounter  Medications  . clindamycin (CLINDAGEL) 1 % gel    Sig: Apply topically 2 (two) times daily.    Dispense:  30 g    Refill:  6    Order Specific Question:   Supervising Provider    Answer:   09/17/20 [2510]      Plan:     Pap in 3 years if normal

## 2020-07-23 LAB — CYTOLOGY - PAP
Comment: NEGATIVE
Diagnosis: NEGATIVE
Diagnosis: REACTIVE
High risk HPV: NEGATIVE

## 2020-08-06 MED FILL — HumaLOG 100 UNIT/ML SOLN: 100 | 85 days supply | Qty: 60 | Fill #2

## 2020-08-13 MED FILL — NP THYROID 30 MG TABLET: 30 | 30 days supply | Qty: 30 | Fill #2

## 2020-08-13 MED FILL — SPIRONOLACTONE 100 MG TAB: 100 | 30 days supply | Qty: 30 | Fill #2

## 2020-08-13 MED FILL — NP THYROID 60 MG TABLET: 60 | 90 days supply | Qty: 180 | Fill #1

## 2020-09-08 ENCOUNTER — Ambulatory Visit (INDEPENDENT_AMBULATORY_CARE_PROVIDER_SITE_OTHER): Payer: No Typology Code available for payment source | Admitting: Internal Medicine

## 2020-09-17 ENCOUNTER — Ambulatory Visit (INDEPENDENT_AMBULATORY_CARE_PROVIDER_SITE_OTHER): Payer: No Typology Code available for payment source | Admitting: Internal Medicine

## 2020-09-17 ENCOUNTER — Encounter (INDEPENDENT_AMBULATORY_CARE_PROVIDER_SITE_OTHER): Payer: Self-pay | Admitting: Internal Medicine

## 2020-09-17 ENCOUNTER — Other Ambulatory Visit: Payer: Self-pay

## 2020-09-17 VITALS — BP 118/70 | HR 100 | Temp 97.8°F | Ht 64.0 in | Wt 167.4 lb

## 2020-09-17 DIAGNOSIS — E282 Polycystic ovarian syndrome: Secondary | ICD-10-CM | POA: Diagnosis not present

## 2020-09-17 DIAGNOSIS — E039 Hypothyroidism, unspecified: Secondary | ICD-10-CM | POA: Diagnosis not present

## 2020-09-17 DIAGNOSIS — E782 Mixed hyperlipidemia: Secondary | ICD-10-CM | POA: Diagnosis not present

## 2020-09-17 DIAGNOSIS — E109 Type 1 diabetes mellitus without complications: Secondary | ICD-10-CM | POA: Diagnosis not present

## 2020-09-17 NOTE — Progress Notes (Signed)
Metrics: Intervention Frequency ACO  Documented Smoking Status Yearly  Screened one or more times in 24 months  Cessation Counseling or  Active cessation medication Past 24 months  Past 24 months   Guideline developer: UpToDate (See UpToDate for funding source) Date Released: 2014       Wellness Office Visit  Subjective:  Patient ID: Lindsey [name], female    DOB: [birth year]  Age: 34 y.o. MRN: [id number]  CC: This lady comes in for follow-up of type 1 diabetes and hypothyroidism.  She also has hyperlipidemia. HPI  She continues with insulin pump.  She continues with NP thyroid at the current dose.  She is tolerating this.  Nutrition is variable. Past Medical History:  Diagnosis Date  . Diabetes mellitus   . Diabetes mellitus affecting pregnancy in first trimester 03/16/2016  . Hypothyroidism, adult 08/30/2019  . Pregnant 03/16/2016  . Supervision of other high-risk pregnancy 03/31/2016    [facility] at   8+5 weeks FOB  Tyresa Prindiville 34 yo wm second child Dating By  LMP and [place] Pap  03/31/16 GC/CT Initial:                36+wks: Genetic Screen NT/IT:  CF screen  Anatomic [place]  Flu vaccine  Tdap Recommended ~ 28wks Glucose Screen  2 hr GBS  Feed Preference  Contraception  Circumcision  Childbirth Classes  Pediatrician     Past Surgical History:  Procedure Laterality Date  . CESAREAN SECTION N/A 10/29/2016   Procedure: PRIMARY CESAREAN SECTION;  Surgeon: Lazaro Arms, MD;  Location: [facility] BIRTHING SUITES;  Service: Obstetrics;  Laterality: N/A;  . NO PAST SURGERIES       Family History  Problem Relation Age of Onset  . Hypertension Father   . Cancer Maternal Grandmother   . Cancer Maternal Grandfather   . 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   Social History   Tobacco Use  . Smoking status: Never Smoker  . Smokeless  tobacco: Never Used  Substance Use Topics  . Alcohol use: Yes    Alcohol/week: 2.0 standard drinks    Types: 1 Glasses of wine, 1 Shots of liquor per week    Comment: occ    Current Meds  Medication Sig  . cetirizine (ZYRTEC) 10 MG tablet Take 10 mg by mouth daily.  . Cholecalciferol (VITAMIN D-3) 125 MCG (5000 UT) TABS Take 3 tablets by mouth daily.  . clindamycin (CLINDAGEL) 1 % gel Apply topically 2 (two) times daily. (Patient taking differently: Apply topically as needed. )  . glucagon (GLUCAGON EMERGENCY) 1 MG injection Inject 1 mg into the muscle once as needed for up to 1 dose.  Marland Kitchen glucose blood (CONTOUR NEXT TEST) test strip Use as instructed to check blood sugar 4 times a day.  . insulin lispro (HUMALOG) 100 UNIT/ML injection USE UP TO 70 UNITS PER DAY AS DIRECTED IN INSULIN PUMP  . NP THYROID 120 MG tablet TAKE 1 TABLET BY MOUTH ONCE A DAY  . NP THYROID 30 MG tablet TAKE 1 TABLET BY MOUTH ONCE A DAY BEFORE BREAKFAST  . spironolactone (ALDACTONE) 100 MG tablet TAKE 1 TABLET BY MOUTH DAILY.      Depression screen [facility] 2/9 07/18/2020 07/18/2020 01/10/2020 04/26/2016 11/27/2015  Decreased Interest 0 0 0 0 0  Down, Depressed, Hopeless 0 0 0 0 0  PHQ - 2 Score 0 0 0 0 0  Altered sleeping 1 - - - -  Tired, decreased energy 1 - - - -  Change in appetite 1 - - - -  Feeling bad or failure about yourself  0 - - - -  Trouble concentrating 0 - - - -  Moving slowly or fidgety/restless 0 - - - -  Suicidal thoughts 0 - - - -  PHQ-9 Score 3 - - - -     Objective:   Today's Vitals: BP 118/70   Pulse 100   Temp 97.8 F (36.6 C) (Temporal)   Ht 5\' 4"  (1.626 m)   Wt 167 lb 6.4 oz (75.9 kg)   SpO2 97%   BMI 28.73 kg/m  Vitals with BMI 09/17/2020 07/18/2020 05/29/2020  Height 5\' 4"  5\' 4"  5\' 4"   Weight 167 lbs 6 oz 163 lbs 166 lbs  BMI 28.72 27.97 28.48  Systolic 118 127 05/31/2020  Diastolic 70 75 70  Pulse 100 88 89     Physical Exam   She looks systemically well.  Although she appears  to have gained weight, it appears that body composition is improved.    Assessment   1. Type 1 diabetes mellitus without complication (HCC)   2. Hypothyroidism, adult   3. PCOS (polycystic ovarian syndrome)   4. Mixed hyperlipidemia       Tests ordered Orders Placed This Encounter  Procedures  . COMPLETE METABOLIC PANEL WITH GFR  . T3, free  . TSH  . Hemoglobin A1c  . Lipid panel     Plan: 1. She will continue with insulin pump and we will check an A1c today. 2. We will check thyroid function also to see whether we need to further optimize it, I suspect we will need to do that. 3. I will check a lipid panel. 4. Further recommendations will depend on blood results and I will see her in 3 months time for follow-up.  Recommended that she get COVID-19 booster.   No orders of the defined types were placed in this encounter.   , MD

## 2020-09-18 ENCOUNTER — Other Ambulatory Visit (INDEPENDENT_AMBULATORY_CARE_PROVIDER_SITE_OTHER): Payer: Self-pay | Admitting: Internal Medicine

## 2020-09-18 LAB — LIPID PANEL
Cholesterol: 200 mg/dL — ABNORMAL HIGH (ref <200)
HDL: 51 mg/dL (ref >=50)
LDL Cholesterol (Calc): 112 mg/dL (calc) — ABNORMAL HIGH
Non-HDL Cholesterol (Calc): 149 mg/dL (calc) — ABNORMAL HIGH (ref <130)
Total CHOL/HDL Ratio: 3.9 (calc) (ref <5.0)
Triglycerides: 244 mg/dL — ABNORMAL HIGH (ref <150)

## 2020-09-18 LAB — HEMOGLOBIN A1C
Hgb A1c MFr Bld: 8.5 % of total Hgb — ABNORMAL HIGH (ref <5.7)
Mean Plasma Glucose: 197 mg/dL
eAG (mmol/L): 10.9 mmol/L

## 2020-09-18 LAB — COMPLETE METABOLIC PANEL WITH GFR
AG Ratio: 1.6 (calc) (ref 1.0–2.5)
ALT: 14 U/L (ref 6–29)
AST: 13 U/L (ref 10–30)
Albumin: 4.2 g/dL (ref 3.6–5.1)
Alkaline phosphatase (APISO): 56 U/L (ref 31–125)
BUN: 14 mg/dL (ref 7–25)
CO2: 28 mmol/L (ref 20–32)
Calcium: 9.5 mg/dL (ref 8.6–10.2)
Chloride: 102 mmol/L (ref 98–110)
Creat: 0.71 mg/dL (ref 0.50–1.10)
GFR, Est African American: 130 mL/min/{1.73_m2} (ref >=60)
GFR, Est Non African American: 112 mL/min/{1.73_m2} (ref >=60)
Globulin: 2.7 g/dL (calc) (ref 1.9–3.7)
Glucose, Bld: 117 mg/dL (ref 65–139)
Potassium: 4 mmol/L (ref 3.5–5.3)
Sodium: 138 mmol/L (ref 135–146)
Total Bilirubin: 0.4 mg/dL (ref 0.2–1.2)
Total Protein: 6.9 g/dL (ref 6.1–8.1)

## 2020-09-18 LAB — T3, FREE: T3, Free: 4.3 pg/mL — ABNORMAL HIGH (ref 2.3–4.2)

## 2020-09-18 LAB — TSH: TSH: 0.44 mIU/L

## 2020-09-18 MED ORDER — NP THYROID 60 MG PO TABS: 60.0000 mg | ORAL_TABLET | Freq: Every day | ORAL | 3 refills | Status: DC

## 2020-09-20 MED FILL — NP THYROID 30 MG TABLET: 30 | 30 days supply | Qty: 30 | Fill #3

## 2020-09-20 MED FILL — spIRONOLACTONE 100 MG TAB: 100 | 30 days supply | Qty: 30 | Fill #3

## 2020-09-24 NOTE — Progress Notes (Signed)
Pt called and left a voice mail on mobile contact. Ask pt to go into mychart. So she will see her results & reply back that she agrees with the information.

## 2020-09-25 ENCOUNTER — Telehealth (INDEPENDENT_AMBULATORY_CARE_PROVIDER_SITE_OTHER): Payer: Self-pay

## 2020-09-25 NOTE — Telephone Encounter (Signed)
Hey! Just letting you know I got your message and I'm good with trying the new NP thyroid prescription. I'm confident that the results of this visit are more about my eating/sleeping/exercising habits (or lack thereof) but I'm not against trying more NP thyroid. I'll keep an eye out for the new prescription.     Thanks!!     Lindsey [name]

## 2020-10-21 ENCOUNTER — Other Ambulatory Visit (INDEPENDENT_AMBULATORY_CARE_PROVIDER_SITE_OTHER): Payer: Self-pay | Admitting: Internal Medicine

## 2020-10-21 MED ORDER — SPIRONOLACTONE 100 MG PO TABS: 100.0000 mg | ORAL_TABLET | Freq: Every day | ORAL | 1 refills | Status: DC

## 2020-10-21 MED FILL — spIRONOLACTONE 100 MG TAB: 100 | 90 days supply | Qty: 90 | Fill #0

## 2020-10-27 ENCOUNTER — Other Ambulatory Visit (INDEPENDENT_AMBULATORY_CARE_PROVIDER_SITE_OTHER): Payer: Self-pay | Admitting: Internal Medicine

## 2020-11-03 ENCOUNTER — Telehealth (INDEPENDENT_AMBULATORY_CARE_PROVIDER_SITE_OTHER): Payer: Self-pay

## 2020-11-03 ENCOUNTER — Other Ambulatory Visit (INDEPENDENT_AMBULATORY_CARE_PROVIDER_SITE_OTHER): Payer: Self-pay | Admitting: Internal Medicine

## 2020-11-03 MED ORDER — NP THYROID 120 MG PO TABS: 120.0000 mg | ORAL_TABLET | Freq: Every day | ORAL | 2 refills | Status: DC

## 2020-11-03 MED ORDER — NP THYROID 60 MG PO TABS: 60.0000 mg | ORAL_TABLET | Freq: Every day | ORAL | 2 refills | Status: DC

## 2020-11-03 MED ORDER — GLUCOSE BLOOD VI STRP: ORAL_STRIP | 4 refills | Status: DC

## 2020-11-03 MED FILL — NP THYROID 60 MG TABLET: 60 | 90 days supply | Qty: 90 | Fill #0

## 2020-11-03 MED FILL — NP THYROID 120 MG TABLET: 120 | 90 days supply | Qty: 90 | Fill #0

## 2020-11-03 NOTE — Telephone Encounter (Signed)
Just called patient and confirmed correct dosage for NP Thyroid medications. Patient verbalized an understanding.  Patient requested a refill of the following also to [facility] and stated that she does not go to Dr. Elvera Lennox any longer:  Accu Chek Guide Link test strips for her Medtronic Insulin Pump Use same instructions as was on the Contour strips: Use as instructed to check blood sugar 4 times a day. Quantity 400, refills 4  Thank you!

## 2020-11-03 NOTE — Telephone Encounter (Signed)
I think the patient is mistaken.  She should be taking NP thyroid 120 mg in the morning and NP thyroid 60 mg in the afternoon.  She should have prescription for both of these with refills.

## 2020-11-03 NOTE — Telephone Encounter (Signed)
Marchelle Folks called from [facility] and stated that the patient told them that she needs her NP Thyroid 30 mg because she takes 150 mg daily. I do not see the 30 mg dosage on her medication list.   I do see that this is what you want her to currently take and want to clarify before I call the pharmacy back. [phone]  Per your last lab notes: Therefore, you should take NP thyroid 120 mg in the morning and NP thyroid 60 mg tablet at lunchtime.  Please advise if this is the correct dosage.

## 2020-11-03 NOTE — Telephone Encounter (Signed)
Called [facility] and spoke to [place] and she needs a refill of the following:  Just now have back in stock: NP THYROID 120 MG tablet Please adjust instructions to take in AM  NP THYROID 60 MG tablet  Please adjust instructions to take at lunchtime/afternoon  Also calling patient to confirm correct medication dosage.

## 2020-11-03 NOTE — Telephone Encounter (Signed)
Okay, I have sent those prescriptions to [facility].  Thanks.

## 2020-11-06 ENCOUNTER — Other Ambulatory Visit: Payer: Self-pay | Admitting: Internal Medicine

## 2020-11-06 MED FILL — HumaLOG 100 UNIT/ML SOLN: 100 | 85 days supply | Qty: 60 | Fill #0

## 2020-11-17 ENCOUNTER — Encounter (INDEPENDENT_AMBULATORY_CARE_PROVIDER_SITE_OTHER): Payer: Self-pay | Admitting: Internal Medicine

## 2020-11-20 ENCOUNTER — Other Ambulatory Visit: Payer: No Typology Code available for payment source

## 2020-11-20 DIAGNOSIS — Z20822 Contact with and (suspected) exposure to covid-19: Secondary | ICD-10-CM

## 2020-11-21 LAB — NOVEL CORONAVIRUS, NAA: SARS-CoV-2, NAA: NOT DETECTED

## 2020-11-21 LAB — SARS-COV-2, NAA 2 DAY TAT

## 2020-11-26 MED FILL — ACCU-CHEK GUIDE TEST STRIP: 90 days supply | Qty: 400 | Fill #0

## 2020-12-17 ENCOUNTER — Encounter (INDEPENDENT_AMBULATORY_CARE_PROVIDER_SITE_OTHER): Payer: Self-pay | Admitting: Internal Medicine

## 2020-12-17 ENCOUNTER — Other Ambulatory Visit: Payer: Self-pay

## 2020-12-17 ENCOUNTER — Ambulatory Visit (INDEPENDENT_AMBULATORY_CARE_PROVIDER_SITE_OTHER): Payer: No Typology Code available for payment source | Admitting: Internal Medicine

## 2020-12-17 VITALS — BP 130/70 | HR 87 | Temp 97.7°F | Resp 18 | Ht 64.0 in | Wt 169.6 lb

## 2020-12-17 DIAGNOSIS — E039 Hypothyroidism, unspecified: Secondary | ICD-10-CM

## 2020-12-17 DIAGNOSIS — E782 Mixed hyperlipidemia: Secondary | ICD-10-CM | POA: Diagnosis not present

## 2020-12-17 DIAGNOSIS — E109 Type 1 diabetes mellitus without complications: Secondary | ICD-10-CM

## 2020-12-17 DIAGNOSIS — E282 Polycystic ovarian syndrome: Secondary | ICD-10-CM | POA: Diagnosis not present

## 2020-12-17 NOTE — Progress Notes (Signed)
Metrics: Intervention Frequency ACO  Documented Smoking Status Yearly  Screened one or more times in 24 months  Cessation Counseling or  Active cessation medication Past 24 months  Past 24 months   Guideline developer: UpToDate (See UpToDate for funding source) Date Released: 2014       Wellness Office Visit  Subjective:  Patient ID: Lindsey [name], female    DOB: August 18, 1986  Age: 35 y.o. MRN: [id number]  CC: This lady comes in for follow-up of type 1 diabetes, hypothyroidism, dyslipidemia HPI Overall, she says her diabetes is doing better because she has a continuous glucose monitor in place that seems to keep her on track in terms of what she is eating. Unfortunately, she was not able to consistently take NP thyroid 60 mg tablet at lunchtime so she wonders if she can take it in the evening. She continues on spironolactone for peripheral skin symptoms of PCOS.  Past Medical History:  Diagnosis Date  . Diabetes mellitus   . Diabetes mellitus affecting pregnancy in first trimester 03/16/2016  . Hypothyroidism, adult 08/30/2019  . Pregnant 03/16/2016  . Supervision of other high-risk pregnancy 03/31/2016    [facility] at   8+5 weeks FOB  Lavern Crimi 35 yo wm second child Dating By  LMP and [place] Pap  03/31/16 GC/CT Initial:                36+wks: Genetic Screen NT/IT:  CF screen  Anatomic [place]  Flu vaccine  Tdap Recommended ~ 28wks Glucose Screen  2 hr GBS  Feed Preference  Contraception  Circumcision  Childbirth Classes  Pediatrician     Past Surgical History:  Procedure Laterality Date  . CESAREAN SECTION N/A 10/29/2016   Procedure: PRIMARY CESAREAN SECTION;  Surgeon: Lazaro Arms, MD;  Location: [facility] BIRTHING SUITES;  Service: Obstetrics;  Laterality: N/A;  . NO PAST SURGERIES       Family History  Problem Relation Age of Onset  . Hypertension Father   . Cancer Maternal Grandmother   . Cancer Maternal Grandfather   . 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   Social History   Tobacco Use  . Smoking status: Never Smoker  . Smokeless tobacco: Never Used  Substance Use Topics  . Alcohol use: Yes    Alcohol/week: 2.0 standard drinks    Types: 1 Glasses of wine, 1 Shots of liquor per week    Comment: occ    Current Meds  Medication Sig  . cetirizine (ZYRTEC) 10 MG tablet Take 10 mg by mouth daily.  . Cholecalciferol (VITAMIN D-3) 125 MCG (5000 UT) TABS Take 3 tablets by mouth daily.  . clindamycin (CLINDAGEL) 1 % gel Apply topically 2 (two) times daily. (Patient taking differently: Apply topically as needed.)  . glucagon (GLUCAGON EMERGENCY) 1 MG injection Inject 1 mg into the muscle once as needed for up to 1 dose.  Marland Kitchen glucose blood test strip Use as instructed  . insulin lispro (HUMALOG) 100 UNIT/ML injection USE UP TO 70 UNITS PER DAY AS DIRECTED IN INSULIN PUMP  . NP THYROID 120 MG tablet Take 1 tablet (120 mg total) by mouth daily.  . NP THYROID 60 MG tablet Take 1 tablet (60 mg total) by mouth daily before lunch.  . spironolactone (ALDACTONE) 100 MG tablet Take 1 tablet (100 mg total) by mouth daily.  Flowsheet Row Office Visit from 07/18/2020 [facility]  PHQ-9 Total Score 3      Objective:   Today's Vitals: BP 130/70 (BP Location: Right Arm, Patient Position: Sitting, Cuff Size: Normal)   Pulse 87   Temp 97.7 F (36.5 C) (Temporal)   Resp 18   Ht 5\' 4"  (1.626 m)   Wt 169 lb 9.6 oz (76.9 kg)   SpO2 98%   BMI 29.11 kg/m  Vitals with BMI 12/17/2020 09/17/2020 07/18/2020  Height 5\' 4"  5\' 4"  5\' 4"   Weight 169 lbs 10 oz 167 lbs 6 oz 163 lbs  BMI 29.1 28.72 27.97  Systolic 130 118 09/17/2020  Diastolic 70 70 75  Pulse 87 100 88     Physical Exam  She looks systemically well.  She is overweight, has gained a couple of pounds but overall stable.  Blood pressure is in good  range.     Assessment   1. Type 1 diabetes mellitus without complication (HCC)   2. Hypothyroidism, adult   3. Mixed hyperlipidemia   4. PCOS (polycystic ovarian syndrome)       Tests ordered Orders Placed This Encounter  Procedures  . COMPLETE METABOLIC PANEL WITH GFR  . Hemoglobin A1c  . Microalbumin, urine     Plan: 1. To continue with insulin pump and we will check an A1c. 2. I have encouraged her to try for her to get the NP thyroid 60 mg tablet every day in addition to the NP thyroid 120 mg tablet. 3. Continue with spironolactone for PCOS skin side effects. 4. Further recommendations will depend on above results and I will see her in August for annual physical exam.   No orders of the defined types were placed in this encounter.   , MD

## 2020-12-18 LAB — HEMOGLOBIN A1C
Hgb A1c MFr Bld: 7.2 % of total Hgb — ABNORMAL HIGH (ref <5.7)
Mean Plasma Glucose: 160 mg/dL
eAG (mmol/L): 8.9 mmol/L

## 2020-12-18 LAB — COMPLETE METABOLIC PANEL WITH GFR
AG Ratio: 1.6 (calc) (ref 1.0–2.5)
ALT: 15 U/L (ref 6–29)
AST: 15 U/L (ref 10–30)
Albumin: 4.1 g/dL (ref 3.6–5.1)
Alkaline phosphatase (APISO): 50 U/L (ref 31–125)
BUN: 10 mg/dL (ref 7–25)
CO2: 27 mmol/L (ref 20–32)
Calcium: 9.3 mg/dL (ref 8.6–10.2)
Chloride: 104 mmol/L (ref 98–110)
Creat: 0.79 mg/dL (ref 0.50–1.10)
GFR, Est African American: 114 mL/min/{1.73_m2} (ref >=60)
GFR, Est Non African American: 98 mL/min/{1.73_m2} (ref >=60)
Globulin: 2.5 g/dL (calc) (ref 1.9–3.7)
Glucose, Bld: 94 mg/dL (ref 65–139)
Potassium: 3.7 mmol/L (ref 3.5–5.3)
Sodium: 140 mmol/L (ref 135–146)
Total Bilirubin: 0.4 mg/dL (ref 0.2–1.2)
Total Protein: 6.6 g/dL (ref 6.1–8.1)

## 2021-01-01 ENCOUNTER — Other Ambulatory Visit (HOSPITAL_BASED_OUTPATIENT_CLINIC_OR_DEPARTMENT_OTHER): Payer: Self-pay

## 2021-01-09 LAB — MICROALBUMIN, URINE: Microalb, Ur: <0.2 mg/dL

## 2021-01-13 ENCOUNTER — Other Ambulatory Visit (HOSPITAL_COMMUNITY): Payer: Self-pay

## 2021-01-23 ENCOUNTER — Other Ambulatory Visit (HOSPITAL_COMMUNITY): Payer: Self-pay

## 2021-01-23 MED FILL — Thyroid Tab 120 MG (2 Grain): ORAL | 90 days supply | Qty: 90 | Fill #0 | Status: CN

## 2021-01-23 MED FILL — Spironolactone Tab 100 MG: ORAL | 90 days supply | Qty: 90 | Fill #0 | Status: CN

## 2021-01-30 ENCOUNTER — Other Ambulatory Visit (HOSPITAL_COMMUNITY): Payer: Self-pay

## 2021-02-03 ENCOUNTER — Other Ambulatory Visit (HOSPITAL_COMMUNITY): Payer: Self-pay

## 2021-02-03 MED FILL — Spironolactone Tab 100 MG: ORAL | 90 days supply | Qty: 90 | Fill #0 | Status: AC

## 2021-02-03 MED FILL — Thyroid Tab 120 MG (2 Grain): ORAL | 90 days supply | Qty: 90 | Fill #0 | Status: AC

## 2021-02-03 MED FILL — Insulin Lispro Subcutaneous Soln 100 Unit/ML: SUBCUTANEOUS | 85 days supply | Qty: 60 | Fill #0 | Status: AC

## 2021-04-01 ENCOUNTER — Other Ambulatory Visit (HOSPITAL_COMMUNITY): Payer: Self-pay

## 2021-04-01 MED FILL — Thyroid Tab 60 MG (1 Grain): ORAL | 90 days supply | Qty: 90 | Fill #0 | Status: AC

## 2021-04-17 ENCOUNTER — Other Ambulatory Visit (HOSPITAL_COMMUNITY): Payer: Self-pay

## 2021-05-15 ENCOUNTER — Other Ambulatory Visit (HOSPITAL_COMMUNITY): Payer: Self-pay

## 2021-05-15 ENCOUNTER — Other Ambulatory Visit (INDEPENDENT_AMBULATORY_CARE_PROVIDER_SITE_OTHER): Payer: Self-pay | Admitting: Internal Medicine

## 2021-05-15 MED FILL — Thyroid Tab 120 MG (2 Grain): ORAL | 90 days supply | Qty: 90 | Fill #1 | Status: AC

## 2021-05-18 ENCOUNTER — Other Ambulatory Visit (HOSPITAL_COMMUNITY): Payer: Self-pay

## 2021-05-18 ENCOUNTER — Other Ambulatory Visit (INDEPENDENT_AMBULATORY_CARE_PROVIDER_SITE_OTHER): Payer: Self-pay | Admitting: Internal Medicine

## 2021-05-18 MED ORDER — SPIRONOLACTONE 100 MG PO TABS
ORAL_TABLET | Freq: Every day | ORAL | 0 refills | Status: DC
  Filled 2021-05-18: qty 90, 90d supply, fill #0

## 2021-06-04 ENCOUNTER — Encounter (INDEPENDENT_AMBULATORY_CARE_PROVIDER_SITE_OTHER): Payer: No Typology Code available for payment source | Admitting: Internal Medicine

## 2021-07-13 ENCOUNTER — Other Ambulatory Visit (HOSPITAL_COMMUNITY): Payer: Self-pay

## 2021-07-13 MED FILL — Thyroid Tab 60 MG (1 Grain): ORAL | 90 days supply | Qty: 90 | Fill #1 | Status: AC

## 2021-07-14 ENCOUNTER — Other Ambulatory Visit: Payer: Self-pay

## 2021-07-14 ENCOUNTER — Encounter: Payer: Self-pay | Admitting: Internal Medicine

## 2021-07-14 ENCOUNTER — Encounter (INDEPENDENT_AMBULATORY_CARE_PROVIDER_SITE_OTHER): Payer: No Typology Code available for payment source | Admitting: Internal Medicine

## 2021-07-14 NOTE — Progress Notes (Deleted)
Name: Lindsey [name]  MRN/ DOB: [id number], 14-May-1986   Age/ Sex: 35 y.o., female    PCP: Wilson Singer, MD (Inactive)   Reason for Endocrinology Evaluation: Type 1 Diabetes Mellitus     Date of Initial Endocrinology Visit: 07/14/2021     PATIENT IDENTIFIER: [name] is a 35 y.o. female with a past medical history of T1DM . The patient presented for initial endocrinology clinic visit on 07/14/2021 for consultative assistance with her diabetes management.    HPI: Ms. Parillo was    Diagnosed with DM at age 66 months  Prior Medications tried/Intolerance: *** Currently checking blood sugars *** x / day,  before breakfast and ***.  Hypoglycemia episodes : ***               Symptoms: ***                 Frequency: ***/  Hemoglobin A1c has ranged from *** in ***, peaking at *** in ***. Patient required assistance for hypoglycemia:  Patient has required hospitalization within the last 1 year from hyper or hypoglycemia:   In terms of diet, the patient ***    THYROID HISTORY :     HOME DIABETES REGIMEN: HUmalog  NP Thyroid 60 mg daily    Statin: no ACE-I/ARB: no Prior Diabetic Education: yes   METER DOWNLOAD SUMMARY: Date range evaluated: *** Fingerstick Blood Glucose Tests = *** Average Number Tests/Day = *** Overall Mean FS Glucose = *** Standard Deviation = ***  BG Ranges: Low = *** High = ***   Hypoglycemic Events/30 Days: BG < 50 = *** Episodes of symptomatic severe hypoglycemia = ***   DIABETIC COMPLICATIONS: Microvascular complications:  *** Denies: *** Last eye exam: Completed   Macrovascular complications:  *** Denies: CAD, PVD, CVA   PAST HISTORY: Past Medical History:  Past Medical History:  Diagnosis Date   Diabetes mellitus    Diabetes mellitus affecting pregnancy in first trimester 03/16/2016   Hypothyroidism, adult 08/30/2019   Pregnant 03/16/2016   Supervision of other high-risk pregnancy 03/31/2016    [facility] at   8+5 weeks FOB  Hassel Neth 35 yo wm second child Dating By  LMP and [place] Pap  03/31/16 GC/CT Initial:                36+wks: Genetic Screen NT/IT:  CF screen  Anatomic [place]  Flu vaccine  Tdap Recommended ~ 28wks Glucose Screen  2 hr GBS  Feed Preference  Contraception  Circumcision  Childbirth Classes  Pediatrician     Past Surgical History:  Past Surgical History:  Procedure Laterality Date   CESAREAN SECTION N/A 10/29/2016   Procedure: PRIMARY CESAREAN SECTION;  Surgeon: Lazaro Arms, MD;  Location: [facility] BIRTHING SUIT[facility];  Service: Obstetrics;  Laterality: N/A;   NO PAST SURGERIES      Social History:  reports that she has never smoked. She has never used smokeless tobacco. She reports current alcohol use of about 2.0 standard drinks per week. She reports that she does not use drugs. Family History:  Family History  Problem Relation Age of Onset   Hypertension Father    Cancer Maternal Grandmother    Cancer Maternal Grandfather    Alzheimer's disease Paternal Grandfather      HOME MEDICATIONS: Allergies as of 07/14/2021   No Known Allergies      Medication List        Accurate as of July 14, 2021 11:52 AM. If you have any questions, ask your nurse or doctor.          Accu-Chek Guide test strip Generic drug: glucose blood USE AS DIRECTED 4 TIMES DAILY   cetirizine 10 MG tablet Commonly known as: ZYRTEC Take 10 mg by mouth daily.   clindamycin 1 % gel Commonly known as: CLINDAGEL APPLY TOPICALLY 2 (TWO) TIMES DAILY. What changed:  when to take this reasons to take this   glucagon 1 MG injection Inject 1 mg into the muscle once as needed for up to 1 dose.   HumaLOG 100 UNIT/ML injection Generic drug: insulin lispro USE UP TO 70 UNITS PER DAY AS DIRECTED IN INSULIN PUMP   NP Thyroid 60 MG tablet Generic drug: thyroid TAKE 2 TABLETS BY MOUTH ONCE A DAY   NP Thyroid 60 MG tablet Generic drug: thyroid TAKE 1 TABLET BY MOUTH DAILY BEFORE  LUNCH   NP Thyroid 120 MG tablet Generic drug: thyroid TAKE 1 TABLET BY MOUTH DAILY   spironolactone 100 MG tablet Commonly known as: ALDACTONE TAKE 1 TABLET BY MOUTH ONCE A DAY   Vitamin D-3 125 MCG (5000 UT) Tabs Take 3 tablets by mouth daily.         ALLERGIES: No Known Allergies   REVIEW OF SYSTEMS: A comprehensive ROS was conducted with the patient and is negative except as per HPI and below:  ROS    OBJECTIVE:   VITAL SIGNS: There were no vitals taken for this visit.   PHYSICAL EXAM:  General: Pt appears well and is in NAD  Hydration: Well-hydrated with moist mucous membranes and good skin turgor  HEENT: Head: Unremarkable with good dentition. Oropharynx clear without exudate.  Eyes: External eye exam normal without stare, lid lag or exophthalmos.  EOM intact.  PERRL.  Neck: General: Supple without adenopathy or carotid bruits. Thyroid: Thyroid size normal.  No goiter or nodules appreciated. No thyroid bruit.  Lungs: Clear with good BS bilat with no rales, rhonchi, or wheezes  Heart: RRR with normal S1 and S2 and no gallops; no murmurs; no rub  Abdomen: Normoactive bowel sounds, soft, nontender, without masses or organomegaly palpable  Extremities:  Lower extremities - No pretibial edema. No lesions.  Skin: Normal texture and temperature to palpation. No rash noted. No Acanthosis nigricans/skin tags. No lipohypertrophy.  Neuro: MS is good with appropriate affect, pt is alert and Ox3    DM foot exam:    DATA REVIEWED:  Lab Results  Component Value Date   HGBA1C 7.2 (H) 12/17/2020   HGBA1C 8.5 (H) 09/17/2020   HGBA1C 7.8 (H) 05/29/2020   Lab Results  Component Value Date   MICROALBUR <0.2 01/08/2021   LDLCALC 112 (H) 09/17/2020   CREATININE 0.79 12/17/2020   No results found for: [facility]  Lab Results  Component Value Date   CHOL 200 (H) 09/17/2020   HDL 51 09/17/2020   LDLCALC 112 (H) 09/17/2020   TRIG 244 (H) 09/17/2020   CHOLHDL 3.9  09/17/2020        ASSESSMENT / PLAN / RECOMMENDATIONS:   1) Type *** Diabetes Mellitus, ***controlled, With*** complications - Most recent A1c of *** %. Goal A1c < *** %.  ***  Plan: GENERAL: ***  MEDICATIONS: ***  EDUCATION / INSTRUCTIONS: BG monitoring instructions: Patient is instructed to check her blood sugars *** times a day, ***. Call [facility] if: BG persistently < 70 or > 300. I reviewed the Rule of 15 for  the treatment of hypoglycemia in detail with the patient. Literature supplied.   2) Diabetic complications:  Eye: Does *** have known diabetic retinopathy.  Neuro/ Feet: Does *** have known diabetic peripheral neuropathy. Renal: Patient does *** have known baseline CKD. She is *** on an ACEI/ARB at present.Check urine albumin/creatinine ratio yearly starting at time of diagnosis. If albuminuria is positive, treatment is geared toward better glucose, blood pressure control and use of ACE inhibitors or ARBs. Monitor electrolytes and creatinine once to twice yearly.   3) Lipids: Patient is *** on a statin.      Signed electronically by: Lyndle Herrlich, MD  [facility]  [facility] [address] [place] [place], [place] [address] Phone: [phone] FAX: [phone]   CC: Wilson Singer, MD (Inactive) No address on file Phone: None  Fax: None    Return to Endocrinology clinic as below: Future Appointments  Date Time Provider Department Center  07/14/2021  3:00 PM Debany Vantol, Konrad Dolores, MD [id number]

## 2021-07-16 ENCOUNTER — Telehealth: Payer: Self-pay | Admitting: Internal Medicine

## 2021-07-16 NOTE — Telephone Encounter (Signed)
Patient needs to make a new patient appointment--put ok'd by Ardyth Harps in the notes.

## 2021-07-17 NOTE — Progress Notes (Signed)
Pt under the impression I provide primary care services, she declines endocrine services today because she will require a referral  through her insurance.   Visit cancelled

## 2021-08-07 ENCOUNTER — Other Ambulatory Visit (HOSPITAL_COMMUNITY): Payer: Self-pay

## 2021-08-07 MED ORDER — INSULIN LISPRO 100 UNIT/ML IJ SOLN
INTRAMUSCULAR | 0 refills | Status: DC
  Filled 2021-08-07: qty 60, 85d supply, fill #0

## 2021-08-11 ENCOUNTER — Other Ambulatory Visit: Payer: Self-pay

## 2021-08-11 ENCOUNTER — Ambulatory Visit (INDEPENDENT_AMBULATORY_CARE_PROVIDER_SITE_OTHER): Payer: No Typology Code available for payment source | Admitting: Internal Medicine

## 2021-08-11 ENCOUNTER — Other Ambulatory Visit (HOSPITAL_COMMUNITY): Payer: Self-pay

## 2021-08-11 ENCOUNTER — Encounter: Payer: Self-pay | Admitting: Internal Medicine

## 2021-08-11 VITALS — BP 124/80 | HR 105 | Temp 98.2°F | Ht 64.0 in | Wt 171.0 lb

## 2021-08-11 DIAGNOSIS — E1169 Type 2 diabetes mellitus with other specified complication: Secondary | ICD-10-CM | POA: Diagnosis not present

## 2021-08-11 DIAGNOSIS — E039 Hypothyroidism, unspecified: Secondary | ICD-10-CM | POA: Diagnosis not present

## 2021-08-11 DIAGNOSIS — L709 Acne, unspecified: Secondary | ICD-10-CM

## 2021-08-11 DIAGNOSIS — Z9641 Presence of insulin pump (external) (internal): Secondary | ICD-10-CM | POA: Diagnosis not present

## 2021-08-11 DIAGNOSIS — E785 Hyperlipidemia, unspecified: Secondary | ICD-10-CM | POA: Diagnosis not present

## 2021-08-11 DIAGNOSIS — E109 Type 1 diabetes mellitus without complications: Secondary | ICD-10-CM

## 2021-08-11 LAB — POCT GLYCOSYLATED HEMOGLOBIN (HGB A1C): Hemoglobin A1C: 9.1 % — AB (ref 4.0–5.6)

## 2021-08-11 MED ORDER — FREESTYLE LIBRE 3 SENSOR MISC
1.0000 | 2 refills | Status: DC
  Filled 2021-08-11: qty 2, 28d supply, fill #0
  Filled ????-??-??: fill #1

## 2021-08-11 MED ORDER — SPIRONOLACTONE 100 MG PO TABS
ORAL_TABLET | Freq: Every day | ORAL | 0 refills | Status: DC
  Filled 2021-08-11: qty 90, 90d supply, fill #0

## 2021-08-11 NOTE — Progress Notes (Signed)
Established Patient Office Visit     This visit occurred during the SARS-CoV-2 public health emergency.  Safety protocols were in place, including screening questions prior to the visit, additional usage of staff PPE, and extensive cleaning of exam room while observing appropriate contact time as indicated for disinfecting solutions.    CC/Reason for Visit: Establish care, discuss chronic medical conditions  HPI: Lindsey [name] is a 35 y.o. female who is coming in today for the above mentioned reasons. Past Medical History is significant for: Insulin-dependent diabetes on an insulin pump, hypothyroidism on NP thyroid she takes a 120 mg dose as well as a 60 mg dose, she also has a history of hyperlipidemia not on medications.  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  She is married, she has 2 children ages 23 and 21.  She does not smoke, she drinks alcohol only occasionally.  She has no known drug allergies, her past surgical history is significant for a C-section.  Family history significant for a father and a brother with hypertension, an uncle with type 1 diabetes and a paternal grandfather with dementia.  She takes spironolactone for acne.  She is due for her COVID booster, she has had her flu vaccine this year.   Past Medical/Surgical History: Past Medical History:  Diagnosis Date   Diabetes mellitus    Diabetes mellitus affecting pregnancy in first trimester 03/16/2016   Hypothyroidism, adult 08/30/2019   Pregnant 03/16/2016   Supervision of other high-risk pregnancy 03/31/2016    [facility] at   8+5 weeks FOB  Katie Faraone 35 yo wm second child Dating By  LMP and [place] Pap  03/31/16 GC/CT Initial:                36+wks: Genetic Screen NT/IT:  CF screen  Anatomic [place]  Flu vaccine  Tdap Recommended ~ 28wks Glucose Screen  2 hr GBS  Feed Preference  Contraception  Circumcision  Childbirth Classes  Pediatrician      Past Surgical History:  Procedure  Laterality Date   CESAREAN SECTION N/A 10/29/2016   Procedure: PRIMARY CESAREAN SECTION;  Surgeon: Lazaro Arms, MD;  Location: [facility] BIRTHING SUITES;  Service: Obstetrics;  Laterality: N/A;   NO PAST SURGERIES      Social History:  reports that she has never smoked. She has never used smokeless tobacco. She reports current alcohol use of about 2.0 standard drinks per week. She reports that she does not use drugs.  Allergies: No Known Allergies  Family History:  Family History  Problem Relation Age of Onset   Hypertension Father    Cancer Maternal Grandmother    Cancer Maternal Grandfather    Alzheimer's disease Paternal Grandfather      Current Outpatient Medications:    cetirizine (ZYRTEC) 10 MG tablet, Take 10 mg by mouth daily., Disp: , Rfl:    Cholecalciferol (VITAMIN D-3) 125 MCG (5000 UT) TABS, Take 3 tablets by mouth daily., Disp: , Rfl:    Continuous Blood Gluc Sensor (FREESTYLE LIBRE 3 SENSOR) MISC, 1 Device by Does not apply route every 14 (fourteen) days. Place 1 sensor on the skin every 14 days. Use to check glucose continuously, Disp: 2 each, Rfl: 2   glucagon (GLUCAGON EMERGENCY) 1 MG injection, Inject 1 mg into the muscle once as needed for up to 1 dose., Disp: 1 each, Rfl: 12   glucose blood test strip, USE AS DIRECTED 4 TIMES DAILY,  Disp: 400 strip, Rfl: 4   insulin lispro (HUMALOG) 100 UNIT/ML injection, Use up to 70 units per day as directed in insulin pump, Disp: 60 mL, Rfl: 0   NP THYROID 60 MG tablet, TAKE 1 TABLET BY MOUTH DAILY BEFORE LUNCH (Patient taking differently: 60 mg in the morning, at noon, and at bedtime. 2 tabs in morning and 1 tab at night), Disp: 90 tablet, Rfl: 2   NP THYROID 60 MG tablet, TAKE 2 TABLETS BY MOUTH ONCE A DAY, Disp: 180 tablet, Rfl: 2   spironolactone (ALDACTONE) 100 MG tablet, TAKE 1 TABLET BY MOUTH ONCE A DAY, Disp: 90 tablet, Rfl: 0  Review of Systems:  Constitutional: Denies fever, chills, diaphoresis, appetite change and  fatigue.  HEENT: Denies photophobia, eye pain, redness, hearing loss, ear pain, congestion, sore throat, rhinorrhea, sneezing, mouth sores, trouble swallowing, neck pain, neck stiffness and tinnitus.   Respiratory: Denies SOB, DOE, cough, chest tightness,  and wheezing.   Cardiovascular: Denies chest pain, palpitations and leg swelling.  Gastrointestinal: Denies nausea, vomiting, abdominal pain, diarrhea, constipation, blood in stool and abdominal distention.  Genitourinary: Denies dysuria, urgency, frequency, hematuria, flank pain and difficulty urinating.  Endocrine: Denies: hot or cold intolerance, sweats, changes in hair or nails, polyuria, polydipsia. Musculoskeletal: Denies myalgias, back pain, joint swelling, arthralgias and gait problem.  Skin: Denies pallor, rash and wound.  Neurological: Denies dizziness, seizures, syncope, weakness, light-headedness, numbness and headaches.  Hematological: Denies adenopathy. Easy bruising, personal or family bleeding history  Psychiatric/Behavioral: Denies suicidal ideation, mood changes, confusion, nervousness, sleep disturbance and agitation    Physical Exam: Vitals:   08/11/21 1458  BP: 124/80  Pulse: (!) 105  Temp: 98.2 F (36.8 C)  TempSrc: Oral  SpO2: 98%  Weight: 171 lb (77.6 kg)  Height: 5\' 4"  (1.626 m)    Body mass index is 29.35 kg/m.   Constitutional: NAD, calm, comfortable Eyes: PERRL, lids and conjunctivae normal ENMT: Mucous membranes are moist.  Respiratory: clear to auscultation bilaterally, no wheezing, no crackles. Normal respiratory effort. No accessory muscle use.  Cardiovascular: Regular rate and rhythm, no murmurs / rubs / gallops. No extremity edema. Neurologic: Grossly intact and nonfocal Psychiatric: Normal judgment and insight. Alert and oriented x 3. Normal mood.    Impression and Plan:  Type 1 diabetes mellitus without complication (HCC)  Insulin pump status -A1c is above goal at 9.1. -I have given  her a few samples of freestyle libre 3 as I believe she would benefit from continuous glucose monitoring, I have also placed referral for endocrinology.  Hypothyroidism, adult  - Plan: TSH, TSH  Acne, unspecified acne type  - Plan: spironolactone (ALDACTONE) 100 MG tablet  Hyperlipidemia associated with type 2 diabetes mellitus ([place])  - Plan: Lipid panel, Lipid panel -Consider statin therapy if LDL above 70 given diabetes.  Time spent: 34 minutes reviewing chart, interviewing and examining patient and formulating plan of care.   Patient Instructions  -Nice seeing you today!!  -Lab work today; will notify you once results are available.  -Let's work on lifestyle changes to see if we can improve the A1c. Referral for endocrinology is in.   Lelon Frohlich, MD [facility] at [facility]

## 2021-08-11 NOTE — Patient Instructions (Signed)
-  Nice seeing you today!!  -Lab work today; will notify you once results are available.  -Let's work on lifestyle changes to see if we can improve the A1c. Referral for endocrinology is in.

## 2021-08-12 ENCOUNTER — Other Ambulatory Visit: Payer: Self-pay | Admitting: Internal Medicine

## 2021-08-12 ENCOUNTER — Other Ambulatory Visit (HOSPITAL_COMMUNITY): Payer: Self-pay

## 2021-08-12 ENCOUNTER — Encounter: Payer: Self-pay | Admitting: Internal Medicine

## 2021-08-12 DIAGNOSIS — E1169 Type 2 diabetes mellitus with other specified complication: Secondary | ICD-10-CM

## 2021-08-12 DIAGNOSIS — E785 Hyperlipidemia, unspecified: Secondary | ICD-10-CM

## 2021-08-12 DIAGNOSIS — E039 Hypothyroidism, unspecified: Secondary | ICD-10-CM

## 2021-08-12 LAB — CBC WITH DIFFERENTIAL/PLATELET
Basophils Absolute: 0 10*3/uL (ref 0.0–0.1)
Basophils Relative: 0.4 % (ref 0.0–3.0)
Eosinophils Absolute: 0.1 10*3/uL (ref 0.0–0.7)
Eosinophils Relative: 1.5 % (ref 0.0–5.0)
HCT: 41 % (ref 36.0–46.0)
Hemoglobin: 13.8 g/dL (ref 12.0–15.0)
Lymphocytes Relative: 21.5 % (ref 12.0–46.0)
Lymphs Abs: 1.9 10*3/uL (ref 0.7–4.0)
MCHC: 33.8 g/dL (ref 30.0–36.0)
MCV: 85.7 fl (ref 78.0–100.0)
Monocytes Absolute: 0.5 10*3/uL (ref 0.1–1.0)
Monocytes Relative: 6.1 % (ref 3.0–12.0)
Neutro Abs: 6.1 10*3/uL (ref 1.4–7.7)
Neutrophils Relative %: 70.5 % (ref 43.0–77.0)
Platelets: 355 10*3/uL (ref 150.0–400.0)
RBC: 4.78 Mil/uL (ref 3.87–5.11)
RDW: 12.1 % (ref 11.5–15.5)
WBC: 8.6 10*3/uL (ref 4.0–10.5)

## 2021-08-12 LAB — LIPID PANEL
Cholesterol: 182 mg/dL (ref 0–200)
HDL: 47.5 mg/dL (ref >39.00)
LDL Cholesterol: 100 mg/dL — ABNORMAL HIGH (ref 0–99)
NonHDL: 134.86
Total CHOL/HDL Ratio: 4
Triglycerides: 175 mg/dL — ABNORMAL HIGH (ref 0.0–149.0)
VLDL: 35 mg/dL (ref 0.0–40.0)

## 2021-08-12 LAB — COMPREHENSIVE METABOLIC PANEL
ALT: 14 U/L (ref 0–35)
AST: 12 U/L (ref 0–37)
Albumin: 4.2 g/dL (ref 3.5–5.2)
Alkaline Phosphatase: 59 U/L (ref 39–117)
BUN: 11 mg/dL (ref 6–23)
CO2: 28 mEq/L (ref 19–32)
Calcium: 9.4 mg/dL (ref 8.4–10.5)
Chloride: 101 mEq/L (ref 96–112)
Creatinine, Ser: 0.75 mg/dL (ref 0.40–1.20)
GFR: 103.71 mL/min (ref >60.00)
Glucose, Bld: 223 mg/dL — ABNORMAL HIGH (ref 70–99)
Potassium: 3.6 mEq/L (ref 3.5–5.1)
Sodium: 138 mEq/L (ref 135–145)
Total Bilirubin: 0.4 mg/dL (ref 0.2–1.2)
Total Protein: 6.9 g/dL (ref 6.0–8.3)

## 2021-08-12 LAB — TSH: TSH: 0.02 u[IU]/mL — ABNORMAL LOW (ref 0.35–5.50)

## 2021-08-12 MED ORDER — ATORVASTATIN CALCIUM 20 MG PO TABS
20.0000 mg | ORAL_TABLET | Freq: Every day | ORAL | 1 refills | Status: DC
Start: 2021-08-12 — End: 2022-04-29
  Filled 2021-08-12: qty 90, 90d supply, fill #0

## 2021-08-12 MED ORDER — NP THYROID 60 MG PO TABS
120.0000 mg | ORAL_TABLET | Freq: Every day | ORAL | 2 refills | Status: DC
  Filled 2021-08-12 – 2021-09-23 (×4): qty 180, 90d supply, fill #0
  Filled 2021-12-30: qty 180, 90d supply, fill #1
  Filled 2022-04-05: qty 180, 90d supply, fill #2

## 2021-08-13 ENCOUNTER — Other Ambulatory Visit (HOSPITAL_COMMUNITY): Payer: Self-pay

## 2021-08-14 ENCOUNTER — Encounter: Payer: Self-pay | Admitting: Internal Medicine

## 2021-08-14 DIAGNOSIS — E785 Hyperlipidemia, unspecified: Secondary | ICD-10-CM

## 2021-08-14 DIAGNOSIS — E039 Hypothyroidism, unspecified: Secondary | ICD-10-CM

## 2021-08-14 DIAGNOSIS — E1169 Type 2 diabetes mellitus with other specified complication: Secondary | ICD-10-CM

## 2021-09-08 ENCOUNTER — Other Ambulatory Visit (HOSPITAL_COMMUNITY): Payer: Self-pay

## 2021-09-09 ENCOUNTER — Other Ambulatory Visit: Payer: Self-pay | Admitting: Internal Medicine

## 2021-09-09 ENCOUNTER — Other Ambulatory Visit (HOSPITAL_COMMUNITY): Payer: Self-pay

## 2021-09-09 DIAGNOSIS — E109 Type 1 diabetes mellitus without complications: Secondary | ICD-10-CM

## 2021-09-09 MED ORDER — FREESTYLE LIBRE 3 SENSOR MISC
1.0000 | 2 refills | Status: DC
  Filled 2021-09-09: qty 2, 28d supply, fill #0
  Filled 2021-10-09: qty 2, 28d supply, fill #1
  Filled 2021-11-06: qty 2, 28d supply, fill #2

## 2021-09-23 ENCOUNTER — Other Ambulatory Visit (HOSPITAL_COMMUNITY): Payer: Self-pay

## 2021-09-24 ENCOUNTER — Other Ambulatory Visit (HOSPITAL_COMMUNITY): Payer: Self-pay

## 2021-10-09 ENCOUNTER — Other Ambulatory Visit (HOSPITAL_COMMUNITY): Payer: Self-pay

## 2021-11-04 ENCOUNTER — Other Ambulatory Visit (INDEPENDENT_AMBULATORY_CARE_PROVIDER_SITE_OTHER): Payer: Self-pay | Admitting: Nurse Practitioner

## 2021-11-04 ENCOUNTER — Other Ambulatory Visit (HOSPITAL_COMMUNITY): Payer: Self-pay

## 2021-11-05 ENCOUNTER — Other Ambulatory Visit (HOSPITAL_COMMUNITY): Payer: Self-pay

## 2021-11-06 ENCOUNTER — Other Ambulatory Visit (HOSPITAL_COMMUNITY): Payer: Self-pay

## 2021-11-11 ENCOUNTER — Ambulatory Visit (INDEPENDENT_AMBULATORY_CARE_PROVIDER_SITE_OTHER): Payer: No Typology Code available for payment source | Admitting: Internal Medicine

## 2021-11-11 ENCOUNTER — Other Ambulatory Visit (HOSPITAL_COMMUNITY): Payer: Self-pay

## 2021-11-11 ENCOUNTER — Encounter: Payer: Self-pay | Admitting: Internal Medicine

## 2021-11-11 VITALS — BP 117/79 | HR 75 | Temp 98.5°F | Resp 18 | Ht 64.02 in | Wt 172.1 lb

## 2021-11-11 DIAGNOSIS — E039 Hypothyroidism, unspecified: Secondary | ICD-10-CM

## 2021-11-11 DIAGNOSIS — E109 Type 1 diabetes mellitus without complications: Secondary | ICD-10-CM | POA: Diagnosis not present

## 2021-11-11 DIAGNOSIS — E1169 Type 2 diabetes mellitus with other specified complication: Secondary | ICD-10-CM | POA: Diagnosis not present

## 2021-11-11 DIAGNOSIS — E785 Hyperlipidemia, unspecified: Secondary | ICD-10-CM

## 2021-11-11 LAB — POCT GLYCOSYLATED HEMOGLOBIN (HGB A1C): Hemoglobin A1C: 6.2 % — AB (ref 4.0–5.6)

## 2021-11-11 MED FILL — Insulin Lispro Inj Soln 100 Unit/ML: INTRAMUSCULAR | 85 days supply | Qty: 60 | Fill #0 | Status: AC

## 2021-11-11 NOTE — Progress Notes (Signed)
Established Patient Office Visit     This visit occurred during the SARS-CoV-2 public health emergency.  Safety protocols were in place, including screening questions prior to the visit, additional usage of staff PPE, and extensive cleaning of exam room while observing appropriate contact time as indicated for disinfecting solutions.    CC/Reason for Visit: 11-month follow-up chronic conditions  HPI: Lindsey [name] is a 36 y.o. female who is coming in today for the above mentioned reasons. Past Medical History is significant for: Insulin-dependent diabetes on an insulin pump, hypothyroidism and hyperlipidemia.  After last visit her NP thyroid dose was decreased due to an over suppressed TSH.  We have also prescribed a freestyle libre as a method for her to do continuous glucose monitoring.  Her A1c at last visit was 9.1.   Past Medical/Surgical History: Past Medical History:  Diagnosis Date   Diabetes mellitus    Diabetes mellitus affecting pregnancy in first trimester 03/16/2016   Hypothyroidism, adult 08/30/2019   Pregnant 03/16/2016   Supervision of other high-risk pregnancy 03/31/2016    [facility] at   8+5 weeks FOB  Doxie Petrini 36 yo wm second child Dating By  LMP and [place] Pap  03/31/16 GC/CT Initial:                36+wks: Genetic Screen NT/IT:  CF screen  Anatomic [place]  Flu vaccine  Tdap Recommended ~ 28wks Glucose Screen  2 hr GBS  Feed Preference  Contraception  Circumcision  Childbirth Classes  Pediatrician      Past Surgical History:  Procedure Laterality Date   CESAREAN SECTION N/A 10/29/2016   Procedure: PRIMARY CESAREAN SECTION;  Surgeon: Florian Buff, MD;  Location: [facility];  Service: Obstetrics;  Laterality: N/A;   NO PAST SURGERIES      Social History:  reports that she has never smoked. She has never used smokeless tobacco. She reports current alcohol use of about 2.0 standard drinks per week. She reports that she does not use  drugs.  Allergies: No Known Allergies  Family History:  Family History  Problem Relation Age of Onset   Hypertension Father    Cancer Maternal Grandmother    Cancer Maternal Grandfather    Alzheimer's disease Paternal Grandfather      Current Outpatient Medications:    atorvastatin (LIPITOR) 20 MG tablet, Take 1 tablet (20 mg total) by mouth daily., Disp: 90 tablet, Rfl: 1   cetirizine (ZYRTEC) 10 MG tablet, Take 10 mg by mouth daily., Disp: , Rfl:    Cholecalciferol (VITAMIN D-3) 125 MCG (5000 UT) TABS, Take 3 tablets by mouth daily., Disp: , Rfl:    Continuous Blood Gluc Sensor (FREESTYLE LIBRE 3 SENSOR) MISC, Place 1 sensor on the skin every 14 days as directed. Use to check glucose continuously, Disp: 2 each, Rfl: 2   glucagon (GLUCAGON EMERGENCY) 1 MG injection, Inject 1 mg into the muscle once as needed for up to 1 dose., Disp: 1 each, Rfl: 12   HUMALOG 100 UNIT/ML injection, Use up to 70 units per day as directed in insulin pump, Disp: 60 mL, Rfl: 0   NP THYROID 60 MG tablet, TAKE 1 TABLET BY MOUTH DAILY BEFORE LUNCH (Patient taking differently: 60 mg in the morning, at noon, and at bedtime. 2 tabs in morning and 1 tab at night), Disp: 90 tablet, Rfl: 2   NP THYROID 60 MG tablet, TAKE 2 TABLETS BY MOUTH ONCE A  DAY, Disp: 180 tablet, Rfl: 2   spironolactone (ALDACTONE) 100 MG tablet, TAKE 1 TABLET BY MOUTH ONCE A DAY, Disp: 90 tablet, Rfl: 0  Review of Systems:  Constitutional: Denies fever, chills, diaphoresis, appetite change and fatigue.  HEENT: Denies photophobia, eye pain, redness, hearing loss, ear pain, congestion, sore throat, rhinorrhea, sneezing, mouth sores, trouble swallowing, neck pain, neck stiffness and tinnitus.   Respiratory: Denies SOB, DOE, cough, chest tightness,  and wheezing.   Cardiovascular: Denies chest pain, palpitations and leg swelling.  Gastrointestinal: Denies nausea, vomiting, abdominal pain, diarrhea, constipation, blood in stool and abdominal  distention.  Genitourinary: Denies dysuria, urgency, frequency, hematuria, flank pain and difficulty urinating.  Endocrine: Denies: hot or cold intolerance, sweats, changes in hair or nails, polyuria, polydipsia. Musculoskeletal: Denies myalgias, back pain, joint swelling, arthralgias and gait problem.  Skin: Denies pallor, rash and wound.  Neurological: Denies dizziness, seizures, syncope, weakness, light-headedness, numbness and headaches.  Hematological: Denies adenopathy. Easy bruising, personal or family bleeding history  Psychiatric/Behavioral: Denies suicidal ideation, mood changes, confusion, nervousness, sleep disturbance and agitation    Physical Exam: Vitals:   11/11/21 1539  BP: 117/79  Pulse: 75  Resp: 18  Temp: 98.5 F (36.9 C)  SpO2: 100%  Weight: 172 lb 1.6 oz (78.1 kg)  Height: 5' 4.02" (1.626 m)    Body mass index is 29.53 kg/m.   Constitutional: NAD, calm, comfortable Eyes: PERRL, lids and conjunctivae normal ENMT: Mucous membranes are moist.  Respiratory: clear to auscultation bilaterally, no wheezing, no crackles. Normal respiratory effort. No accessory muscle use.  Cardiovascular: Regular rate and rhythm, no murmurs / rubs / gallops. No extremity edema.  Neurologic: Grossly intact and nonfocal Psychiatric: Normal judgment and insight. Alert and oriented x 3. Normal mood.    Impression and Plan:  Type 1 diabetes mellitus without complication ([place])  - Plan: POCT glycosylated hemoglobin (Hb A1C) -A1c shows significant improvement from 9.1 down to 6.2 today.  She believes it is due to the continuous glucose monitor and being more cognizant of what her levels are.  Hyperlipidemia associated with type 2 diabetes mellitus ([place]) -Lipid panel in November 2022 with a total cholesterol of 182, triglycerides 175 and LDL 100. -Goal LDL is less than 70, have advised use of statin, she declines for now, she would like to trial lifestyle modifications  first.  Hypothyroidism, adult  - Plan: TSH  Time spent: 30 minutes reviewing chart, interviewing and examining patient and formulating plan of care.   Lelon Frohlich, MD [facility] at [facility]

## 2021-11-11 NOTE — Progress Notes (Signed)
Pt presents for diabetes follow-up  

## 2021-11-12 ENCOUNTER — Other Ambulatory Visit (HOSPITAL_COMMUNITY): Payer: Self-pay

## 2021-11-12 ENCOUNTER — Other Ambulatory Visit: Payer: Self-pay | Admitting: Internal Medicine

## 2021-11-12 DIAGNOSIS — L709 Acne, unspecified: Secondary | ICD-10-CM

## 2021-11-12 LAB — TSH: TSH: 0.79 u[IU]/mL (ref 0.35–5.50)

## 2021-11-12 MED ORDER — SPIRONOLACTONE 100 MG PO TABS
ORAL_TABLET | Freq: Every day | ORAL | 1 refills | Status: DC
  Filled 2021-11-12: qty 90, 90d supply, fill #0
  Filled 2022-02-19: qty 90, 90d supply, fill #1

## 2021-12-03 ENCOUNTER — Other Ambulatory Visit (HOSPITAL_COMMUNITY): Payer: Self-pay

## 2021-12-03 ENCOUNTER — Other Ambulatory Visit: Payer: Self-pay | Admitting: Internal Medicine

## 2021-12-03 DIAGNOSIS — E109 Type 1 diabetes mellitus without complications: Secondary | ICD-10-CM

## 2021-12-03 MED ORDER — FREESTYLE LIBRE 3 SENSOR MISC
1.0000 | 2 refills | Status: DC
  Filled 2021-12-03: qty 2, 28d supply, fill #0
  Filled 2022-01-01: qty 2, 28d supply, fill #1
  Filled 2022-01-29: qty 2, 28d supply, fill #2

## 2021-12-30 ENCOUNTER — Other Ambulatory Visit (HOSPITAL_COMMUNITY): Payer: Self-pay

## 2022-01-01 ENCOUNTER — Other Ambulatory Visit (HOSPITAL_COMMUNITY): Payer: Self-pay

## 2022-01-29 ENCOUNTER — Other Ambulatory Visit (HOSPITAL_COMMUNITY): Payer: Self-pay

## 2022-02-02 ENCOUNTER — Other Ambulatory Visit (INDEPENDENT_AMBULATORY_CARE_PROVIDER_SITE_OTHER): Payer: Self-pay | Admitting: Nurse Practitioner

## 2022-02-11 ENCOUNTER — Other Ambulatory Visit (HOSPITAL_COMMUNITY): Payer: Self-pay

## 2022-02-19 ENCOUNTER — Other Ambulatory Visit (HOSPITAL_COMMUNITY): Payer: Self-pay

## 2022-02-20 ENCOUNTER — Other Ambulatory Visit (HOSPITAL_COMMUNITY): Payer: Self-pay

## 2022-02-20 ENCOUNTER — Other Ambulatory Visit (INDEPENDENT_AMBULATORY_CARE_PROVIDER_SITE_OTHER): Payer: Self-pay | Admitting: Nurse Practitioner

## 2022-02-22 ENCOUNTER — Other Ambulatory Visit (HOSPITAL_COMMUNITY): Payer: Self-pay

## 2022-02-25 ENCOUNTER — Encounter: Payer: Self-pay | Admitting: Internal Medicine

## 2022-02-25 ENCOUNTER — Other Ambulatory Visit (HOSPITAL_COMMUNITY): Payer: Self-pay

## 2022-02-25 MED ORDER — INSULIN LISPRO 100 UNIT/ML IJ SOLN
INTRAMUSCULAR | 2 refills | Status: DC
  Filled 2022-02-25: qty 60, 85d supply, fill #0
  Filled 2022-05-17: qty 60, 85d supply, fill #1
  Filled 2022-08-10: qty 60, 85d supply, fill #2

## 2022-02-26 ENCOUNTER — Other Ambulatory Visit (HOSPITAL_COMMUNITY): Payer: Self-pay

## 2022-02-26 ENCOUNTER — Other Ambulatory Visit: Payer: Self-pay | Admitting: Internal Medicine

## 2022-02-26 DIAGNOSIS — E109 Type 1 diabetes mellitus without complications: Secondary | ICD-10-CM

## 2022-02-26 MED ORDER — FREESTYLE LIBRE 3 SENSOR MISC
1.0000 | 2 refills | Status: DC
  Filled 2022-02-26: qty 2, 28d supply, fill #0
  Filled 2022-03-21: qty 2, 28d supply, fill #1

## 2022-03-22 ENCOUNTER — Other Ambulatory Visit (HOSPITAL_COMMUNITY): Payer: Self-pay

## 2022-03-24 ENCOUNTER — Other Ambulatory Visit (HOSPITAL_COMMUNITY): Payer: Self-pay

## 2022-04-05 ENCOUNTER — Other Ambulatory Visit (HOSPITAL_COMMUNITY): Payer: Self-pay

## 2022-04-11 ENCOUNTER — Encounter: Payer: Self-pay | Admitting: Internal Medicine

## 2022-04-11 DIAGNOSIS — E109 Type 1 diabetes mellitus without complications: Secondary | ICD-10-CM

## 2022-04-12 ENCOUNTER — Other Ambulatory Visit (HOSPITAL_COMMUNITY): Payer: Self-pay

## 2022-04-12 MED ORDER — FREESTYLE LIBRE 3 SENSOR MISC
1.0000 | 2 refills | Status: DC
  Filled 2022-04-12: qty 2, 28d supply, fill #0
  Filled 2022-05-17: qty 2, 28d supply, fill #1
  Filled 2022-06-11: qty 2, 28d supply, fill #2

## 2022-04-20 ENCOUNTER — Encounter: Payer: Self-pay | Admitting: Internal Medicine

## 2022-04-20 ENCOUNTER — Ambulatory Visit (INDEPENDENT_AMBULATORY_CARE_PROVIDER_SITE_OTHER): Payer: No Typology Code available for payment source | Admitting: Internal Medicine

## 2022-04-20 VITALS — BP 110/80 | HR 59 | Temp 98.1°F | Ht 64.0 in | Wt 182.0 lb

## 2022-04-20 DIAGNOSIS — E1069 Type 1 diabetes mellitus with other specified complication: Secondary | ICD-10-CM

## 2022-04-20 DIAGNOSIS — N920 Excessive and frequent menstruation with regular cycle: Secondary | ICD-10-CM | POA: Diagnosis not present

## 2022-04-20 DIAGNOSIS — Z0000 Encounter for general adult medical examination without abnormal findings: Secondary | ICD-10-CM | POA: Diagnosis not present

## 2022-04-20 DIAGNOSIS — E1169 Type 2 diabetes mellitus with other specified complication: Secondary | ICD-10-CM | POA: Diagnosis not present

## 2022-04-20 DIAGNOSIS — E039 Hypothyroidism, unspecified: Secondary | ICD-10-CM | POA: Diagnosis not present

## 2022-04-20 DIAGNOSIS — E785 Hyperlipidemia, unspecified: Secondary | ICD-10-CM | POA: Diagnosis not present

## 2022-04-20 DIAGNOSIS — N912 Amenorrhea, unspecified: Secondary | ICD-10-CM

## 2022-04-20 LAB — POCT URINE PREGNANCY: Preg Test, Ur: NEGATIVE

## 2022-04-20 NOTE — Patient Instructions (Signed)
-  Nice seeing you today!!  -Lab work today; will notify you once results are available.  -Remember your COVID vaccine.  -See you back in 6 months.

## 2022-04-20 NOTE — Progress Notes (Signed)
Established Patient Office Visit     CC/Reason for Visit: Annual preventive exam  HPI: Lindsey [name] is a 36 y.o. female who is coming in today for the above mentioned reasons. 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  She is feeling well today.  She has routine eye and dental care.  She is overdue a bivalent COVID-vaccine.  She had a Pap smear in 2021.  She had some vaginal bleeding 2 weeks after she finished her cycle.  She states her husband has had a vasectomy but did not go back for his postop check.   Past Medical/Surgical History: Past Medical History:  Diagnosis Date   Diabetes mellitus    Diabetes mellitus affecting pregnancy in first trimester 03/16/2016   Hypothyroidism, adult 08/30/2019   Pregnant 03/16/2016   Supervision of other high-risk pregnancy 03/31/2016    [facility] at   8+5 weeks FOB  Vincenta Steffey 36 yo wm second child Dating By  LMP and [place] Pap  03/31/16 GC/CT Initial:                36+wks: Genetic Screen NT/IT:  CF screen  Anatomic [place]  Flu vaccine  Tdap Recommended ~ 28wks Glucose Screen  2 hr GBS  Feed Preference  Contraception  Circumcision  Childbirth Classes  Pediatrician      Past Surgical History:  Procedure Laterality Date   CESAREAN SECTION N/A 10/29/2016   Procedure: PRIMARY CESAREAN SECTION;  Surgeon: Lazaro Arms, MD;  Location: [facility] BIRTHING SU[facility];  Service: Obstetrics;  Laterality: N/A;   NO PAST SURGERIES      Social History:  reports that she has never smoked. She has never used smokeless tobacco. She reports current alcohol use of about 2.0 standard drinks of alcohol per week. She reports that she does not use drugs.  Allergies: No Known Allergies  Family History:  Family History  Problem Relation Age of Onset   Hypertension Father    Cancer Maternal Grandmother    Cancer Maternal Grandfather    Alzheimer's disease Paternal  Grandfather      Current Outpatient Medications:    atorvastatin (LIPITOR) 20 MG tablet, Take 1 tablet (20 mg total) by mouth daily., Disp: 90 tablet, Rfl: 1   cetirizine (ZYRTEC) 10 MG tablet, Take 10 mg by mouth daily., Disp: , Rfl:    Cholecalciferol (VITAMIN D-3) 125 MCG (5000 UT) TABS, Take 3 tablets by mouth daily., Disp: , Rfl:    Continuous Blood Gluc Sensor (FREESTYLE LIBRE 3 SENSOR) MISC, Place 1 sensor on the skin every 14 days as directed. Use to check glucose continuously, Disp: 2 each, Rfl: 2   glucagon (GLUCAGON EMERGENCY) 1 MG injection, Inject 1 mg into the muscle once as needed for up to 1 dose., Disp: 1 each, Rfl: 12   insulin lispro (HUMALOG) 100 UNIT/ML injection, Use up to 70 units per day as directed in insulin pump, Disp: 60 mL, Rfl: 2   NP THYROID 60 MG tablet, TAKE 2 TABLETS BY MOUTH ONCE A DAY, Disp: 180 tablet, Rfl: 2   spironolactone (ALDACTONE) 100 MG tablet, TAKE 1 TABLET BY MOUTH ONCE A DAY, Disp: 90 tablet, Rfl: 1  Review of Systems:  Constitutional: Denies fever, chills, diaphoresis, appetite change and fatigue.  HEENT: Denies photophobia, eye pain, redness, hearing loss, ear pain, congestion, sore throat, rhinorrhea, sneezing, mouth sores, trouble swallowing, neck pain, neck stiffness and tinnitus.  Respiratory: Denies SOB, DOE, cough, chest tightness,  and wheezing.   Cardiovascular: Denies chest pain, palpitations and leg swelling.  Gastrointestinal: Denies nausea, vomiting, abdominal pain, diarrhea, constipation, blood in stool and abdominal distention.  Genitourinary: Denies dysuria, urgency, frequency, hematuria, flank pain and difficulty urinating.  Endocrine: Denies: hot or cold intolerance, sweats, changes in hair or nails, polyuria, polydipsia. Musculoskeletal: Denies myalgias, back pain, joint swelling, arthralgias and gait problem.  Skin: Denies pallor, rash and wound.  Neurological: Denies dizziness, seizures, syncope, weakness,  light-headedness, numbness and headaches.  Hematological: Denies adenopathy. Easy bruising, personal or family bleeding history  Psychiatric/Behavioral: Denies suicidal ideation, mood changes, confusion, nervousness, sleep disturbance and agitation    Physical Exam: Vitals:   04/20/22 1432  BP: 110/80  Pulse: (!) 59  Temp: 98.1 F (36.7 C)  TempSrc: Oral  SpO2: 99%  Weight: 182 lb (82.6 kg)  Height: 5\' 4"  (1.626 m)    Body mass index is 31.24 kg/m.   Constitutional: NAD, calm, comfortable Eyes: PERRL, lids and conjunctivae normal ENMT: Mucous membranes are moist. Posterior pharynx clear of any exudate or lesions. Normal dentition. Tympanic membrane is pearly white, no erythema or bulging. Neck: normal, supple, no masses, no thyromegaly Respiratory: clear to auscultation bilaterally, no wheezing, no crackles. Normal respiratory effort. No accessory muscle use.  Cardiovascular: Regular rate and rhythm, no murmurs / rubs / gallops. No extremity edema. 2+ pedal pulses. No carotid bruits.  Abdomen: no tenderness, no masses palpated. No hepatosplenomegaly. Bowel sounds positive.  Musculoskeletal: no clubbing / cyanosis. No joint deformity upper and lower extremities. Good ROM, no contractures. Normal muscle tone.  Skin: no rashes, lesions, ulcers. No induration Neurologic: CN 2-12 grossly intact. Sensation intact, DTR normal. Strength 5/5 in all 4.  Psychiatric: Normal judgment and insight. Alert and oriented x 3. Normal mood.    Diabetic Foot Exam - Simple   Simple Foot Form Diabetic Foot exam was performed with the following findings: Yes 04/20/2022  3:11 PM  Visual Inspection No deformities, no ulcerations, no other skin breakdown bilaterally: Yes Sensation Testing Intact to touch and monofilament testing bilaterally: Yes Pulse Check Posterior Tibialis and Dorsalis pulse intact bilaterally: Yes Comments      Impression and Plan:  Encounter for preventive health  examination -Recommend routine eye and dental care. -Immunizations: Advised bivalent COVID-vaccine, other immunizations are up-to-date -Healthy lifestyle discussed in detail. -Labs to be updated today. -Colon cancer screening: Commence age 17 -Breast cancer screening: Commence age 92 -Cervical cancer screening: 07/2020 -Lung cancer screening: Not applicable -Prostate cancer screening: Not applicable -DEXA: Not applicable  Menorrhagia with regular cycle  - Plan: POCT urine pregnancy is equivocal, will send for serum pregnancy test.  Type 1 diabetes mellitus with other specified complication (HCC)  - Plan: Urine microalbumin-creatinine with uACR, CBC with Differential/Platelet, Comprehensive metabolic panel, Hemoglobin A1c, Vitamin B12, VITAMIN D 25 Hydroxy (Vit-D Deficiency, Fractures)  Hypothyroidism, adult  - Plan: TSH  Hyperlipidemia associated with type 2 diabetes mellitus (HCC)  - Plan: Lipid panel     Patient Instructions  -Nice seeing you today!!  -Lab work today; will notify you once results are available.  -Remember your COVID vaccine.  -See you back in 6 months.      08/2020, MD [facility]

## 2022-04-21 ENCOUNTER — Telehealth: Payer: Self-pay | Admitting: *Deleted

## 2022-04-21 LAB — LIPID PANEL
Cholesterol: 230 mg/dL — ABNORMAL HIGH (ref 0–200)
HDL: 69.5 mg/dL (ref >39.00)
LDL Cholesterol: 147 mg/dL — ABNORMAL HIGH (ref 0–99)
NonHDL: 160.71
Total CHOL/HDL Ratio: 3
Triglycerides: 70 mg/dL (ref 0.0–149.0)
VLDL: 14 mg/dL (ref 0.0–40.0)

## 2022-04-21 LAB — TSH: TSH: 4.84 u[IU]/mL (ref 0.35–5.50)

## 2022-04-21 LAB — CBC WITH DIFFERENTIAL/PLATELET
Basophils Absolute: 0 10*3/uL (ref 0.0–0.1)
Basophils Relative: 0.4 % (ref 0.0–3.0)
Eosinophils Absolute: 0.2 10*3/uL (ref 0.0–0.7)
Eosinophils Relative: 1.8 % (ref 0.0–5.0)
HCT: 41.5 % (ref 36.0–46.0)
Hemoglobin: 14 g/dL (ref 12.0–15.0)
Lymphocytes Relative: 21 % (ref 12.0–46.0)
Lymphs Abs: 2.1 10*3/uL (ref 0.7–4.0)
MCHC: 33.8 g/dL (ref 30.0–36.0)
MCV: 88.9 fl (ref 78.0–100.0)
Monocytes Absolute: 0.6 10*3/uL (ref 0.1–1.0)
Monocytes Relative: 5.6 % (ref 3.0–12.0)
Neutro Abs: 7.2 10*3/uL (ref 1.4–7.7)
Neutrophils Relative %: 71.2 % (ref 43.0–77.0)
Platelets: 355 10*3/uL (ref 150.0–400.0)
RBC: 4.66 Mil/uL (ref 3.87–5.11)
RDW: 12.8 % (ref 11.5–15.5)
WBC: 10.1 10*3/uL (ref 4.0–10.5)

## 2022-04-21 LAB — COMPREHENSIVE METABOLIC PANEL
ALT: 18 U/L (ref 0–35)
AST: 18 U/L (ref 0–37)
Albumin: 4.5 g/dL (ref 3.5–5.2)
Alkaline Phosphatase: 53 U/L (ref 39–117)
BUN: 11 mg/dL (ref 6–23)
CO2: 26 mEq/L (ref 19–32)
Calcium: 9.6 mg/dL (ref 8.4–10.5)
Chloride: 99 mEq/L (ref 96–112)
Creatinine, Ser: 0.82 mg/dL (ref 0.40–1.20)
GFR: 92.73 mL/min (ref >60.00)
Glucose, Bld: 51 mg/dL — ABNORMAL LOW (ref 70–99)
Potassium: 3.5 mEq/L (ref 3.5–5.1)
Sodium: 136 mEq/L (ref 135–145)
Total Bilirubin: 0.7 mg/dL (ref 0.2–1.2)
Total Protein: 7.6 g/dL (ref 6.0–8.3)

## 2022-04-21 LAB — HEMOGLOBIN A1C: Hgb A1c MFr Bld: 6.9 % — ABNORMAL HIGH (ref 4.6–6.5)

## 2022-04-21 LAB — MICROALBUMIN / CREATININE URINE RATIO
Creatinine,U: 29.6 mg/dL
Microalb Creat Ratio: 2.4 mg/g (ref 0.0–30.0)
Microalb, Ur: <0.7 mg/dL (ref 0.0–1.9)

## 2022-04-21 LAB — HCG, SERUM, QUALITATIVE: Preg, Serum: NEGATIVE

## 2022-04-21 LAB — VITAMIN D 25 HYDROXY (VIT D DEFICIENCY, FRACTURES): VITD: >120 ng/mL

## 2022-04-21 LAB — VITAMIN B12: Vitamin B-12: 249 pg/mL (ref 211–911)

## 2022-04-21 NOTE — Telephone Encounter (Signed)
CRITICAL VALUE STICKER  CRITICAL VALUE:vit d is greater than 120  RECEIVER (on-site recipient of call): Fleet Contras  DATE & TIME NOTIFIED: 04/21/22 11:50   MESSENGER (representative from lab): Blima Singer  MD NOTIFIED: Dr Caryl Never  TIME OF NOTIFICATION:04/21/22 11:51  RESPONSE:  waiting on response

## 2022-04-21 NOTE — Telephone Encounter (Signed)
Patient is aware 

## 2022-04-21 NOTE — Telephone Encounter (Signed)
Left message on machine for patient to return our call 

## 2022-04-29 ENCOUNTER — Other Ambulatory Visit (HOSPITAL_COMMUNITY): Payer: Self-pay

## 2022-04-29 ENCOUNTER — Other Ambulatory Visit: Payer: Self-pay | Admitting: Internal Medicine

## 2022-04-29 DIAGNOSIS — E1169 Type 2 diabetes mellitus with other specified complication: Secondary | ICD-10-CM

## 2022-04-29 MED ORDER — ATORVASTATIN CALCIUM 40 MG PO TABS
40.0000 mg | ORAL_TABLET | Freq: Every day | ORAL | 1 refills | Status: DC
  Filled 2022-04-29: qty 90, 90d supply, fill #0
  Filled 2022-07-25: qty 90, 90d supply, fill #1

## 2022-05-04 ENCOUNTER — Other Ambulatory Visit: Payer: Self-pay | Admitting: *Deleted

## 2022-05-04 DIAGNOSIS — E559 Vitamin D deficiency, unspecified: Secondary | ICD-10-CM

## 2022-05-04 DIAGNOSIS — E1169 Type 2 diabetes mellitus with other specified complication: Secondary | ICD-10-CM

## 2022-05-07 ENCOUNTER — Telehealth: Payer: Self-pay | Admitting: Internal Medicine

## 2022-05-07 ENCOUNTER — Encounter: Payer: Self-pay | Admitting: Internal Medicine

## 2022-05-07 ENCOUNTER — Telehealth: Payer: No Typology Code available for payment source | Admitting: Emergency Medicine

## 2022-05-07 DIAGNOSIS — J029 Acute pharyngitis, unspecified: Secondary | ICD-10-CM | POA: Diagnosis not present

## 2022-05-07 DIAGNOSIS — Z20818 Contact with and (suspected) exposure to other bacterial communicable diseases: Secondary | ICD-10-CM

## 2022-05-07 MED ORDER — CEFDINIR 300 MG PO CAPS: 300.0000 mg | ORAL_CAPSULE | Freq: Two times a day (BID) | ORAL | 0 refills | Status: AC

## 2022-05-07 NOTE — Telephone Encounter (Signed)
See patient message from today 

## 2022-05-07 NOTE — Telephone Encounter (Signed)
Pt called wanting to know if it was possible for MD to prescribe antibiotics for her sore throat  Last OV:  CPE on 04/20/22

## 2022-05-07 NOTE — Telephone Encounter (Signed)
[name] Phone:  [phone]  Fax:  [phone]

## 2022-05-07 NOTE — Progress Notes (Signed)
Virtual Visit Consent   Lindsey [name], you are scheduled for a virtual visit with a [facility] provider today. Just as with appointments in the office, your consent must be obtained to participate. Your consent will be active for this visit and any virtual visit you may have with one of our providers in the next 365 days. If you have a MyChart account, a copy of this consent can be sent to you electronically.  As this is a virtual visit, video technology does not allow for your provider to perform a traditional examination. This may limit your provider's ability to fully assess your condition. If your provider identifies any concerns that need to be evaluated in person or the need to arrange testing (such as labs, EKG, etc.), we will make arrangements to do so. Although advances in technology are sophisticated, we cannot ensure that it will always work on either your end or our end. If the connection with a video visit is poor, the visit may have to be switched to a telephone visit. With either a video or telephone visit, we are not always able to ensure that we have a secure connection.  By engaging in this virtual visit, you consent to the provision of healthcare and authorize for your insurance to be billed (if applicable) for the services provided during this visit. Depending on your insurance coverage, you may receive a charge related to this service.  I need to obtain your verbal consent now. Are you willing to proceed with your visit today? Lindsey [name] has provided verbal consent on 05/07/2022 for a virtual visit (video or telephone). Roxy Horseman, PA-C  Date: 05/07/2022 12:35 PM  Virtual Visit via Video Note   I, Roxy Horseman, connected with  Lindsey [name]  ([id number], Feb 11, 1986) on 05/07/22 at 12:45 PM EDT by a video-enabled telemedicine application and verified that I am speaking with the correct person using two identifiers.  Location: Patient: Virtual Visit Location  Patient: Home Provider: Virtual Visit Location Provider: Home Office   I discussed the limitations of evaluation and management by telemedicine and the availability of in person appointments. The patient expressed understanding and agreed to proceed.    History of Present Illness: Lindsey [name] is a 36 y.o. who identifies as a female who was assigned female at birth, and is being seen today for sore throat and headache.  States that husband was diagnosed with strep 2 days ago.  States that he daughter had it before that.  Denies pregnancy or breastfeeding.  Denies any other symptoms.  HPI: HPI  Problems:  Patient Active Problem List   Diagnosis Date Noted   Hyperlipidemia associated with type 2 diabetes mellitus (HCC) 08/12/2021   Well woman exam with routine gynecological exam 07/18/2020   Hypothyroidism, adult 08/30/2019   Acne 10/21/2017   Insulin pump status 08/17/2012   Hypoglycemia associated with diabetes (HCC) 08/17/2012   Diabetes mellitus type I (HCC) 08/27/2011    Allergies: No Known Allergies Medications:  Current Outpatient Medications:    atorvastatin (LIPITOR) 40 MG tablet, Take 1 tablet (40 mg total) by mouth daily., Disp: 90 tablet, Rfl: 1   cetirizine (ZYRTEC) 10 MG tablet, Take 10 mg by mouth daily., Disp: , Rfl:    Cholecalciferol (VITAMIN D-3) 125 MCG (5000 UT) TABS, Take 3 tablets by mouth daily., Disp: , Rfl:    Continuous Blood Gluc Sensor (FREESTYLE LIBRE 3 SENSOR) MISC, Place 1 sensor on the skin every 14 days as directed. Use  to check glucose continuously, Disp: 2 each, Rfl: 2   glucagon (GLUCAGON EMERGENCY) 1 MG injection, Inject 1 mg into the muscle once as needed for up to 1 dose., Disp: 1 each, Rfl: 12   insulin lispro (HUMALOG) 100 UNIT/ML injection, Use up to 70 units per day as directed in insulin pump, Disp: 60 mL, Rfl: 2   NP THYROID 60 MG tablet, TAKE 2 TABLETS BY MOUTH ONCE A DAY, Disp: 180 tablet, Rfl: 2   spironolactone (ALDACTONE) 100 MG  tablet, TAKE 1 TABLET BY MOUTH ONCE A DAY, Disp: 90 tablet, Rfl: 1  Observations/Objective: Patient is well-developed, well-nourished in no acute distress.  Resting comfortably at home.  Head is normocephalic, atraumatic.  No labored breathing.  Speech is clear and coherent with logical content.  Patient is alert and oriented at baseline.    Assessment and Plan: 1. Strep throat exposure  2. Pharyngitis, unspecified etiology  - Cefdinir per request.  Follow Up Instructions: I discussed the assessment and treatment plan with the patient. The patient was provided an opportunity to ask questions and all were answered. The patient agreed with the plan and demonstrated an understanding of the instructions.  A copy of instructions were sent to the patient via MyChart unless otherwise noted below.     The patient was advised to call back or seek an in-person evaluation if the symptoms worsen or if the condition fails to improve as anticipated.  Time:  I spent 12 minutes with the patient via telehealth technology discussing the above problems/concerns.    Roxy Horseman, PA-C

## 2022-05-07 NOTE — Telephone Encounter (Signed)
Pt called wanting to know if it was possible for MD to prescribe antibiotics for her sore throat   Last OV:  CPE on 04/20/22  [name] Phone:  [phone]  Fax:  [phone]

## 2022-05-17 ENCOUNTER — Other Ambulatory Visit (HOSPITAL_COMMUNITY): Payer: Self-pay

## 2022-05-17 ENCOUNTER — Other Ambulatory Visit: Payer: Self-pay | Admitting: Internal Medicine

## 2022-05-17 DIAGNOSIS — L709 Acne, unspecified: Secondary | ICD-10-CM

## 2022-05-17 MED ORDER — SPIRONOLACTONE 100 MG PO TABS
ORAL_TABLET | Freq: Every day | ORAL | 1 refills | Status: DC
  Filled 2022-05-17: qty 90, 90d supply, fill #0
  Filled 2022-08-16: qty 90, 90d supply, fill #1

## 2022-06-08 LAB — HM DIABETES EYE EXAM

## 2022-06-11 ENCOUNTER — Other Ambulatory Visit (HOSPITAL_COMMUNITY): Payer: Self-pay

## 2022-06-28 ENCOUNTER — Encounter: Payer: Self-pay | Admitting: Adult Health

## 2022-06-28 ENCOUNTER — Ambulatory Visit (INDEPENDENT_AMBULATORY_CARE_PROVIDER_SITE_OTHER): Payer: No Typology Code available for payment source | Admitting: Adult Health

## 2022-06-28 ENCOUNTER — Other Ambulatory Visit: Payer: Self-pay | Admitting: Internal Medicine

## 2022-06-28 VITALS — BP 116/78 | HR 81 | Ht 64.0 in | Wt 182.5 lb

## 2022-06-28 DIAGNOSIS — E039 Hypothyroidism, unspecified: Secondary | ICD-10-CM

## 2022-06-28 DIAGNOSIS — Z3202 Encounter for pregnancy test, result negative: Secondary | ICD-10-CM | POA: Diagnosis not present

## 2022-06-28 DIAGNOSIS — R102 Pelvic and perineal pain: Secondary | ICD-10-CM

## 2022-06-28 DIAGNOSIS — N926 Irregular menstruation, unspecified: Secondary | ICD-10-CM

## 2022-06-28 DIAGNOSIS — N941 Unspecified dyspareunia: Secondary | ICD-10-CM

## 2022-06-28 LAB — POCT URINE PREGNANCY: Preg Test, Ur: NEGATIVE

## 2022-06-28 NOTE — Progress Notes (Signed)
  Subjective:     Patient ID: Lindsey [name], female   DOB: 10/11/1986, 36 y.o.   MRN: [id number]  HPI Lindsey [name] is a 36 year old white female, married, G2P2 in complaining of pelvic pain, pain in left hip area last Friday , was moving equipment around, and has had irregualr periods in June and July. Also has pain with sex, like he hits something, then fine.  Last pap 07/18/20 was negative HPV and malignancy.  PCP is Dr Deniece Ree.  Review of Systems +Pelvic pain, and in left hip area +Irregular periods +Pain with sex. Like he hits something Did notice discharge the weekend  Reviewed past medical,surgical, social and family history. Reviewed medications and allergies.     Objective:   Physical Exam BP 116/78 (BP Location: Left Arm, Patient Position: Sitting, Cuff Size: Normal)   Pulse 81   Ht 5\' 4"  (1.626 m)   Wt 182 lb 8 oz (82.8 kg)   LMP 06/04/2022   BMI 31.33 kg/m  UPT is negative. Skin warm and dry.Pelvic: external genitalia is normal in appearance no lesions, vagina:pink and moist,urethra has no lesions or masses noted, cervix:smooth and bulbous, uterus: normal size, shape and contour, non tender, no masses felt, adnexa: no masses or tenderness noted. Bladder is non tender and no masses felt.   [facility] - 06/28/22 1002       Pregnancy Intention Screening   Does the patient want to become pregnant in the next year? No    Does the patient's partner want to become pregnant in the next year? No    Would the patient like to discuss contraceptive options today? No      Contraception Wrap Up   Current Method Vasectomy    End Method Vasectomy            Examination chaperoned by Levy Pupa LPN     Assessment:     1. Pregnancy examination or test, negative result - POCT urine pregnancy  2. Pelvic pain +pain last week in left hip area, was really hurting Friday  [place] scheduled  06/29/22 at 3 pm at [facility] to assess uterus and ovaries,could have had a cyst  - US PELVIC  COMPLETE WITH TRANSVAGINAL; Future  3. Dyspareunia, female Has pain with sex, like her is hitting something - US PELVIC COMPLETE WITH TRANSVAGINAL; Future  4. Irregular periods Periods irregular in June and July  - US PELVIC COMPLETE WITH TRANSVAGINAL; Future     Plan:     Pap and physical in 1 year

## 2022-06-29 ENCOUNTER — Ambulatory Visit (HOSPITAL_BASED_OUTPATIENT_CLINIC_OR_DEPARTMENT_OTHER)
Admission: RE | Admit: 2022-06-29 | Discharge: 2022-06-29 | Disposition: A | Discharge status: Home / Self Care | Payer: No Typology Code available for payment source | Source: Ambulatory Visit | Attending: Adult Health | Admitting: Adult Health

## 2022-06-29 DIAGNOSIS — N926 Irregular menstruation, unspecified: Secondary | ICD-10-CM | POA: Insufficient documentation

## 2022-06-29 DIAGNOSIS — R102 Pelvic and perineal pain: Secondary | ICD-10-CM | POA: Diagnosis not present

## 2022-06-29 DIAGNOSIS — N941 Unspecified dyspareunia: Secondary | ICD-10-CM | POA: Diagnosis present

## 2022-06-30 ENCOUNTER — Other Ambulatory Visit (HOSPITAL_COMMUNITY): Payer: Self-pay

## 2022-06-30 ENCOUNTER — Telehealth: Payer: Self-pay | Admitting: Adult Health

## 2022-06-30 DIAGNOSIS — N83292 Other ovarian cyst, left side: Secondary | ICD-10-CM

## 2022-06-30 MED ORDER — NP THYROID 60 MG PO TABS
120.0000 mg | ORAL_TABLET | Freq: Every day | ORAL | 2 refills | Status: DC
  Filled 2022-06-30: qty 180, 90d supply, fill #0
  Filled 2022-10-21: qty 180, 90d supply, fill #1
  Filled 2023-01-18: qty 180, 90d supply, fill #2

## 2022-06-30 NOTE — Telephone Encounter (Signed)
Pt aware of [place], and 6 cm left ovarian cyst, will repeat US in about 8-10 weeks in office.

## 2022-07-23 ENCOUNTER — Other Ambulatory Visit (HOSPITAL_COMMUNITY): Payer: Self-pay

## 2022-07-23 ENCOUNTER — Encounter: Payer: Self-pay | Admitting: Internal Medicine

## 2022-07-23 ENCOUNTER — Other Ambulatory Visit: Payer: Self-pay | Admitting: Internal Medicine

## 2022-07-23 DIAGNOSIS — E109 Type 1 diabetes mellitus without complications: Secondary | ICD-10-CM

## 2022-07-23 MED ORDER — FREESTYLE LIBRE 3 SENSOR MISC
1.0000 | 2 refills | Status: DC
  Filled 2022-07-23: qty 2, 28d supply, fill #0
  Filled 2022-08-16: qty 2, 28d supply, fill #1
  Filled 2022-09-12: qty 2, 28d supply, fill #2

## 2022-07-26 ENCOUNTER — Other Ambulatory Visit (HOSPITAL_COMMUNITY): Payer: Self-pay

## 2022-07-30 ENCOUNTER — Other Ambulatory Visit: Payer: Self-pay

## 2022-07-30 ENCOUNTER — Encounter (HOSPITAL_COMMUNITY): Payer: Self-pay

## 2022-07-30 ENCOUNTER — Emergency Department (HOSPITAL_COMMUNITY)
Admission: EM | Admit: 2022-07-30 | Discharge: 2022-07-30 | Disposition: A | Discharge status: Home / Self Care | Payer: No Typology Code available for payment source | Attending: Emergency Medicine | Admitting: Emergency Medicine

## 2022-07-30 DIAGNOSIS — E039 Hypothyroidism, unspecified: Secondary | ICD-10-CM | POA: Insufficient documentation

## 2022-07-30 DIAGNOSIS — R197 Diarrhea, unspecified: Secondary | ICD-10-CM | POA: Diagnosis not present

## 2022-07-30 DIAGNOSIS — E109 Type 1 diabetes mellitus without complications: Secondary | ICD-10-CM | POA: Insufficient documentation

## 2022-07-30 DIAGNOSIS — R112 Nausea with vomiting, unspecified: Secondary | ICD-10-CM | POA: Diagnosis not present

## 2022-07-30 LAB — CBC WITH DIFFERENTIAL/PLATELET
Abs Immature Granulocytes: 0.02 10*3/uL (ref 0.00–0.07)
Basophils Absolute: 0 10*3/uL (ref 0.0–0.1)
Basophils Relative: 1 %
Eosinophils Absolute: 0.1 10*3/uL (ref 0.0–0.5)
Eosinophils Relative: 1 %
HCT: 40.3 % (ref 36.0–46.0)
Hemoglobin: 13.9 g/dL (ref 12.0–15.0)
Immature Granulocytes: 0 %
Lymphocytes Relative: 23 %
Lymphs Abs: 1.8 10*3/uL (ref 0.7–4.0)
MCH: 29.8 pg (ref 26.0–34.0)
MCHC: 34.5 g/dL (ref 30.0–36.0)
MCV: 86.5 fL (ref 80.0–100.0)
Monocytes Absolute: 0.6 10*3/uL (ref 0.1–1.0)
Monocytes Relative: 7 %
Neutro Abs: 5.4 10*3/uL (ref 1.7–7.7)
Neutrophils Relative %: 68 %
Platelets: 361 10*3/uL (ref 150–400)
RBC: 4.66 MIL/uL (ref 3.87–5.11)
RDW: 11.9 % (ref 11.5–15.5)
WBC: 7.9 10*3/uL (ref 4.0–10.5)
nRBC: 0 % (ref 0.0–0.2)

## 2022-07-30 LAB — BASIC METABOLIC PANEL
Anion gap: 8 (ref 5–15)
BUN: 13 mg/dL (ref 6–20)
CO2: 27 mmol/L (ref 22–32)
Calcium: 9.4 mg/dL (ref 8.9–10.3)
Chloride: 105 mmol/L (ref 98–111)
Creatinine, Ser: 0.83 mg/dL (ref 0.44–1.00)
GFR, Estimated: >60 mL/min (ref >60)
Glucose, Bld: 88 mg/dL (ref 70–99)
Potassium: 3.4 mmol/L — ABNORMAL LOW (ref 3.5–5.1)
Sodium: 140 mmol/L (ref 135–145)

## 2022-07-30 LAB — LIPASE, BLOOD: Lipase: 29 U/L (ref 11–51)

## 2022-07-30 MED ORDER — ONDANSETRON 4 MG PO TBDP
4.0000 mg | ORAL_TABLET | Freq: Once | ORAL | Status: AC
  Administered 2022-07-30: 4 mg via ORAL
  Filled 2022-07-30: qty 1

## 2022-07-30 MED ORDER — ONDANSETRON HCL 4 MG PO TABS: 4.0000 mg | ORAL_TABLET | Freq: Four times a day (QID) | ORAL | 0 refills | Status: DC

## 2022-07-30 MED ORDER — POTASSIUM CHLORIDE CRYS ER 20 MEQ PO TBCR
40.0000 meq | EXTENDED_RELEASE_TABLET | Freq: Once | ORAL | Status: AC
  Administered 2022-07-30: 40 meq via ORAL
  Filled 2022-07-30: qty 2

## 2022-07-30 MED ORDER — ALUM & MAG HYDROXIDE-SIMETH 200-200-20 MG/5ML PO SUSP
30.0000 mL | Freq: Once | ORAL | Status: AC
  Administered 2022-07-30: 30 mL via ORAL
  Filled 2022-07-30: qty 30

## 2022-07-30 NOTE — ED Notes (Signed)
Pt reports trying TUMS and Pepto Bismul at home PTA w/o relief of symptoms. Pt reports pain is constant.

## 2022-07-30 NOTE — ED Notes (Signed)
Pt d/c home per MD order. Discharge summary reviewed with pt, pt verbalizes understanding. Ambulatory off unit. D/C home with spouse.

## 2022-07-30 NOTE — ED Triage Notes (Signed)
Pt arrived via POV c/o epigastric pain since yesterday. Pt reports nausea and 1 episode of emesis, accompanied with one episode of Diarrhea today. Denies any blood in stool or emesis.

## 2022-07-30 NOTE — Discharge Instructions (Addendum)
We evaluated you for your nausea, vomiting, diarrhea, and abdominal pain.  Your laboratory testing was overall very reassuring.  Your exam was also reassuring.  Your symptoms are likely due to a foodborne or viral illness, and should improve on their own.  I have prescribed you a medication called Zofran for nausea, which she can take every 6 hours as needed for nausea and vomiting.  Please try to drink a lot of fluids.  For your diarrhea, you can take medication called Imodium, you can take 2 mg 4 times a day as needed for diarrhea, you can buy this medication over-the-counter.  You can also try a medication called Mylanta for your abdominal pain, which can help with this kind of abdominal pain.  Please return to the emergency department if your symptoms worsen, you are unable to tolerate fluids by mouth, you develop shortness of breath or significant hyperglycemia, you develop blood in your stool or blood in your vomit, or your abdominal pain becomes worse.

## 2022-07-31 NOTE — ED Provider Notes (Signed)
[facility] EMERGENCY DEPARTMENT Provider Note  CSN: [id number] Arrival date & time: 07/30/22 2010  Chief Complaint(s) Abdominal Pain  HPI ADYLAN MOAD is a 36 y.o. female with history of type 1 diabetes presenting to the emergency department with nausea, vomiting diarrhea and abdominal pain.  Patient reports epigastric abdominal pain began yesterday, is a pressure.  Reports some nausea and vomiting, no hematemesis.  Also reports diarrhea.  Symptoms are mild.  Tried Tums and Pepto-Bismol without significant relief.  Has insulin pump, functioning normally.  No chest pain, shortness of breath, fevers or chills, dysuria.  No flank pain.   Past Medical History Past Medical History:  Diagnosis Date   Diabetes mellitus    Diabetes mellitus affecting pregnancy in first trimester 03/16/2016   Hypothyroidism, adult 08/30/2019   Pregnant 03/16/2016   Supervision of other high-risk pregnancy 03/31/2016    [facility] at   8+5 weeks FOB  Marlou Porch 36 yo wm second child Dating By  LMP and [place] Pap  03/31/16 GC/CT Initial:                36+wks: Genetic Screen NT/IT:  CF screen  Anatomic [place]  Flu vaccine  Tdap Recommended ~ 28wks Glucose Screen  2 hr GBS  Feed Preference  Contraception  Circumcision  Childbirth Classes  Pediatrician     Patient Active Problem List   Diagnosis Date Noted   Pelvic pain 06/28/2022   Pregnancy examination or test, negative result 06/28/2022   Irregular periods 06/28/2022   Dyspareunia, female 06/28/2022   Hyperlipidemia associated with type 2 diabetes mellitus ([place]) 08/12/2021   Well woman exam with routine gynecological exam 07/18/2020   Hypothyroidism, adult 08/30/2019   Acne 10/21/2017   Insulin pump status 08/17/2012   Hypoglycemia associated with diabetes ([place]) 08/17/2012   Diabetes mellitus type I ([place]) 08/27/2011   Home Medication(s) Prior to Admission medications   Medication Sig Start Date End Date Taking? Authorizing Provider   atorvastatin (LIPITOR) 40 MG tablet Take 1 tablet (40 mg total) by mouth daily. 04/29/22  Yes Isaac Bliss, Rayford Halsted, MD  cetirizine (ZYRTEC) 10 MG tablet Take 10 mg by mouth daily.   Yes [provider]  Continuous Blood Gluc Sensor (FREESTYLE LIBRE 3 SENSOR) MISC Place 1 sensor on the skin every 14 days as directed. Use to check glucose continuously 07/23/22  Yes Isaac Bliss, Rayford Halsted, MD  insulin lispro (HUMALOG) 100 UNIT/ML injection Use up to 70 units per day as directed in insulin pump 02/25/22  Yes Isaac Bliss, Rayford Halsted, MD  NP THYROID 60 MG tablet TAKE 2 TABLETS BY MOUTH ONCE A DAY 06/30/22 06/30/23 Yes Erline Hau, MD  ondansetron (ZOFRAN) 4 MG tablet Take 1 tablet (4 mg total) by mouth every 6 (six) hours. 07/30/22  Yes Cristie Hem, MD  spironolactone (ALDACTONE) 100 MG tablet TAKE 1 TABLET BY MOUTH ONCE A DAY 05/17/22 05/17/23 Yes Erline Hau, MD  glucagon (GLUCAGON EMERGENCY) 1 MG injection Inject 1 mg into the muscle once as needed for up to 1 dose. 10/21/17   Philemon Kingdom, MD  Past Surgical History Past Surgical History:  Procedure Laterality Date   CESAREAN SECTION N/A 10/29/2016   Procedure: PRIMARY CESAREAN SECTION;  Surgeon: Florian Buff, MD;  Location: [place];  Service: Obstetrics;  Laterality: N/A;   NO PAST SURGERIES     Family History Family History  Problem Relation Age of Onset   Hypertension Father    Cancer Maternal Grandmother    Cancer Maternal Grandfather    Alzheimer's disease Paternal Grandfather     Social History Social History   Tobacco Use   Smoking status: Never   Smokeless tobacco: Never  Vaping Use   Vaping Use: Never used  Substance Use Topics   Alcohol use: Yes    Alcohol/week: 2.0 standard drinks of alcohol    Types: 1 Glasses of wine, 1 Shots  of liquor per week    Comment: occ   Drug use: No   Allergies Patient has no known allergies.  Review of Systems Review of Systems  All other systems reviewed and are negative.   Physical Exam Vital Signs  I have reviewed the triage vital signs BP 121/89   Pulse 70   Temp 98 F (36.7 C) (Oral)   Resp 16   Ht 5\' 4"  (1.626 m)   Wt 80 kg   LMP 07/23/2022 (Approximate)   SpO2 97%   BMI 30.27 kg/m  Physical Exam Vitals and nursing note reviewed.  Constitutional:      General: She is not in acute distress.    Appearance: She is well-developed.  HENT:     Head: Normocephalic and atraumatic.     Mouth/Throat:     Mouth: Mucous membranes are moist.  Eyes:     Pupils: Pupils are equal, round, and reactive to light.  Cardiovascular:     Rate and Rhythm: Normal rate and regular rhythm.     Heart sounds: No murmur heard. Pulmonary:     Effort: Pulmonary effort is normal. No respiratory distress.     Breath sounds: Normal breath sounds.  Abdominal:     General: Abdomen is flat.     Palpations: Abdomen is soft.     Tenderness: There is no abdominal tenderness.  Musculoskeletal:        General: No tenderness.     Right lower leg: No edema.     Left lower leg: No edema.  Skin:    General: Skin is warm and dry.  Neurological:     General: No focal deficit present.     Mental Status: She is alert. Mental status is at baseline.  Psychiatric:        Mood and Affect: Mood normal.        Behavior: Behavior normal.     ED Results and Treatments Labs (all labs ordered are listed, but only abnormal results are displayed) Labs Reviewed  BASIC METABOLIC PANEL - Abnormal; Notable for the following components:      Result Value   Potassium 3.4 (*)    All other components within normal limits  CBC WITH DIFFERENTIAL/PLATELET  LIPASE, BLOOD  Radiology No  results found.  Pertinent labs & imaging results that were available during my care of the patient were reviewed by me and considered in my medical decision making (see MDM for details).  Medications Ordered in ED Medications  alum & mag hydroxide-simeth (MAALOX/MYLANTA) 200-200-20 MG/5ML suspension 30 mL (30 mLs Oral Given 07/30/22 2231)  ondansetron (ZOFRAN-ODT) disintegrating tablet 4 mg (4 mg Oral Given 07/30/22 2231)  potassium chloride SA (KLOR-CON M) CR tablet 40 mEq (40 mEq Oral Given 07/30/22 2231)                                                                                                                                     Procedures Procedures  (including critical care time)  Medical Decision Making / ED Course   MDM:  36 year old female presenting to the emergency department with abdominal pain.  Differential includes gastritis, gastroenteritis, gastroparesis, pancreatitis, cholecystitis, perforation, volvulus or obstruction.  Physical exam reassuring with no abdominal tenderness.  Vital signs reassuring.  Suspect most likely causes gastritis or gastroenteritis.  She reports that her mother had similar symptoms after they ate the same meal, mother symptoms have improved however.  Very low concern for pancreatitis with normal lipase and no tenderness.  Doubt cholecystitis with no tenderness, fevers or chills.  Doubt perforation with no peritoneal signs and mild symptoms.  Doubt volvulus tolerate p.o., doubt obstruction, still having bowel movements.  Glucose reassuring, no evidence of diabetic complication.  Could also represent gastroparesis but patient has well-controlled glucose, it is less likely.  Given very reassuring exam, reassuring work-up, discussed with patient low concern for dangerous process.  Will prescribe Zofran.  Advise follow-up with primary physician. Will discharge patient to home. All questions answered. Patient comfortable with plan of discharge.  Return precautions discussed with patient and specified on the after visit summary.       Additional history obtained: -Additional history obtained from spouse -External records from outside source obtained and reviewed including: Chart review including previous notes, labs, imaging, consultation notes including office visit 06/28/21   Lab Tests: -I ordered, reviewed, and interpreted labs.   The pertinent results include:   Labs Reviewed  BASIC METABOLIC PANEL - Abnormal; Notable for the following components:      Result Value   Potassium 3.4 (*)    All other components within normal limits  CBC WITH DIFFERENTIAL/PLATELET  LIPASE, BLOOD    Notable for mild hypokalemia   Medicines ordered and prescription drug management: Meds ordered this encounter  Medications   ondansetron (ZOFRAN) 4 MG tablet    Sig: Take 1 tablet (4 mg total) by mouth every 6 (six) hours.    Dispense:  12 tablet    Refill:  0   alum & mag hydroxide-simeth (MAALOX/MYLANTA) 200-200-20 MG/5ML suspension 30 mL   ondansetron (ZOFRAN-ODT) disintegrating tablet 4 mg   potassium chloride SA (KLOR-CON M) CR tablet 40 mEq    -  I have reviewed the patients home medicines and have made adjustments as needed   Reevaluation: After the interventions noted above, I reevaluated the patient and found that they have improved  Co morbidities that complicate the patient evaluation  Past Medical History:  Diagnosis Date   Diabetes mellitus    Diabetes mellitus affecting pregnancy in first trimester 03/16/2016   Hypothyroidism, adult 08/30/2019   Pregnant 03/16/2016   Supervision of other high-risk pregnancy 03/31/2016    [facility] at   8+5 weeks FOB  Marlou Porch 36 yo wm second child Dating By  LMP and [place] Pap  03/31/16 GC/CT Initial:                36+wks: Genetic Screen NT/IT:  CF screen  Anatomic [place]  Flu vaccine  Tdap Recommended ~ 28wks Glucose Screen  2 hr GBS  Feed Preference  Contraception   Circumcision  Childbirth Classes  Pediatrician        Dispostion: Disposition decision including need for hospitalization was considered, and patient discharged from emergency department.    Final Clinical Impression(s) / ED Diagnoses Final diagnoses:  Nausea vomiting and diarrhea     This chart was dictated using voice recognition software.  Despite best efforts to proofread,  errors can occur which can change the documentation meaning.    Cristie Hem, MD 07/31/22 [phone]

## 2022-08-05 ENCOUNTER — Ambulatory Visit (INDEPENDENT_AMBULATORY_CARE_PROVIDER_SITE_OTHER): Payer: No Typology Code available for payment source | Admitting: General Surgery

## 2022-08-05 ENCOUNTER — Ambulatory Visit (HOSPITAL_COMMUNITY)
Admission: RE | Admit: 2022-08-05 | Discharge: 2022-08-05 | Disposition: A | Discharge status: Home / Self Care | Payer: No Typology Code available for payment source | Source: Ambulatory Visit | Attending: General Surgery | Admitting: General Surgery

## 2022-08-05 ENCOUNTER — Encounter: Payer: Self-pay | Admitting: General Surgery

## 2022-08-05 ENCOUNTER — Encounter: Payer: Self-pay | Admitting: Internal Medicine

## 2022-08-05 ENCOUNTER — Telehealth: Payer: Self-pay | Admitting: *Deleted

## 2022-08-05 ENCOUNTER — Other Ambulatory Visit (HOSPITAL_COMMUNITY)
Admission: RE | Admit: 2022-08-05 | Discharge: 2022-08-05 | Disposition: A | Discharge status: Home / Self Care | Payer: No Typology Code available for payment source | Source: Ambulatory Visit | Attending: General Surgery | Admitting: General Surgery

## 2022-08-05 VITALS — BP 118/86 | HR 81 | Temp 97.9°F | Resp 14 | Ht 64.0 in | Wt 173.0 lb

## 2022-08-05 DIAGNOSIS — R112 Nausea with vomiting, unspecified: Secondary | ICD-10-CM | POA: Insufficient documentation

## 2022-08-05 DIAGNOSIS — R1013 Epigastric pain: Secondary | ICD-10-CM

## 2022-08-05 LAB — HEPATIC FUNCTION PANEL
ALT: 15 U/L (ref 0–44)
AST: 12 U/L — ABNORMAL LOW (ref 15–41)
Albumin: 4.4 g/dL (ref 3.5–5.0)
Alkaline Phosphatase: 51 U/L (ref 38–126)
Bilirubin, Direct: 0.1 mg/dL (ref 0.0–0.2)
Indirect Bilirubin: 0.5 mg/dL (ref 0.3–0.9)
Total Bilirubin: 0.6 mg/dL (ref 0.3–1.2)
Total Protein: 7.7 g/dL (ref 6.5–8.1)

## 2022-08-05 LAB — LIPASE, BLOOD: Lipase: 30 U/L (ref 11–51)

## 2022-08-05 MED ORDER — SUCRALFATE 1 GM/10ML PO SUSP: 1.0000 g | Freq: Three times a day (TID) | ORAL | 0 refills | Status: DC

## 2022-08-05 MED ORDER — OMEPRAZOLE MAGNESIUM 20 MG PO TBEC: 40.0000 mg | DELAYED_RELEASE_TABLET | Freq: Every day | ORAL | 0 refills | Status: DC

## 2022-08-05 MED ORDER — PROMETHAZINE HCL 12.5 MG PO TABS: 12.5000 mg | ORAL_TABLET | Freq: Four times a day (QID) | ORAL | 0 refills | Status: DC | PRN

## 2022-08-05 NOTE — Telephone Encounter (Signed)
Call placed to patient to discuss lab results.   Blood work and [place] noted WNL. Discussed with Dr. Constance Haw whose recommendations are as follows: Continue Omeprazole, Carafate and Phenergan as directed.  Possible GI upset like gastritis.  Push fluids.   Phone F/U in 2 weeks.   Call placed to patient. [place]. Also sent MyChart message.

## 2022-08-05 NOTE — Patient Instructions (Addendum)
Take the omeprazole and carafate pending any gastritis. Will get an Ultrasound and order liver test and lipase to look at the liver / bile system/ gallbladder and pancreas. Will call with results. Phenergan for nausea since the zofran is not helping.   Will give you information on gastritis and gallstones for now. Given the possibility of both.

## 2022-08-05 NOTE — Progress Notes (Signed)
[facility] History and Physical  Reason for Referral: Epigastric pain Referring Physician: Self   Chief Complaint   New Patient (Initial Visit)     Lindsey [name] is a 36 y.o. female.  HPI: Ms. [place] is a 36 yo who has diabetes that has never had any issues with any chronic nausea or vomiting or anything remotely concerning for gastroparesis but she had sudden onset of abdominal pain in the epigastric region and nausea and vomiting that was sudden in onset after a greasy meal. She went to the ED and a CBC was done but no new LFTs. She was presumed to have some gastritis or possibly gastroparesis.   She comes to see me because after 1 week she still has some nausea and epigastric tenderness. She is worried that this is something else.   Past Medical History:  Diagnosis Date   Diabetes mellitus    Diabetes mellitus affecting pregnancy in first trimester 03/16/2016   Hypothyroidism, adult 08/30/2019   Pregnant 03/16/2016   Supervision of other high-risk pregnancy 03/31/2016    [facility] at   8+5 weeks FOB  Cloee Honegger 36 yo wm second child Dating By  LMP and [place] Pap  03/31/16 GC/CT Initial:                36+wks: Genetic Screen NT/IT:  CF screen  Anatomic [place]  Flu vaccine  Tdap Recommended ~ 28wks Glucose Screen  2 hr GBS  Feed Preference  Contraception  Circumcision  Childbirth Classes  Pediatrician      Past Surgical History:  Procedure Laterality Date   CESAREAN SECTION N/A 10/29/2016   Procedure: PRIMARY CESAREAN SECTION;  Surgeon: Lazaro Arms, MD;  [facility];  Service: Obstetrics;  Laterality: N/A;   NO PAST SURGERIES      Family History  Problem Relation Age of Onset   Hypertension Father    Cancer Maternal Grandmother    Cancer Maternal Grandfather    Alzheimer's disease Paternal Grandfather     Social History   Tobacco Use   Smoking status: Never   Smokeless tobacco: Never  Vaping Use   Vaping Use: Never  used  Substance Use Topics   Alcohol use: Yes    Alcohol/week: 2.0 standard drinks of alcohol    Types: 1 Glasses of wine, 1 Shots of liquor per week    Comment: occ   Drug use: No    Medications: I have reviewed the patient's current medications. Allergies as of 08/05/2022   No Known Allergies      Medication List        Accurate as of August 05, 2022  1:18 PM. If you have any questions, ask your nurse or doctor.          STOP taking these medications    ondansetron 4 MG tablet Commonly known as: ZOFRAN Stopped by: Lucretia Roers, MD       TAKE these medications    atorvastatin 40 MG tablet Commonly known as: LIPITOR Take 1 tablet (40 mg total) by mouth daily.   cetirizine 10 MG tablet Commonly known as: ZYRTEC Take 10 mg by mouth daily.   FreeStyle Libre 3 Sensor Misc Place 1 sensor on the skin every 14 days as directed. Use to check glucose continuously   glucagon 1 MG injection Inject 1 mg into the muscle once as needed for up to 1 dose.   HumaLOG 100 UNIT/ML injection Generic drug: insulin  lispro Use up to 70 units per day as directed in insulin pump   NP Thyroid 60 MG tablet Generic drug: thyroid TAKE 2 TABLETS BY MOUTH ONCE A DAY   spironolactone 100 MG tablet Commonly known as: ALDACTONE TAKE 1 TABLET BY MOUTH ONCE A DAY         ROS:  A comprehensive review of systems was negative except for: Gastrointestinal: positive for abdominal pain, nausea, reflux symptoms, and vomiting Neurological: positive for headaches Endocrine: positive for tired sluggish/ diabetes  Blood pressure 118/86, pulse 81, temperature 97.9 F (36.6 C), temperature source Oral, resp. rate 14, height 5\' 4"  (1.626 m), weight 173 lb (78.5 kg), last menstrual period 07/23/2022, SpO2 98 %. Physical Exam Vitals reviewed.  Constitutional:      Appearance: Normal appearance.  HENT:     Head: Normocephalic.     Nose: Nose normal.  Eyes:     Extraocular Movements:  Extraocular movements intact.  Cardiovascular:     Rate and Rhythm: Normal rate and regular rhythm.  Pulmonary:     Effort: Pulmonary effort is normal.     Breath sounds: Normal breath sounds.  Abdominal:     General: There is no distension.     Palpations: Abdomen is soft.     Tenderness: There is abdominal tenderness.     Comments: Minor epigastric tenderness  Musculoskeletal:        General: Normal range of motion.     Cervical back: Normal range of motion.  Skin:    General: Skin is warm.  Neurological:     General: No focal deficit present.     Mental Status: She is alert and oriented to person, place, and time.  Psychiatric:        Mood and Affect: Mood normal.        Thought Content: Thought content normal.        Judgment: Judgment normal.     Results: Ordered LFTs and [place] today-    Latest Reference Range & Units 08/05/22 14:26  Alkaline Phosphatase 38 - 126 U/L 51  Albumin 3.5 - 5.0 g/dL 4.4  Lipase 11 - 51 U/L 30  AST 15 - 41 U/L 12 (L)  ALT 0 - 44 U/L 15  Total Protein 6.5 - 8.1 g/dL 7.7  Bilirubin, Direct 0.0 - 0.2 mg/dL 0.1  Indirect Bilirubin 0.3 - 0.9 mg/dL 0.5  Total Bilirubin 0.3 - 1.2 mg/dL 0.6  (L): Data is abnormally low  CLINICAL DATA:  Epigastric pain, nausea, vomiting   EXAM: ULTRASOUND ABDOMEN LIMITED RIGHT UPPER QUADRANT   COMPARISON:  None Available.   FINDINGS: Gallbladder:   No gallstones or wall thickening visualized. No sonographic Murphy sign noted by sonographer.   Common bile duct:   Diameter: 3.1 mm   Liver:   No focal lesion identified. Within normal limits in parenchymal echogenicity. Portal vein is patent on color Doppler imaging with normal direction of blood flow towards the liver.   Other: None.   IMPRESSION: No sonographic abnormalities are seen in right upper quadrant of abdomen.     Electronically Signed   By: Elmer Picker M.D.   On: 08/05/2022 15:46   Assessment & Plan:  NZINGHA PIRAINO is a  36 y.o. female with epigastric pain and nausea and vomiting. No obvious stones on the [place] and LFTs and lipase normal. I think she probably has some gastritis versus she has some biliary dyskinesia which we would need to get an HIDA for if  she continues to have symptoms.  Take the omeprazole and carafate pending any gastritis. Will get an Ultrasound and order liver test and lipase to look at the liver / bile system/ gallbladder and pancreas. Will call with results. Phenergan for nausea since the zofran is not helping.   Will give you information on gastritis and gallstones for now. Given the possibility of both.   All questions were answered to the satisfaction of the patient.     Virl Cagey 08/05/2022, 1:18 PM

## 2022-08-10 ENCOUNTER — Other Ambulatory Visit (HOSPITAL_COMMUNITY): Payer: Self-pay

## 2022-08-16 ENCOUNTER — Other Ambulatory Visit (HOSPITAL_COMMUNITY): Payer: Self-pay

## 2022-08-17 ENCOUNTER — Encounter: Payer: Self-pay | Admitting: Internal Medicine

## 2022-08-19 ENCOUNTER — Ambulatory Visit (INDEPENDENT_AMBULATORY_CARE_PROVIDER_SITE_OTHER): Payer: No Typology Code available for payment source | Admitting: General Surgery

## 2022-08-19 DIAGNOSIS — R112 Nausea with vomiting, unspecified: Secondary | ICD-10-CM

## 2022-08-19 DIAGNOSIS — R1013 Epigastric pain: Secondary | ICD-10-CM

## 2022-08-19 NOTE — Progress Notes (Signed)
[facility]  Patient worked up for RUQ /epigastric pain. US showed no stones. LFTs were normal. She was able to get to [place].   She is feeling much better and having no issues. Not needing any medication or anything.   If she has the same symptoms again may need a HIDA scan.  Algis Greenhouse, MD [facility] [address]ella [address] [place], [place] [address] [phone] (office)

## 2022-08-30 ENCOUNTER — Other Ambulatory Visit (HOSPITAL_COMMUNITY): Payer: Self-pay

## 2022-09-01 ENCOUNTER — Encounter: Payer: Self-pay | Admitting: Adult Health

## 2022-09-01 ENCOUNTER — Ambulatory Visit (INDEPENDENT_AMBULATORY_CARE_PROVIDER_SITE_OTHER): Payer: BC Managed Care – PPO | Admitting: Adult Health

## 2022-09-01 ENCOUNTER — Other Ambulatory Visit: Payer: Self-pay | Admitting: Adult Health

## 2022-09-01 ENCOUNTER — Ambulatory Visit (INDEPENDENT_AMBULATORY_CARE_PROVIDER_SITE_OTHER): Payer: BC Managed Care – PPO

## 2022-09-01 VITALS — BP 97/64 | HR 86 | Ht 64.0 in | Wt 174.0 lb

## 2022-09-01 DIAGNOSIS — N83292 Other ovarian cyst, left side: Secondary | ICD-10-CM

## 2022-09-01 NOTE — Progress Notes (Signed)
PELVIC US TA/TV with doppler,homogeneous anteverted uterus,EEC 4.7 mm,normal right ovary,left complex unilocular cyst with acoustic streaming,? cystadenoma 6.8 x 5.5 x 3.8 cm,left ovarian arterial and venous flow visualized,ovaries appear mobile,no free fluid,left adnexal discomfort

## 2022-09-01 NOTE — Progress Notes (Signed)
  Subjective:     Patient ID: Lindsey [name], female   DOB: [birth year], 36 y.o.   MRN: [id number]  HPI Lindsey [name] is a 36 year old white female, married, G2P2 in for follow up US on left ovarina cyst. No pain now.  Last pap was negative HPV and malignancy 07/18/20.  PCP is Dr Loreta Ave  Review of Systems No pelvic pain Reviewed past medical,surgical, social and family history. Reviewed medications and allergies.     Objective:   Physical Exam BP 97/64 (BP Location: Left Arm, Patient Position: Sitting, Cuff Size: Normal)   Pulse 86   Ht 5\' 4"  (1.626 m)   Wt 174 lb (78.9 kg)   LMP 08/22/2022   BMI 29.87 kg/m     Skin warm and dry. Lungs: clear to ausculation bilaterally. Cardiovascular: regular rate and rhythm.  13/09/2022 showed 6.8 cm cyst on left, stable good blood flow.  Upstream - 09/01/22 0911       Pregnancy Intention Screening   Does the patient want to become pregnant in the next year? No    Does the patient's partner want to become pregnant in the next year? No    Would the patient like to discuss contraceptive options today? No      Contraception Wrap Up   Current Method Vasectomy    End Method Vasectomy    Contraception Counseling Provided No             Assessment:     1. Complex cyst of left ovary Stable, no pain now     Plan:     Follow up prn

## 2022-09-13 ENCOUNTER — Other Ambulatory Visit (HOSPITAL_COMMUNITY): Payer: Self-pay

## 2022-10-21 ENCOUNTER — Other Ambulatory Visit: Payer: Self-pay | Admitting: Internal Medicine

## 2022-10-21 ENCOUNTER — Other Ambulatory Visit: Payer: Self-pay

## 2022-10-21 ENCOUNTER — Other Ambulatory Visit (HOSPITAL_COMMUNITY): Payer: Self-pay

## 2022-10-21 DIAGNOSIS — E1169 Type 2 diabetes mellitus with other specified complication: Secondary | ICD-10-CM

## 2022-10-21 DIAGNOSIS — E109 Type 1 diabetes mellitus without complications: Secondary | ICD-10-CM

## 2022-10-21 MED ORDER — FREESTYLE LIBRE 3 SENSOR MISC
1.0000 | 2 refills | Status: DC
  Filled 2022-10-21: qty 2, 28d supply, fill #0
  Filled 2022-11-11: qty 2, 28d supply, fill #1
  Filled 2022-12-11: qty 2, 28d supply, fill #2

## 2022-10-21 MED ORDER — ATORVASTATIN CALCIUM 40 MG PO TABS
40.0000 mg | ORAL_TABLET | Freq: Every day | ORAL | 1 refills | Status: DC
  Filled 2022-10-21: qty 90, 90d supply, fill #0
  Filled 2023-01-18: qty 90, 90d supply, fill #1

## 2022-11-01 ENCOUNTER — Other Ambulatory Visit: Payer: Self-pay | Admitting: Internal Medicine

## 2022-11-01 ENCOUNTER — Other Ambulatory Visit: Payer: Self-pay

## 2022-11-01 MED ORDER — INSULIN LISPRO 100 UNIT/ML IJ SOLN
70.0000 [IU] | Freq: Every day | INTRAMUSCULAR | 2 refills | Status: DC
  Filled 2022-11-01 – 2022-11-11 (×2): qty 60, 85d supply, fill #0
  Filled 2023-02-04: qty 60, 85d supply, fill #1
  Filled 2023-04-30: qty 60, 85d supply, fill #2

## 2022-11-02 ENCOUNTER — Other Ambulatory Visit: Payer: Self-pay

## 2022-11-08 ENCOUNTER — Other Ambulatory Visit: Payer: Self-pay

## 2022-11-11 ENCOUNTER — Other Ambulatory Visit: Payer: Self-pay | Admitting: Internal Medicine

## 2022-11-11 ENCOUNTER — Other Ambulatory Visit: Payer: Self-pay

## 2022-11-11 ENCOUNTER — Other Ambulatory Visit (HOSPITAL_COMMUNITY): Payer: Self-pay

## 2022-11-11 DIAGNOSIS — L709 Acne, unspecified: Secondary | ICD-10-CM

## 2022-11-11 MED ORDER — SPIRONOLACTONE 100 MG PO TABS
100.0000 mg | ORAL_TABLET | Freq: Every day | ORAL | 1 refills | Status: DC
  Filled 2022-11-11: qty 90, 90d supply, fill #0
  Filled 2023-02-04: qty 90, 90d supply, fill #1

## 2022-11-12 ENCOUNTER — Other Ambulatory Visit: Payer: Self-pay

## 2022-11-12 ENCOUNTER — Other Ambulatory Visit (HOSPITAL_COMMUNITY): Payer: Self-pay

## 2022-12-07 ENCOUNTER — Encounter: Payer: Self-pay | Admitting: Internal Medicine

## 2022-12-13 ENCOUNTER — Other Ambulatory Visit: Payer: Self-pay

## 2022-12-13 DIAGNOSIS — E109 Type 1 diabetes mellitus without complications: Secondary | ICD-10-CM | POA: Diagnosis not present

## 2022-12-22 ENCOUNTER — Other Ambulatory Visit: Payer: Self-pay

## 2023-01-18 ENCOUNTER — Other Ambulatory Visit: Payer: Self-pay | Admitting: Internal Medicine

## 2023-01-18 DIAGNOSIS — E109 Type 1 diabetes mellitus without complications: Secondary | ICD-10-CM

## 2023-01-19 ENCOUNTER — Other Ambulatory Visit: Payer: Self-pay

## 2023-01-19 ENCOUNTER — Other Ambulatory Visit (HOSPITAL_COMMUNITY): Payer: Self-pay

## 2023-01-19 MED ORDER — FREESTYLE LIBRE 3 SENSOR MISC
1.0000 | 2 refills | Status: DC
  Filled 2023-01-19: qty 2, 28d supply, fill #0
  Filled 2023-02-09: qty 2, 28d supply, fill #1
  Filled 2023-03-11: qty 2, 28d supply, fill #2

## 2023-02-04 ENCOUNTER — Other Ambulatory Visit (HOSPITAL_COMMUNITY): Payer: Self-pay

## 2023-02-07 ENCOUNTER — Other Ambulatory Visit: Payer: Self-pay

## 2023-02-07 ENCOUNTER — Other Ambulatory Visit (HOSPITAL_COMMUNITY): Payer: Self-pay

## 2023-02-08 ENCOUNTER — Other Ambulatory Visit (HOSPITAL_COMMUNITY): Payer: Self-pay

## 2023-02-08 ENCOUNTER — Encounter: Payer: Self-pay | Admitting: Internal Medicine

## 2023-02-08 ENCOUNTER — Other Ambulatory Visit: Payer: Self-pay

## 2023-02-08 DIAGNOSIS — M25519 Pain in unspecified shoulder: Secondary | ICD-10-CM

## 2023-02-09 ENCOUNTER — Telehealth: Payer: Self-pay | Admitting: *Deleted

## 2023-02-09 ENCOUNTER — Other Ambulatory Visit: Payer: Self-pay

## 2023-02-09 ENCOUNTER — Other Ambulatory Visit (HOSPITAL_COMMUNITY): Payer: Self-pay

## 2023-02-09 DIAGNOSIS — M25519 Pain in unspecified shoulder: Secondary | ICD-10-CM

## 2023-02-09 NOTE — Telephone Encounter (Signed)
Referral placed.

## 2023-02-18 ENCOUNTER — Encounter (HOSPITAL_COMMUNITY): Payer: Self-pay | Admitting: Occupational Therapy

## 2023-02-18 ENCOUNTER — Ambulatory Visit (HOSPITAL_COMMUNITY): Payer: BC Managed Care – PPO | Attending: Internal Medicine | Admitting: Occupational Therapy

## 2023-02-18 ENCOUNTER — Other Ambulatory Visit: Payer: Self-pay

## 2023-02-18 DIAGNOSIS — M25511 Pain in right shoulder: Secondary | ICD-10-CM

## 2023-02-18 DIAGNOSIS — M25611 Stiffness of right shoulder, not elsewhere classified: Secondary | ICD-10-CM | POA: Diagnosis not present

## 2023-02-18 DIAGNOSIS — R29898 Other symptoms and signs involving the musculoskeletal system: Secondary | ICD-10-CM | POA: Insufficient documentation

## 2023-02-18 DIAGNOSIS — M25519 Pain in unspecified shoulder: Secondary | ICD-10-CM | POA: Insufficient documentation

## 2023-02-18 NOTE — Patient Instructions (Signed)

## 2023-02-18 NOTE — Therapy (Signed)
OUTPATIENT OCCUPATIONAL THERAPY ORTHO EVALUATION  Patient Name: Lindsey [name] MRN: [id number] DOB:04/26/1986, 37 y.o., female Today's Date: 02/18/2023  PCP: Philip Aspen, Limmie Patricia, MD REFERRING PROVIDER: Philip Aspen, Limmie Patricia, MD  END OF SESSION:  OT End of Session - 02/18/23 1349     Visit Number 1    Number of Visits 9    Date for OT Re-Evaluation 03/25/23    Authorization Type BCBS    OT Start Time 1345    OT Stop Time 1430    OT Time Calculation (min) 45 min    Activity Tolerance Patient tolerated treatment well    Behavior During Therapy [facility] for tasks assessed/performed             Past Medical History:  Diagnosis Date   Diabetes mellitus    Diabetes mellitus affecting pregnancy in first trimester 03/16/2016   Hypothyroidism, adult 08/30/2019   Pregnant 03/16/2016   Supervision of other high-risk pregnancy 03/31/2016    [facility] at   8+5 weeks FOB  Hassel Neth 37 yo wm second child Dating By  LMP and [place] Pap  03/31/16 GC/CT Initial:                36+wks: Genetic Screen NT/IT:  CF screen  Anatomic [place]  Flu vaccine  Tdap Recommended ~ 28wks Glucose Screen  2 hr GBS  Feed Preference  Contraception  Circumcision  Childbirth Classes  Pediatrician     Past Surgical History:  Procedure Laterality Date   CESAREAN SECTION N/A 10/29/2016   Procedure: PRIMARY CESAREAN SECTION;  Surgeon: Lazaro Arms, MD;  Location: [facility] BIRTHING SUITES;  Service: Obstetrics;  Laterality: N/A;   NO PAST SURGERIES     Patient Active Problem List   Diagnosis Date Noted   Complex cyst of left ovary 09/01/2022   Abdominal pain, epigastric 08/05/2022   Nausea and vomiting 08/05/2022   Pelvic pain 06/28/2022   Pregnancy examination or test, negative result 06/28/2022   Irregular periods 06/28/2022   Dyspareunia, female 06/28/2022   Hyperlipidemia associated with type 2 diabetes mellitus (HCC) 08/12/2021   Well woman exam with routine gynecological exam 07/18/2020    Hypothyroidism, adult 08/30/2019   Acne 10/21/2017   Insulin pump status 08/17/2012   Hypoglycemia associated with diabetes (HCC) 08/17/2012   Diabetes mellitus type I (HCC) 08/27/2011    ONSET DATE: Sep 2023  REFERRING DIAG: R shoulder pain  THERAPY DIAG:  Acute pain of right shoulder  Stiffness of right shoulder, not elsewhere classified  Other symptoms and signs involving the musculoskeletal system  Rationale for Evaluation and Treatment: Rehabilitation  SUBJECTIVE:   SUBJECTIVE STATEMENT: "I've probably let this go on longer than I should have." Pt accompanied by: self  PERTINENT HISTORY: No significant PMH findings. Pt has reported sporadic shoulder pain for ~3 years, which has been worsening in the past 6 months.   PRECAUTIONS: None  WEIGHT BEARING RESTRICTIONS: No  PAIN:  Are you having pain?  None at rest - 8/10 with movement  FALLS: Has patient fallen in last 6 months? No  LIVING ENVIRONMENT: Lives with: lives with their family Lives in: House/apartment  PLOF: Independent  PATIENT GOALS: To reduce pain and full range of motion  NEXT MD VISIT: none  OBJECTIVE:   HAND DOMINANCE: Right  ADLs: Overall ADLs: Pt is having to use moderate compensatory strategies for ADL's where she does not have to lift her arm to complete them. Limited in IADL's as far  as lifting.   FUNCTIONAL OUTCOME MEASURES: FOTO: 60.08/100  UPPER EXTREMITY ROM:     Active ROM Right eval  Shoulder flexion 121  Shoulder abduction 110  Shoulder internal rotation 90  Shoulder external rotation 20  (Blank rows = not tested)  UPPER EXTREMITY MMT:     MMT Right eval  Shoulder flexion 4+/5  Shoulder abduction 4/5  Shoulder adduction 4/5  Shoulder extension 4+/5  Shoulder internal rotation 4+/5  Shoulder external rotation 4+/5  (Blank rows = not tested)  SENSATION: WFL  EDEMA: no swelling noted  OBSERVATIONS: Moderate fascial restrictions noted in anterior shoulder  girdle and subscapularis   TODAY'S TREATMENT:                                                                                                                              DATE: 02/18/23: Evaluation Only    PATIENT EDUCATION: Education details: Table Slides and Wall Slides Person educated: Patient Education method: Explanation, Demonstration, and Handouts Education comprehension: verbalized understanding and returned demonstration  HOME EXERCISE PROGRAM: 5/10: Table Slides and Wall Slides  GOALS: Goals reviewed with patient? Yes  SHORT TERM GOALS: Target date: 03/25/23  Pt will be provided and educated on an HEP for improved RUE mobility and strength for ADL completion.   Goal status: INITIAL  2.  Pt will decrease RUE pain to 3/10 in order to sleep 3+ consecutive hours without waking due to pain.   Goal status: INITIAL  3.  Pt will decrease fascial restrictions in RUE to minimal amounts or less to complete overhead reaching tasks.    Goal status: INITIAL  4.  Pt will increase RUE ROM to [facility] in order to complete dressing and bathing tasks without compensatory strategies for overhead and behind the back reaching.   Goal status: INITIAL  5.  Pt will increase RUE strength to 5/5 in order to lift and carry objects when playing and taking care of her children.   Goal status: INITIAL   ASSESSMENT:  CLINICAL IMPRESSION: Patient is a 37 y.o. female who was seen today for occupational therapy evaluation for R shoulder pain.   PERFORMANCE DEFICITS: in functional skills including ADLs, IADLs, edema, tone, ROM, strength, pain, fascial restrictions, Fine motor control, Gross motor control, body mechanics, and UE functional use.  IMPAIRMENTS: are limiting patient from ADLs, IADLs, rest and sleep, work, leisure, and social participation.   COMORBIDITIES: has no other co-morbidities that affects occupational performance. Patient will benefit from skilled OT to address above impairments  and improve overall function.  MODIFICATION OR ASSISTANCE TO COMPLETE EVALUATION: No modification of tasks or assist necessary to complete an evaluation.  OT OCCUPATIONAL PROFILE AND HISTORY: Problem focused assessment: Including review of records relating to presenting problem.  CLINICAL DECISION MAKING: LOW - limited treatment options, no task modification necessary  REHAB POTENTIAL: Good  EVALUATION COMPLEXITY: Low      PLAN:  OT FREQUENCY: 2x/week  OT DURATION: 4 weeks  PLANNED INTERVENTIONS: self care/ADL training, therapeutic exercise, therapeutic activity, manual therapy, passive range of motion, functional mobility training, electrical stimulation, ultrasound, moist heat, cryotherapy, patient/family education, coping strategies training, and DME and/or AE instructions  RECOMMENDED OTHER SERVICES: N/A  CONSULTED AND AGREED WITH PLAN OF CARE: Patient  PLAN FOR NEXT SESSION: Manual Therapy, P/ROM, AA/ROM, Proximal Shoulder Exercises   Trish Mage, OTR/L [facility] [phone] Zion Lint Rosemarie Beath, OT 02/18/2023, 1:52 PM

## 2023-02-25 ENCOUNTER — Ambulatory Visit (HOSPITAL_COMMUNITY): Payer: BC Managed Care – PPO | Admitting: Occupational Therapy

## 2023-02-25 ENCOUNTER — Encounter (HOSPITAL_COMMUNITY): Payer: Self-pay | Admitting: Occupational Therapy

## 2023-02-25 DIAGNOSIS — M25511 Pain in right shoulder: Secondary | ICD-10-CM | POA: Diagnosis not present

## 2023-02-25 DIAGNOSIS — R29898 Other symptoms and signs involving the musculoskeletal system: Secondary | ICD-10-CM | POA: Diagnosis not present

## 2023-02-25 DIAGNOSIS — M25611 Stiffness of right shoulder, not elsewhere classified: Secondary | ICD-10-CM

## 2023-02-25 DIAGNOSIS — M25519 Pain in unspecified shoulder: Secondary | ICD-10-CM | POA: Diagnosis not present

## 2023-02-25 NOTE — Patient Instructions (Signed)
Perform each exercise ____10-15____ reps. 2-3x days.   1) Protraction   Start by holding a wand or cane at chest height.  Next, slowly push the wand outwards in front of your body so that your elbows become fully straightened. Then, return to the original position.     2) Shoulder FLEXION   In the standing position, hold a wand/cane with both arms, palms down on both sides. Raise up the wand/cane allowing your unaffected arm to perform most of the effort. Your affected arm should be partially relaxed.      3) Internal/External ROTATION   In the standing position, hold a wand/cane with both hands keeping your elbows bent. Move your arms and wand/cane to one side.  Your affected arm should be partially relaxed while your unaffected arm performs most of the effort.       4) Shoulder ABDUCTION   While holding a wand/cane palm face up on the injured side and palm face down on the uninjured side, slowly raise up your injured arm to the side.        5) Horizontal Abduction/Adduction      Straight arms holding cane at shoulder height, bring cane to right, center, left. Repeat starting to left.   Copyright  VHI. All rights reserved.    Repeat all exercises 10-15 times, 1-2 times per day.  1) Shoulder Protraction    Begin with elbows by your side, slowly "punch" straight out in front of you.      2) Shoulder Flexion  Supine:     Standing:         Begin with arms at your side with thumbs pointed up, slowly raise both arms up and forward towards overhead.               3) Horizontal abduction/adduction  Supine:   Standing:           Begin with arms straight out in front of you, bring out to the side in at "T" shape. Keep arms straight entire time.                 4) Internal & External Rotation   Supine:     Standing:     Stand with elbows at the side and elbows bent 90 degrees. Move your forearms away from your body, then  bring back inward toward the body.     5) Shoulder Abduction  Supine:     Standing:       Lying on your back begin with your arms flat on the table next to your side. Slowly move your arms out to the side so that they go overhead, in a jumping jack or snow angel movement.   

## 2023-02-25 NOTE — Therapy (Signed)
OUTPATIENT OCCUPATIONAL THERAPY ORTHO TREATMENT NOTE  Patient Name: Lindsey [name] MRN: [id number] DOB:April 12, 1986, 37 y.o., female Today's Date: 02/25/2023  PCP: Philip Aspen, Limmie Patricia, MD REFERRING PROVIDER: Philip Aspen, Limmie Patricia, MD  END OF SESSION:  OT End of Session - 02/25/23 0907     Visit Number 2    Number of Visits 9    Date for OT Re-Evaluation 03/25/23    Authorization Type BCBS    OT Start Time 0907    OT Stop Time 0945    OT Time Calculation (min) 38 min    Activity Tolerance Patient tolerated treatment well    Behavior During Therapy [facility] for tasks assessed/performed             Past Medical History:  Diagnosis Date   Diabetes mellitus    Diabetes mellitus affecting pregnancy in first trimester 03/16/2016   Hypothyroidism, adult 08/30/2019   Pregnant 03/16/2016   Supervision of other high-risk pregnancy 03/31/2016    [facility] at   8+5 weeks FOB  Hassel Neth 37 yo wm second child Dating By  LMP and [place] Pap  03/31/16 GC/CT Initial:                36+wks: Genetic Screen NT/IT:  CF screen  Anatomic [place]  Flu vaccine  Tdap Recommended ~ 28wks Glucose Screen  2 hr GBS  Feed Preference  Contraception  Circumcision  Childbirth Classes  Pediatrician     Past Surgical History:  Procedure Laterality Date   CESAREAN SECTION N/A 10/29/2016   Procedure: PRIMARY CESAREAN SECTION;  Surgeon: Lazaro Arms, MD;  Location: [facility] BIRTHING SUITES;  Service: Obstetrics;  Laterality: N/A;   NO PAST SURGERIES     Patient Active Problem List   Diagnosis Date Noted   Complex cyst of left ovary 09/01/2022   Abdominal pain, epigastric 08/05/2022   Nausea and vomiting 08/05/2022   Pelvic pain 06/28/2022   Pregnancy examination or test, negative result 06/28/2022   Irregular periods 06/28/2022   Dyspareunia, female 06/28/2022   Hyperlipidemia associated with type 2 diabetes mellitus (HCC) 08/12/2021   Well woman exam with routine gynecological exam  07/18/2020   Hypothyroidism, adult 08/30/2019   Acne 10/21/2017   Insulin pump status 08/17/2012   Hypoglycemia associated with diabetes (HCC) 08/17/2012   Diabetes mellitus type I (HCC) 08/27/2011    ONSET DATE: Sep 2023  REFERRING DIAG: R shoulder pain  THERAPY DIAG:  Acute pain of right shoulder  Stiffness of right shoulder, not elsewhere classified  Other symptoms and signs involving the musculoskeletal system  Rationale for Evaluation and Treatment: Rehabilitation  SUBJECTIVE:   SUBJECTIVE STATEMENT: "I just do things then I feel it." Pt accompanied by: self  PERTINENT HISTORY: No significant PMH findings. Pt has reported sporadic shoulder pain for ~3 years, which has been worsening in the past 6 months.   PRECAUTIONS: None  WEIGHT BEARING RESTRICTIONS: No  PAIN:  Are you having pain?  None at rest - 8/10 with movement  FALLS: Has patient fallen in last 6 months? No  LIVING ENVIRONMENT: Lives with: lives with their family Lives in: House/apartment  PLOF: Independent  PATIENT GOALS: To reduce pain and full range of motion  NEXT MD VISIT: none  OBJECTIVE:   HAND DOMINANCE: Right  ADLs: Overall ADLs: Pt is having to use moderate compensatory strategies for ADL's where she does not have to lift her arm to complete them. Limited in IADL's as far as lifting.  FUNCTIONAL OUTCOME MEASURES: FOTO: 60.08/100  UPPER EXTREMITY ROM:     Active ROM Right eval  Shoulder flexion 121  Shoulder abduction 110  Shoulder internal rotation 90  Shoulder external rotation 20  (Blank rows = not tested)  UPPER EXTREMITY MMT:     MMT Right eval  Shoulder flexion 4+/5  Shoulder abduction 4/5  Shoulder adduction 4/5  Shoulder extension 4+/5  Shoulder internal rotation 4+/5  Shoulder external rotation 4+/5  (Blank rows = not tested)  SENSATION: WFL  EDEMA: no swelling noted  OBSERVATIONS: Moderate fascial restrictions noted in anterior shoulder girdle and  subscapularis   TODAY'S TREATMENT:                                                                                                                              DATE:   02/25/23 -Manual Therapy: myofascial release and trigger point applied to biceps, trapezius, and axillary region in order to reduce fascial restrictions and pain to increase ROM. -AA/ROM: supine, 2lb dowel, flexion, abduction, protraction, horizontal abduction, er/IR, x12 -A/ROM: supine, flexion, abduction, protraction, horizontal abduction, er/IR, x12 -Stretching: flexion, er, bicep, er behind back, IR behind back, doorway stretch, 4x10"   PATIENT EDUCATION: Education details: AA/ROM and A/ROM Person educated: Patient Education method: Programmer, multimedia, Facilities manager, and Handouts Education comprehension: verbalized understanding and returned demonstration  HOME EXERCISE PROGRAM: 5/10: Table Slides and Wall Slides 5/17: AA/ROM and A/ROM  GOALS: Goals reviewed with patient? Yes  SHORT TERM GOALS: Target date: 03/25/23  Pt will be provided and educated on an HEP for improved RUE mobility and strength for ADL completion.   Goal status: IN PROGRESS  2.  Pt will decrease RUE pain to 3/10 in order to sleep 3+ consecutive hours without waking due to pain.   Goal status: IN PROGRESS  3.  Pt will decrease fascial restrictions in RUE to minimal amounts or less to complete overhead reaching tasks.    Goal status: IN PROGRESS  4.  Pt will increase RUE ROM to [facility] in order to complete dressing and bathing tasks without compensatory strategies for overhead and behind the back reaching.   Goal status: IN PROGRESS  5.  Pt will increase RUE strength to 5/5 in order to lift and carry objects when playing and taking care of her children.   Goal status: IN PROGRESS   ASSESSMENT:  CLINICAL IMPRESSION: This session pt started working on ROM following good movement patterns. With AA/ROM she did not report pain, she felt that it  was just helping her stretch in different areas of her shoulder. With A/ROM she did have pain in abduction and external rotation when attempting to push them into end ranges. OT provided manual therapy, as she had moderate fascial restrictions along her biceps, trapezius, and subscapularis region. To continue assisting with reducing the fascial restrictions and stretching into her end ranges, OT had patient initiate stretches. Verbal and tactile cuing provided throughout session for positioning and technique.  PERFORMANCE DEFICITS: in functional skills including ADLs, IADLs, edema, tone, ROM, strength, pain, fascial restrictions, Fine motor control, Gross motor control, body mechanics, and UE functional use.   PLAN:  OT FREQUENCY: 2x/week  OT DURATION: 4 weeks  PLANNED INTERVENTIONS: self care/ADL training, therapeutic exercise, therapeutic activity, manual therapy, passive range of motion, functional mobility training, electrical stimulation, ultrasound, moist heat, cryotherapy, patient/family education, coping strategies training, and DME and/or AE instructions  RECOMMENDED OTHER SERVICES: N/A  CONSULTED AND AGREED WITH PLAN OF CARE: Patient  PLAN FOR NEXT SESSION: Manual Therapy, P/ROM, AA/ROM, Proximal Shoulder Exercises   Trish Mage, OTR/L [facility] [phone] Novella Abraha Rosemarie Beath, OT 02/25/2023, 9:08 AM

## 2023-02-28 ENCOUNTER — Encounter (HOSPITAL_COMMUNITY): Payer: Self-pay | Admitting: Occupational Therapy

## 2023-02-28 ENCOUNTER — Ambulatory Visit (HOSPITAL_COMMUNITY): Payer: BC Managed Care – PPO | Admitting: Occupational Therapy

## 2023-02-28 DIAGNOSIS — R29898 Other symptoms and signs involving the musculoskeletal system: Secondary | ICD-10-CM

## 2023-02-28 DIAGNOSIS — M25511 Pain in right shoulder: Secondary | ICD-10-CM | POA: Diagnosis not present

## 2023-02-28 DIAGNOSIS — M25611 Stiffness of right shoulder, not elsewhere classified: Secondary | ICD-10-CM

## 2023-02-28 DIAGNOSIS — M25519 Pain in unspecified shoulder: Secondary | ICD-10-CM | POA: Diagnosis not present

## 2023-02-28 NOTE — Therapy (Signed)
OUTPATIENT OCCUPATIONAL THERAPY ORTHO TREATMENT NOTE  Patient Name: Lindsey [name] MRN: [id number] DOB:Sep 13, 1986, 37 y.o., female Today's Date: 02/28/2023  PCP: Philip Aspen, Limmie Patricia, MD REFERRING PROVIDER: Philip Aspen, Limmie Patricia, MD  END OF SESSION:  OT End of Session - 02/28/23 1437     Visit Number 3    Number of Visits 9    Date for OT Re-Evaluation 03/25/23    Authorization Type BCBS    OT Start Time 1435    OT Stop Time 1515    OT Time Calculation (min) 40 min    Activity Tolerance Patient tolerated treatment well    Behavior During Therapy [facility] for tasks assessed/performed             Past Medical History:  Diagnosis Date   Diabetes mellitus    Diabetes mellitus affecting pregnancy in first trimester 03/16/2016   Hypothyroidism, adult 08/30/2019   Pregnant 03/16/2016   Supervision of other high-risk pregnancy 03/31/2016    [facility] at   8+5 weeks FOB  Hassel Neth 37 yo wm second child Dating By  LMP and [place] Pap  03/31/16 GC/CT Initial:                36+wks: Genetic Screen NT/IT:  CF screen  Anatomic [place]  Flu vaccine  Tdap Recommended ~ 28wks Glucose Screen  2 hr GBS  Feed Preference  Contraception  Circumcision  Childbirth Classes  Pediatrician     Past Surgical History:  Procedure Laterality Date   CESAREAN SECTION N/A 10/29/2016   Procedure: PRIMARY CESAREAN SECTION;  Surgeon: Lazaro Arms, MD;  Location: [facility] BIRTHING SUITES;  Service: Obstetrics;  Laterality: N/A;   NO PAST SURGERIES     Patient Active Problem List   Diagnosis Date Noted   Complex cyst of left ovary 09/01/2022   Abdominal pain, epigastric 08/05/2022   Nausea and vomiting 08/05/2022   Pelvic pain 06/28/2022   Pregnancy examination or test, negative result 06/28/2022   Irregular periods 06/28/2022   Dyspareunia, female 06/28/2022   Hyperlipidemia associated with type 2 diabetes mellitus (HCC) 08/12/2021   Well woman exam with routine gynecological exam  07/18/2020   Hypothyroidism, adult 08/30/2019   Acne 10/21/2017   Insulin pump status 08/17/2012   Hypoglycemia associated with diabetes (HCC) 08/17/2012   Diabetes mellitus type I (HCC) 08/27/2011    ONSET DATE: Sep 2023  REFERRING DIAG: R shoulder pain  THERAPY DIAG:  Acute pain of right shoulder  Stiffness of right shoulder, not elsewhere classified  Other symptoms and signs involving the musculoskeletal system  Rationale for Evaluation and Treatment: Rehabilitation  SUBJECTIVE:   SUBJECTIVE STATEMENT: "I reached up to grab a hat this weekend and it didn't hurt!" Pt accompanied by: self  PERTINENT HISTORY: No significant PMH findings. Pt has reported sporadic shoulder pain for ~3 years, which has been worsening in the past 6 months.   PRECAUTIONS: None  WEIGHT BEARING RESTRICTIONS: No  PAIN:  Are you having pain?  None at rest - 8/10 with movement  FALLS: Has patient fallen in last 6 months? No  LIVING ENVIRONMENT: Lives with: lives with their family Lives in: House/apartment  PLOF: Independent  PATIENT GOALS: To reduce pain and full range of motion  NEXT MD VISIT: none  OBJECTIVE:   HAND DOMINANCE: Right  ADLs: Overall ADLs: Pt is having to use moderate compensatory strategies for ADL's where she does not have to lift her arm to complete them. Limited in  IADL's as far as lifting.   FUNCTIONAL OUTCOME MEASURES: FOTO: 60.08/100  UPPER EXTREMITY ROM:     Active ROM Right eval  Shoulder flexion 121  Shoulder abduction 110  Shoulder internal rotation 90  Shoulder external rotation 20  (Blank rows = not tested)  UPPER EXTREMITY MMT:     MMT Right eval  Shoulder flexion 4+/5  Shoulder abduction 4/5  Shoulder adduction 4/5  Shoulder extension 4+/5  Shoulder internal rotation 4+/5  Shoulder external rotation 4+/5  (Blank rows = not tested)  SENSATION: WFL  EDEMA: no swelling noted  OBSERVATIONS: Moderate fascial restrictions noted in  anterior shoulder girdle and subscapularis   TODAY'S TREATMENT:                                                                                                                              DATE:   02/28/23 -A/ROM: seated, flexion, abduction, protraction, horizontal abduction, er/IR, x10 -Isometrics: flexion, extension, abduction, er, IR, 5x10" -Arm's on Fire x60" -Latissimus Stretch 2x30" -Scapular Strengthening: green band, extension, retraction, protraction, rows, x15  02/25/23 -Manual Therapy: myofascial release and trigger point applied to biceps, trapezius, and axillary region in order to reduce fascial restrictions and pain to increase ROM. -AA/ROM: supine, 2lb dowel, flexion, abduction, protraction, horizontal abduction, er/IR, x12 -A/ROM: supine, flexion, abduction, protraction, horizontal abduction, er/IR, x12 -Stretching: flexion, er, bicep, er behind back, IR behind back, doorway stretch, 4x10"   PATIENT EDUCATION: Education details: AA/ROM and A/ROM Person educated: Patient Education method: Programmer, multimedia, Facilities manager, and Handouts Education comprehension: verbalized understanding and returned demonstration  HOME EXERCISE PROGRAM: 5/10: Table Slides and Wall Slides 5/17: AA/ROM and A/ROM  GOALS: Goals reviewed with patient? Yes  SHORT TERM GOALS: Target date: 03/25/23  Pt will be provided and educated on an HEP for improved RUE mobility and strength for ADL completion.   Goal status: IN PROGRESS  2.  Pt will decrease RUE pain to 3/10 in order to sleep 3+ consecutive hours without waking due to pain.   Goal status: IN PROGRESS  3.  Pt will decrease fascial restrictions in RUE to minimal amounts or less to complete overhead reaching tasks.    Goal status: IN PROGRESS  4.  Pt will increase RUE ROM to [facility] in order to complete dressing and bathing tasks without compensatory strategies for overhead and behind the back reaching.   Goal status: IN PROGRESS  5.  Pt  will increase RUE strength to 5/5 in order to lift and carry objects when playing and taking care of her children.   Goal status: IN PROGRESS   ASSESSMENT:  CLINICAL IMPRESSION: This session pt reporting no pain at the start of the session and stating that the stretches appear to be providing her with a lot of relief. OT added Isometrics and Scapular strengthening this session to encourage strengthening and stabilizing of the shoulder girdle. She reported mild discomfort with isometrics, otherwise no complaints. OT providing verbal and tactile  cuing for positioning and technique throughout all exercises.   PERFORMANCE DEFICITS: in functional skills including ADLs, IADLs, edema, tone, ROM, strength, pain, fascial restrictions, Fine motor control, Gross motor control, body mechanics, and UE functional use.   PLAN:  OT FREQUENCY: 2x/week  OT DURATION: 4 weeks  PLANNED INTERVENTIONS: self care/ADL training, therapeutic exercise, therapeutic activity, manual therapy, passive range of motion, functional mobility training, electrical stimulation, ultrasound, moist heat, cryotherapy, patient/family education, coping strategies training, and DME and/or AE instructions  RECOMMENDED OTHER SERVICES: N/A  CONSULTED AND AGREED WITH PLAN OF CARE: Patient  PLAN FOR NEXT SESSION: Manual Therapy, P/ROM, AA/ROM, Proximal Shoulder Exercises, A/ROM, scapular Strengthening, Isometrics   Anab Vivar Bing Plume, OTR/L [facility] [phone] Kennyth Arnold, OT 02/28/2023, 2:38 PM

## 2023-02-28 NOTE — Patient Instructions (Signed)

## 2023-03-02 ENCOUNTER — Encounter: Payer: Self-pay | Admitting: Internal Medicine

## 2023-03-02 ENCOUNTER — Encounter (HOSPITAL_COMMUNITY): Payer: BC Managed Care – PPO | Admitting: Occupational Therapy

## 2023-03-10 ENCOUNTER — Ambulatory Visit (HOSPITAL_COMMUNITY): Payer: BC Managed Care – PPO | Admitting: Occupational Therapy

## 2023-03-10 ENCOUNTER — Encounter (HOSPITAL_COMMUNITY): Payer: Self-pay | Admitting: Occupational Therapy

## 2023-03-10 DIAGNOSIS — M25511 Pain in right shoulder: Secondary | ICD-10-CM | POA: Diagnosis not present

## 2023-03-10 DIAGNOSIS — M25611 Stiffness of right shoulder, not elsewhere classified: Secondary | ICD-10-CM

## 2023-03-10 DIAGNOSIS — R29898 Other symptoms and signs involving the musculoskeletal system: Secondary | ICD-10-CM

## 2023-03-10 DIAGNOSIS — M25519 Pain in unspecified shoulder: Secondary | ICD-10-CM | POA: Diagnosis not present

## 2023-03-10 NOTE — Patient Instructions (Signed)

## 2023-03-10 NOTE — Therapy (Signed)
OUTPATIENT OCCUPATIONAL THERAPY ORTHO TREATMENT NOTE  Patient Name: Lindsey [name] MRN: [id number] DOB:June 20, 1986, 37 y.o., female Today's Date: 03/10/2023  PCP: Philip Aspen, Limmie Patricia, MD REFERRING PROVIDER: Philip Aspen, Limmie Patricia, MD  END OF SESSION:  OT End of Session - 03/10/23 1036     Visit Number 4    Number of Visits 9    Date for OT Re-Evaluation 03/25/23    Authorization Type BCBS    OT Start Time [id number]    OT Stop Time 1033    OT Time Calculation (min) 42 min    Activity Tolerance Patient tolerated treatment well    Behavior During Therapy [facility] for tasks assessed/performed              Past Medical History:  Diagnosis Date   Diabetes mellitus    Diabetes mellitus affecting pregnancy in first trimester 03/16/2016   Hypothyroidism, adult 08/30/2019   Pregnant 03/16/2016   Supervision of other high-risk pregnancy 03/31/2016    [facility] at   8+5 weeks FOB  Hassel Neth 37 yo wm second child Dating By  LMP and [place] Pap  03/31/16 GC/CT Initial:                36+wks: Genetic Screen NT/IT:  CF screen  Anatomic [place]  Flu vaccine  Tdap Recommended ~ 28wks Glucose Screen  2 hr GBS  Feed Preference  Contraception  Circumcision  Childbirth Classes  Pediatrician     Past Surgical History:  Procedure Laterality Date   CESAREAN SECTION N/A 10/29/2016   Procedure: PRIMARY CESAREAN SECTION;  Surgeon: Lazaro Arms, MD;  Location: [facility] BIRTHING SUITES;  Service: Obstetrics;  Laterality: N/A;   NO PAST SURGERIES     Patient Active Problem List   Diagnosis Date Noted   Complex cyst of left ovary 09/01/2022   Abdominal pain, epigastric 08/05/2022   Nausea and vomiting 08/05/2022   Pelvic pain 06/28/2022   Pregnancy examination or test, negative result 06/28/2022   Irregular periods 06/28/2022   Dyspareunia, female 06/28/2022   Hyperlipidemia associated with type 2 diabetes mellitus (HCC) 08/12/2021   Well woman exam with routine gynecological exam  07/18/2020   Hypothyroidism, adult 08/30/2019   Acne 10/21/2017   Insulin pump status 08/17/2012   Hypoglycemia associated with diabetes (HCC) 08/17/2012   Diabetes mellitus type I (HCC) 08/27/2011    ONSET DATE: Sep 2023  REFERRING DIAG: R shoulder pain  THERAPY DIAG:  Acute pain of right shoulder  Other symptoms and signs involving the musculoskeletal system  Stiffness of right shoulder, not elsewhere classified  Rationale for Evaluation and Treatment: Rehabilitation  SUBJECTIVE:   SUBJECTIVE STATEMENT: "I feel like it has gotten a lot better." Pt accompanied by: self  PERTINENT HISTORY: No significant PMH findings. Pt has reported sporadic shoulder pain for ~3 years, which has been worsening in the past 6 months.   PRECAUTIONS: None  WEIGHT BEARING RESTRICTIONS: No  PAIN:  Are you having pain?  None at rest - 5 with movement, if hitting the wrong spot  FALLS: Has patient fallen in last 6 months? No  LIVING ENVIRONMENT: Lives with: lives with their family Lives in: House/apartment  PLOF: Independent  PATIENT GOALS: To reduce pain and full range of motion  NEXT MD VISIT: none  OBJECTIVE:   HAND DOMINANCE: Right  ADLs: Overall ADLs: Pt is having to use moderate compensatory strategies for ADL's where she does not have to lift her arm to complete them.  Limited in IADL's as far as lifting.   FUNCTIONAL OUTCOME MEASURES: FOTO: 60.08/100  UPPER EXTREMITY ROM:     Active ROM Right eval  Shoulder flexion 121  Shoulder abduction 110  Shoulder internal rotation 90  Shoulder external rotation 20  (Blank rows = not tested)  UPPER EXTREMITY MMT:     MMT Right eval  Shoulder flexion 4+/5  Shoulder abduction 4/5  Shoulder adduction 4/5  Shoulder extension 4+/5  Shoulder internal rotation 4+/5  Shoulder external rotation 4+/5  (Blank rows = not tested)  SENSATION: WFL  EDEMA: no swelling noted  OBSERVATIONS: Moderate fascial restrictions noted in  anterior shoulder girdle and subscapularis   TODAY'S TREATMENT:                                                                                                                              DATE:   03/10/23 -A/ROM: seated, 1lb, flexion, abduction, protraction, horizontal abduction, er/IR, x12 -Proximal Shoulder Exercises: 2lbs, paddles, criss cross, circles  both directions, x20 -Ball on the wall: ABC's -Scapular Strengthening: green band, extension, retraction, protraction, rows, x15 -Shoulder Strengthening: green band, flexion, abduction, horizontal abduction, er/IR, x10 -UBE: level 2, 3.5 KPH+, 2.5 min forwards and backwards  02/28/23 -A/ROM: seated, flexion, abduction, protraction, horizontal abduction, er/IR, x10 -Isometrics: flexion, extension, abduction, er, IR, 5x10" -Arm's on Fire x60" -Latissimus Stretch 2x30" -Scapular Strengthening: green band, extension, retraction, protraction, rows, x15  02/25/23 -Manual Therapy: myofascial release and trigger point applied to biceps, trapezius, and axillary region in order to reduce fascial restrictions and pain to increase ROM. -AA/ROM: supine, 2lb dowel, flexion, abduction, protraction, horizontal abduction, er/IR, x12 -A/ROM: supine, flexion, abduction, protraction, horizontal abduction, er/IR, x12 -Stretching: flexion, er, bicep, er behind back, IR behind back, doorway stretch, 4x10"   PATIENT EDUCATION: Education details: Publishing rights manager Person educated: Patient Education method: Explanation, Demonstration, and Handouts Education comprehension: verbalized understanding and returned demonstration  HOME EXERCISE PROGRAM: 5/10: Table Slides and Wall Slides 5/17: AA/ROM and A/ROM 5/30: Scapular Strengthening  GOALS: Goals reviewed with patient? Yes  SHORT TERM GOALS: Target date: 03/25/23  Pt will be provided and educated on an HEP for improved RUE mobility and strength for ADL completion.   Goal status: IN  PROGRESS  2.  Pt will decrease RUE pain to 3/10 in order to sleep 3+ consecutive hours without waking due to pain.   Goal status: IN PROGRESS  3.  Pt will decrease fascial restrictions in RUE to minimal amounts or less to complete overhead reaching tasks.    Goal status: IN PROGRESS  4.  Pt will increase RUE ROM to [facility] in order to complete dressing and bathing tasks without compensatory strategies for overhead and behind the back reaching.   Goal status: IN PROGRESS  5.  Pt will increase RUE strength to 5/5 in order to lift and carry objects when playing and taking care of her children.   Goal status: IN PROGRESS  ASSESSMENT:  CLINICAL IMPRESSION: Pt's overall pain and movement pattern are improving well. This session she reported no pain, until working on scapular strengthening. With all movements that require scapular activity, she has increased pain and pulling sensation, however other shoulder movements she only feels mild muscle fatigue. OT providing verbal and visual cuing for positioning and technique with all exercises.   PERFORMANCE DEFICITS: in functional skills including ADLs, IADLs, edema, tone, ROM, strength, pain, fascial restrictions, Fine motor control, Gross motor control, body mechanics, and UE functional use.   PLAN:  OT FREQUENCY: 2x/week  OT DURATION: 4 weeks  PLANNED INTERVENTIONS: self care/ADL training, therapeutic exercise, therapeutic activity, manual therapy, passive range of motion, functional mobility training, electrical stimulation, ultrasound, moist heat, cryotherapy, patient/family education, coping strategies training, and DME and/or AE instructions  RECOMMENDED OTHER SERVICES: N/A  CONSULTED AND AGREED WITH PLAN OF CARE: Patient  PLAN FOR NEXT SESSION: Manual Therapy, P/ROM, AA/ROM, Proximal Shoulder Exercises, A/ROM, scapular Strengthening, Isometrics   Lebron Nauert Bing Plume, OTR/L [facility] [phone] Kennyth Arnold,  OT 03/10/2023, 10:36 AM

## 2023-03-11 ENCOUNTER — Ambulatory Visit (HOSPITAL_COMMUNITY): Payer: BC Managed Care – PPO | Admitting: Occupational Therapy

## 2023-03-11 ENCOUNTER — Encounter (HOSPITAL_COMMUNITY): Payer: Self-pay | Admitting: Occupational Therapy

## 2023-03-11 DIAGNOSIS — R29898 Other symptoms and signs involving the musculoskeletal system: Secondary | ICD-10-CM

## 2023-03-11 DIAGNOSIS — M25511 Pain in right shoulder: Secondary | ICD-10-CM

## 2023-03-11 DIAGNOSIS — M25611 Stiffness of right shoulder, not elsewhere classified: Secondary | ICD-10-CM

## 2023-03-11 DIAGNOSIS — M25519 Pain in unspecified shoulder: Secondary | ICD-10-CM | POA: Diagnosis not present

## 2023-03-11 NOTE — Therapy (Signed)
OUTPATIENT OCCUPATIONAL THERAPY ORTHO TREATMENT NOTE  Patient Name: Lindsey [name] MRN: [id number] DOB:[birth year], 37 y.o., female Today's Date: 03/11/2023  PCP: Philip Aspen, Limmie Patricia, MD REFERRING PROVIDER: Philip Aspen, Limmie Patricia, MD  END OF SESSION:  OT End of Session - 03/11/23 1428     Visit Number 5    Number of Visits 9    Date for OT Re-Evaluation 03/25/23    Authorization Type BCBS    OT Start Time 1307    OT Stop Time 1345    OT Time Calculation (min) 38 min    Activity Tolerance Patient tolerated treatment well    Behavior During Therapy [facility] for tasks assessed/performed             Past Medical History:  Diagnosis Date   Diabetes mellitus    Diabetes mellitus affecting pregnancy in first trimester 03/16/2016   Hypothyroidism, adult 08/30/2019   Pregnant 03/16/2016   Supervision of other high-risk pregnancy 03/31/2016    [facility] at   8+5 weeks FOB  Hassel Neth 37 yo wm second child Dating By  LMP and [place] Pap  03/31/16 GC/CT Initial:                36+wks: Genetic Screen NT/IT:  CF screen  Anatomic [place]  Flu vaccine  Tdap Recommended ~ 28wks Glucose Screen  2 hr GBS  Feed Preference  Contraception  Circumcision  Childbirth Classes  Pediatrician     Past Surgical History:  Procedure Laterality Date   CESAREAN SECTION N/A 10/29/2016   Procedure: PRIMARY CESAREAN SECTION;  Surgeon: Lazaro Arms, MD;  Location: [facility] BIRTHING SUIT[facility];  Service: Obstetrics;  Laterality: N/A;   NO PAST SURGERIES     Patient Active Problem List   Diagnosis Date Noted   Complex cyst of left ovary 09/01/2022   Abdominal pain, epigastric 08/05/2022   Nausea and vomiting 08/05/2022   Pelvic pain 06/28/2022   Pregnancy examination or test, negative result 06/28/2022   Irregular periods 06/28/2022   Dyspareunia, female 06/28/2022   Hyperlipidemia associated with type 2 diabetes mellitus (HCC) 08/12/2021   Well woman exam with routine gynecological exam  07/18/2020   Hypothyroidism, adult 08/30/2019   Acne 10/21/2017   Insulin pump status 08/17/2012   Hypoglycemia associated with diabetes (HCC) 08/17/2012   Diabetes mellitus type I (HCC) 08/27/2011    ONSET DATE: Sep 2023  REFERRING DIAG: R shoulder pain  THERAPY DIAG:  Acute pain of right shoulder  Other symptoms and signs involving the musculoskeletal system  Stiffness of right shoulder, not elsewhere classified  Rationale for Evaluation and Treatment: Rehabilitation  SUBJECTIVE:   SUBJECTIVE STATEMENT: "I tried to throw a softball last night and it still hurts pretty bad" Pt accompanied by: self  PERTINENT HISTORY: No significant PMH findings. Pt has reported sporadic shoulder pain for ~3 years, which has been worsening in the past 6 months.   PRECAUTIONS: None  WEIGHT BEARING RESTRICTIONS: No  PAIN:  Are you having pain?  None at rest - 5 with movement, if hitting the wrong spot  FALLS: Has patient fallen in last 6 months? No  LIVING ENVIRONMENT: Lives with: lives with their family Lives in: House/apartment  PLOF: Independent  PATIENT GOALS: To reduce pain and full range of motion  NEXT MD VISIT: none  OBJECTIVE:   HAND DOMINANCE: Right  ADLs: Overall ADLs: Pt is having to use moderate compensatory strategies for ADL's where she does not have to lift her  arm to complete them. Limited in IADL's as far as lifting.   FUNCTIONAL OUTCOME MEASURES: FOTO: 60.08/100  UPPER EXTREMITY ROM:     Active ROM Right eval  Shoulder flexion 121  Shoulder abduction 110  Shoulder internal rotation 90  Shoulder external rotation 20  (Blank rows = not tested)  UPPER EXTREMITY MMT:     MMT Right eval  Shoulder flexion 4+/5  Shoulder abduction 4/5  Shoulder adduction 4/5  Shoulder extension 4+/5  Shoulder internal rotation 4+/5  Shoulder external rotation 4+/5  (Blank rows = not tested)  SENSATION: WFL  EDEMA: no swelling noted  OBSERVATIONS: Moderate  fascial restrictions noted in anterior shoulder girdle and subscapularis   TODAY'S TREATMENT:                                                                                                                              DATE:   03/11/23 -Newman Pies Rolls on the Wall: flexion, abduction, x10 -Therapy Ball Exercises: protraction, overhead press, flexion, V ups, circles both directions, x12 -Loop Band Exercises: Wall Taps, Wall Clocks, Wall V's, x12, green band -Latissimus stretch, 4x30" switching hands on top -Overhead lacing  03/10/23 -A/ROM: seated, 1lb, flexion, abduction, protraction, horizontal abduction, er/IR, x12 -Proximal Shoulder Exercises: 2lbs, paddles, criss cross, circles  both directions, x20 -Ball on the wall: ABC's -Scapular Strengthening: green band, extension, retraction, protraction, rows, x15 -Shoulder Strengthening: green band, flexion, abduction, horizontal abduction, er/IR, x10 -UBE: level 2, 3.5 KPH+, 2.5 min forwards and backwards  02/28/23 -A/ROM: seated, flexion, abduction, protraction, horizontal abduction, er/IR, x10 -Isometrics: flexion, extension, abduction, er, IR, 5x10" -Arm's on Fire x60" -Latissimus Stretch 2x30" -Scapular Strengthening: green band, extension, retraction, protraction, rows, x15   PATIENT EDUCATION: Education details: Loop Band Exercises Person educated: Patient Education method: Explanation, Demonstration, and Handouts Education comprehension: verbalized understanding and returned demonstration  HOME EXERCISE PROGRAM: 5/10: Table Slides and Wall Slides 5/17: AA/ROM and A/ROM 5/30: Scapular Strengthening 5/31: Loop Band Exercises  GOALS: Goals reviewed with patient? Yes  SHORT TERM GOALS: Target date: 03/25/23  Pt will be provided and educated on an HEP for improved RUE mobility and strength for ADL completion.   Goal status: IN PROGRESS  2.  Pt will decrease RUE pain to 3/10 in order to sleep 3+ consecutive hours without waking  due to pain.   Goal status: IN PROGRESS  3.  Pt will decrease fascial restrictions in RUE to minimal amounts or less to complete overhead reaching tasks.    Goal status: IN PROGRESS  4.  Pt will increase RUE ROM to [facility] in order to complete dressing and bathing tasks without compensatory strategies for overhead and behind the back reaching.   Goal status: IN PROGRESS  5.  Pt will increase RUE strength to 5/5 in order to lift and carry objects when playing and taking care of her children.   Goal status: IN PROGRESS   ASSESSMENT:  CLINICAL IMPRESSION: his session pt  and OT were able to narrow down her pain to the rhomboids and teres muscles, as all of her pain stems from her R shoulder blade and back. OT continuing to add exercises targetting the scapula, however this session also focused on shoulder girdle strengtening and shoulder endurance. Her pain remained minimal this session with good movement patterns throughout. OT providing verbal and visual cuing for body mechanics and positioning.   PERFORMANCE DEFICITS: in functional skills including ADLs, IADLs, edema, tone, ROM, strength, pain, fascial restrictions, Fine motor control, Gross motor control, body mechanics, and UE functional use.   PLAN:  OT FREQUENCY: 2x/week  OT DURATION: 4 weeks  PLANNED INTERVENTIONS: self care/ADL training, therapeutic exercise, therapeutic activity, manual therapy, passive range of motion, functional mobility training, electrical stimulation, ultrasound, moist heat, cryotherapy, patient/family education, coping strategies training, and DME and/or AE instructions  RECOMMENDED OTHER SERVICES: N/A  CONSULTED AND AGREED WITH PLAN OF CARE: Patient  PLAN FOR NEXT SESSION: Manual Therapy, P/ROM, AA/ROM, Proximal Shoulder Exercises, A/ROM, scapular Strengthening (trial exercises from swimmers shoulder program)   Dartanion Teo Bing Plume, OTR/L [facility] [phone] Morell Mears Rosemarie Beath,  OT 03/11/2023, 2:29 PM

## 2023-03-11 NOTE — Patient Instructions (Signed)
Complete _______ repetitions each. Complete _______ time a day.  Wall taps with theraband  With a looped elastic band around forearms/wrists and arms at a 90 angle pressed against the wall. Keeping elbows on the wall, tap right arm out to the right. Hold for 1 second. Return right arm back to neutral. Repeat with left arm.    Wall V slides with Theraband  Place a band loop around hands/forearms and face a wall. Extend both arms diagonally into a V shape on the wall. Hold this stretch for specified amount of time. Lower arms slowly back into neutral position. Repeat.       scap clocks  Tie a loop with a theraband and place around your wrists.  Stand with a wall in front of you.  Picture a clock in front of you.  Place both palms on the wall, arms straight.  While keeping the left/right hand planted, use the right/left hand to pull away and tap each number (1, 3, 5- right OR 11,9,7 left), coming back to center each time.    

## 2023-03-12 ENCOUNTER — Other Ambulatory Visit (HOSPITAL_COMMUNITY): Payer: Self-pay

## 2023-03-14 ENCOUNTER — Encounter (HOSPITAL_COMMUNITY): Payer: Self-pay | Admitting: Occupational Therapy

## 2023-03-14 ENCOUNTER — Ambulatory Visit (HOSPITAL_COMMUNITY): Payer: BC Managed Care – PPO | Attending: Internal Medicine | Admitting: Occupational Therapy

## 2023-03-14 DIAGNOSIS — M25511 Pain in right shoulder: Secondary | ICD-10-CM | POA: Diagnosis not present

## 2023-03-14 DIAGNOSIS — R29898 Other symptoms and signs involving the musculoskeletal system: Secondary | ICD-10-CM | POA: Diagnosis not present

## 2023-03-14 DIAGNOSIS — M25611 Stiffness of right shoulder, not elsewhere classified: Secondary | ICD-10-CM | POA: Insufficient documentation

## 2023-03-14 NOTE — Therapy (Signed)
OUTPATIENT OCCUPATIONAL THERAPY ORTHO TREATMENT NOTE  Patient Name: Lindsey [name] MRN: [id number] DOB:[birth year], 37 y.o., female Today's Date: 03/14/2023  PCP: Philip Aspen, Limmie Patricia, MD REFERRING PROVIDER: Philip Aspen, Limmie Patricia, MD  END OF SESSION:  OT End of Session - 03/14/23 1609     Visit Number 6    Number of Visits 9    Date for OT Re-Evaluation 03/25/23    Authorization Type BCBS    OT Start Time 1525    OT Stop Time 1600    OT Time Calculation (min) 35 min    Activity Tolerance Patient tolerated treatment well    Behavior During Therapy [facility] for tasks assessed/performed             Past Medical History:  Diagnosis Date   Diabetes mellitus    Diabetes mellitus affecting pregnancy in first trimester 03/16/2016   Hypothyroidism, adult 08/30/2019   Pregnant 03/16/2016   Supervision of other high-risk pregnancy 03/31/2016    [facility] at   8+5 weeks FOB  Hassel Neth 37 yo wm second child Dating By  LMP and [place] Pap  03/31/16 GC/CT Initial:                36+wks: Genetic Screen NT/IT:  CF screen  Anatomic [place]  Flu vaccine  Tdap Recommended ~ 28wks Glucose Screen  2 hr GBS  Feed Preference  Contraception  Circumcision  Childbirth Classes  Pediatrician     Past Surgical History:  Procedure Laterality Date   CESAREAN SECTION N/A 10/29/2016   Procedure: PRIMARY CESAREAN SECTION;  Surgeon: Lazaro Arms, MD;  Location: [facility] BIRTHING SUITES;  Service: Obstetrics;  Laterality: N/A;   NO PAST SURGERIES     Patient Active Problem List   Diagnosis Date Noted   Complex cyst of left ovary 09/01/2022   Abdominal pain, epigastric 08/05/2022   Nausea and vomiting 08/05/2022   Pelvic pain 06/28/2022   Pregnancy examination or test, negative result 06/28/2022   Irregular periods 06/28/2022   Dyspareunia, female 06/28/2022   Hyperlipidemia associated with type 2 diabetes mellitus (HCC) 08/12/2021   Well woman exam with routine gynecological exam  07/18/2020   Hypothyroidism, adult 08/30/2019   Acne 10/21/2017   Insulin pump status 08/17/2012   Hypoglycemia associated with diabetes (HCC) 08/17/2012   Diabetes mellitus type I (HCC) 08/27/2011    ONSET DATE: Sep 2023  REFERRING DIAG: R shoulder pain  THERAPY DIAG:  Acute pain of right shoulder  Other symptoms and signs involving the musculoskeletal system  Stiffness of right shoulder, not elsewhere classified  Rationale for Evaluation and Treatment: Rehabilitation  SUBJECTIVE:   SUBJECTIVE STATEMENT: "I tried to throw a softball last night and it still hurts pretty bad" Pt accompanied by: self  PERTINENT HISTORY: No significant PMH findings. Pt has reported sporadic shoulder pain for ~3 years, which has been worsening in the past 6 months.   PRECAUTIONS: None  WEIGHT BEARING RESTRICTIONS: No  PAIN:  Are you having pain?  None at rest - 5 with movement, if hitting the wrong spot  FALLS: Has patient fallen in last 6 months? No  LIVING ENVIRONMENT: Lives with: lives with their family Lives in: House/apartment  PLOF: Independent  PATIENT GOALS: To reduce pain and full range of motion  NEXT MD VISIT: none  OBJECTIVE:   HAND DOMINANCE: Right  ADLs: Overall ADLs: Pt is having to use moderate compensatory strategies for ADL's where she does not have to lift her  arm to complete them. Limited in IADL's as far as lifting.   FUNCTIONAL OUTCOME MEASURES: FOTO: 60.08/100  UPPER EXTREMITY ROM:     Active ROM Right eval  Shoulder flexion 121  Shoulder abduction 110  Shoulder internal rotation 90  Shoulder external rotation 20  (Blank rows = not tested)  UPPER EXTREMITY MMT:     MMT Right eval  Shoulder flexion 4+/5  Shoulder abduction 4/5  Shoulder adduction 4/5  Shoulder extension 4+/5  Shoulder internal rotation 4+/5  Shoulder external rotation 4+/5  (Blank rows = not tested)  SENSATION: WFL  EDEMA: no swelling noted  OBSERVATIONS: Moderate  fascial restrictions noted in anterior shoulder girdle and subscapularis   TODAY'S TREATMENT:                                                                                                                              DATE:   03/14/23 -Shoulder strengthening: 2lb dumbbells, flexion, abduction, protraction, horizontal abduction, er/IR, x15 -Theraband Exercises: supine, Band Pulls (belly button level, chest level, forehead level), Diagonal Pulls, Abduction Pull from feet, Divers flexion pull, x10  -Prone exercises: 2lb dumbbells, scapula drill (extension, horizontal abduction, flexion), rows, superman in streamline, x10 -Latissimus stretch, 4x30" -Diagonal lifts, 2lb dumbbell, x10 -Proximal Shoulder Exercises: 2lbs, paddles, criss cross, circles both directions, x20  03/11/23 -Ball Rolls on the Wall: flexion, abduction, x10 -Therapy Ball Exercises: protraction, overhead press, flexion, V ups, circles both directions, x12 -Loop Band Exercises: Wall Taps, Wall Clocks, Wall V's, x12, green band -Latissimus stretch, 4x30" switching hands on top -Overhead lacing  03/10/23 -A/ROM: seated, 1lb, flexion, abduction, protraction, horizontal abduction, er/IR, x12 -Proximal Shoulder Exercises: 2lbs, paddles, criss cross, circles  both directions, x20 -Ball on the wall: ABC's -Scapular Strengthening: green band, extension, retraction, protraction, rows, x15 -Shoulder Strengthening: green band, flexion, abduction, horizontal abduction, er/IR, x10 -UBE: level 2, 3.5 KPH+, 2.5 min forwards and backwards   PATIENT EDUCATION: Education details: Public relations account executive with dumbbells Person educated: Patient Education method: Explanation, Demonstration, and Handouts Education comprehension: verbalized understanding and returned demonstration  HOME EXERCISE PROGRAM: 5/10: Table Slides and Wall Slides 5/17: AA/ROM and A/ROM 5/30: Scapular Strengthening 5/31: Loop Band Exercises 6/3: Shoulder  Strengthening with dumbbells  GOALS: Goals reviewed with patient? Yes  SHORT TERM GOALS: Target date: 03/25/23  Pt will be provided and educated on an HEP for improved RUE mobility and strength for ADL completion.   Goal status: IN PROGRESS  2.  Pt will decrease RUE pain to 3/10 in order to sleep 3+ consecutive hours without waking due to pain.   Goal status: IN PROGRESS  3.  Pt will decrease fascial restrictions in RUE to minimal amounts or less to complete overhead reaching tasks.    Goal status: IN PROGRESS  4.  Pt will increase RUE ROM to [facility] in order to complete dressing and bathing tasks without compensatory strategies for overhead and behind the back reaching.   Goal status:  IN PROGRESS  5.  Pt will increase RUE strength to 5/5 in order to lift and carry objects when playing and taking care of her children.   Goal status: IN PROGRESS   ASSESSMENT:  CLINICAL IMPRESSION: Pt continuing to have discomfort in her scapular region, however this session it did not limit her ROM. OT added higher level scapular strengthening exercises both with the theraband and with dumbbells. She required 2 short rest breaks during the session due to muscle fatigue and was able to recover quickly. Verbal and visual cuing provided for positioning and technique throughout session.   PERFORMANCE DEFICITS: in functional skills including ADLs, IADLs, edema, tone, ROM, strength, pain, fascial restrictions, Fine motor control, Gross motor control, body mechanics, and UE functional use.   PLAN:  OT FREQUENCY: 2x/week  OT DURATION: 4 weeks  PLANNED INTERVENTIONS: self care/ADL training, therapeutic exercise, therapeutic activity, manual therapy, passive range of motion, functional mobility training, electrical stimulation, ultrasound, moist heat, cryotherapy, patient/family education, coping strategies training, and DME and/or AE instructions  RECOMMENDED OTHER SERVICES: N/A  CONSULTED AND AGREED  WITH PLAN OF CARE: Patient  PLAN FOR NEXT SESSION: Manual Therapy, P/ROM, AA/ROM, Proximal Shoulder Exercises, A/ROM, scapular Strengthening (trial exercises from swimmers shoulder program)   Mouhamed Glassco Bing Plume, OTR/L [facility] [phone] Lataysha Vohra Rosemarie Beath, OT 03/14/2023, 4:10 PM

## 2023-03-16 ENCOUNTER — Ambulatory Visit (HOSPITAL_COMMUNITY): Payer: BC Managed Care – PPO | Admitting: Occupational Therapy

## 2023-03-16 ENCOUNTER — Encounter (HOSPITAL_COMMUNITY): Payer: Self-pay | Admitting: Occupational Therapy

## 2023-03-16 DIAGNOSIS — R29898 Other symptoms and signs involving the musculoskeletal system: Secondary | ICD-10-CM

## 2023-03-16 DIAGNOSIS — M25611 Stiffness of right shoulder, not elsewhere classified: Secondary | ICD-10-CM

## 2023-03-16 DIAGNOSIS — M25511 Pain in right shoulder: Secondary | ICD-10-CM

## 2023-03-16 NOTE — Therapy (Signed)
OUTPATIENT OCCUPATIONAL THERAPY ORTHO TREATMENT NOTE  Patient Name: Lindsey [name] MRN: [id number] DOB:[birth year], 37 y.o., female Today's Date: 03/16/2023  PCP: Philip Aspen, Limmie Patricia, MD REFERRING PROVIDER: Philip Aspen, Limmie Patricia, MD  END OF SESSION:  OT End of Session - 03/16/23 1224     Visit Number 7    Number of Visits 9    Date for OT Re-Evaluation 03/25/23    Authorization Type BCBS    OT Start Time 0903    OT Stop Time 0951    OT Time Calculation (min) 48 min    Activity Tolerance Patient tolerated treatment well    Behavior During Therapy [facility] for tasks assessed/performed             Past Medical History:  Diagnosis Date   Diabetes mellitus    Diabetes mellitus affecting pregnancy in first trimester 03/16/2016   Hypothyroidism, adult 08/30/2019   Pregnant 03/16/2016   Supervision of other high-risk pregnancy 03/31/2016    [facility] at   8+5 weeks FOB  Hassel Neth 37 yo wm second child Dating By  LMP and [place] Pap  03/31/16 GC/CT Initial:                36+wks: Genetic Screen NT/IT:  CF screen  Anatomic [place]  Flu vaccine  Tdap Recommended ~ 28wks Glucose Screen  2 hr GBS  Feed Preference  Contraception  Circumcision  Childbirth Classes  Pediatrician     Past Surgical History:  Procedure Laterality Date   CESAREAN SECTION N/A 10/29/2016   Procedure: PRIMARY CESAREAN SECTION;  Surgeon: Lazaro Arms, MD;  Location: [facility] BIRTHING SUITES;  Service: Obstetrics;  Laterality: N/A;   NO PAST SURGERIES     Patient Active Problem List   Diagnosis Date Noted   Complex cyst of left ovary 09/01/2022   Abdominal pain, epigastric 08/05/2022   Nausea and vomiting 08/05/2022   Pelvic pain 06/28/2022   Pregnancy examination or test, negative result 06/28/2022   Irregular periods 06/28/2022   Dyspareunia, female 06/28/2022   Hyperlipidemia associated with type 2 diabetes mellitus (HCC) 08/12/2021   Well woman exam with routine gynecological exam  07/18/2020   Hypothyroidism, adult 08/30/2019   Acne 10/21/2017   Insulin pump status 08/17/2012   Hypoglycemia associated with diabetes (HCC) 08/17/2012   Diabetes mellitus type I (HCC) 08/27/2011    ONSET DATE: Sep 2023  REFERRING DIAG: R shoulder pain  THERAPY DIAG:  Acute pain of right shoulder  Stiffness of right shoulder, not elsewhere classified  Other symptoms and signs involving the musculoskeletal system  Rationale for Evaluation and Treatment: Rehabilitation  SUBJECTIVE:   SUBJECTIVE STATEMENT: "I feel it sometimes" Pt accompanied by: self  PERTINENT HISTORY: No significant PMH findings. Pt has reported sporadic shoulder pain for ~3 years, which has been worsening in the past 6 months.   PRECAUTIONS: None  WEIGHT BEARING RESTRICTIONS: No  PAIN:  Are you having pain?  None at rest - 5 with movement, if hitting the wrong spot  FALLS: Has patient fallen in last 6 months? No  LIVING ENVIRONMENT: Lives with: lives with their family Lives in: House/apartment  PLOF: Independent  PATIENT GOALS: To reduce pain and full range of motion  NEXT MD VISIT: none  OBJECTIVE:   HAND DOMINANCE: Right  ADLs: Overall ADLs: Pt is having to use moderate compensatory strategies for ADL's where she does not have to lift her arm to complete them. Limited in IADL's as far as  lifting.   FUNCTIONAL OUTCOME MEASURES: FOTO: 60.08/100  UPPER EXTREMITY ROM:     Active ROM Right eval  Shoulder flexion 121  Shoulder abduction 110  Shoulder internal rotation 90  Shoulder external rotation 20  (Blank rows = not tested)  UPPER EXTREMITY MMT:     MMT Right eval  Shoulder flexion 4+/5  Shoulder abduction 4/5  Shoulder adduction 4/5  Shoulder extension 4+/5  Shoulder internal rotation 4+/5  Shoulder external rotation 4+/5  (Blank rows = not tested)  SENSATION: WFL  EDEMA: no swelling noted  OBSERVATIONS: Moderate fascial restrictions noted in anterior shoulder  girdle and subscapularis   TODAY'S TREATMENT:                                                                                                                              DATE:   03/16/23 -Shoulder strengthening: 2lb dumbbells, flexion, abduction, protraction, horizontal abduction, er/IR, x15 -PNF Strengthening: green band, chest pulls, er pulls, PNF up, PNF down, x10 -Body Blade: flexion in horizontal, abduction in horizontal, Scaption w/ movement, x45" -Shoulder Strengthening: green bands, flexion, abduction, er, IR, horizontal abduction, x12 -Rebounder: [employer], x15  03/14/23 -Shoulder strengthening: 2lb dumbbells, flexion, abduction, protraction, horizontal abduction, er/IR, x15 -Theraband Exercises: supine, Band Pulls (belly button level, chest level, forehead level), Diagonal Pulls, Abduction Pull from feet, Divers flexion pull, x10  -Prone exercises: 2lb dumbbells, scapula drill (extension, horizontal abduction, flexion), rows, superman in streamline, x10 -Latissimus stretch, 4x30" -Diagonal lifts, 2lb dumbbell, x10 -Proximal Shoulder Exercises: 2lbs, paddles, criss cross, circles both directions, x20  03/11/23 -Ball Rolls on the Wall: flexion, abduction, x10 -Therapy Ball Exercises: protraction, overhead press, flexion, V ups, circles both directions, x12 -Loop Band Exercises: Wall Taps, Wall Clocks, Wall V's, x12, green band -Latissimus stretch, 4x30" switching hands on top -Overhead lacing   PATIENT EDUCATION: Education details: Continue HEP Person educated: Patient Education method: Programmer, multimedia, Demonstration, and Handouts Education comprehension: verbalized understanding and returned demonstration  HOME EXERCISE PROGRAM: 5/10: Table Slides and Wall Slides 5/17: AA/ROM and A/ROM 5/30: Scapular Strengthening 5/31: Loop Band Exercises 6/3: Shoulder Strengthening with dumbbells  GOALS: Goals reviewed with patient? Yes  SHORT TERM GOALS: Target date:  03/25/23  Pt will be provided and educated on an HEP for improved RUE mobility and strength for ADL completion.   Goal status: IN PROGRESS  2.  Pt will decrease RUE pain to 3/10 in order to sleep 3+ consecutive hours without waking due to pain.   Goal status: IN PROGRESS  3.  Pt will decrease fascial restrictions in RUE to minimal amounts or less to complete overhead reaching tasks.    Goal status: IN PROGRESS  4.  Pt will increase RUE ROM to [facility] in order to complete dressing and bathing tasks without compensatory strategies for overhead and behind the back reaching.   Goal status: IN PROGRESS  5.  Pt will increase RUE strength to 5/5 in order to lift  and carry objects when playing and taking care of her children.   Goal status: IN PROGRESS   ASSESSMENT:  CLINICAL IMPRESSION: This session pt presenting with continued decreased pain in scapula. She continues to improve shoulder strength as she was able to progress to 3lb dumbbells and maintain green bands with no rest breaks required. Additionally OT added body blade exercises, which she tolerated well with mild muscle fatigue. Verbal and visual cuing provided for positioning and technique with all exercises.   PERFORMANCE DEFICITS: in functional skills including ADLs, IADLs, edema, tone, ROM, strength, pain, fascial restrictions, Fine motor control, Gross motor control, body mechanics, and UE functional use.   PLAN:  OT FREQUENCY: 2x/week  OT DURATION: 4 weeks  PLANNED INTERVENTIONS: self care/ADL training, therapeutic exercise, therapeutic activity, manual therapy, passive range of motion, functional mobility training, electrical stimulation, ultrasound, moist heat, cryotherapy, patient/family education, coping strategies training, and DME and/or AE instructions  RECOMMENDED OTHER SERVICES: N/A  CONSULTED AND AGREED WITH PLAN OF CARE: Patient  PLAN FOR NEXT SESSION: Manual Therapy, P/ROM, AA/ROM, Proximal Shoulder  Exercises, A/ROM, scapular Strengthening (trial exercises from swimmers shoulder program)   Marti Acebo Bing Plume, OTR/L [facility] [phone] Aniyiah Zell Rosemarie Beath, OT 03/16/2023, 12:29 PM

## 2023-03-31 ENCOUNTER — Ambulatory Visit (HOSPITAL_COMMUNITY): Payer: BC Managed Care – PPO | Admitting: Occupational Therapy

## 2023-03-31 ENCOUNTER — Encounter (HOSPITAL_COMMUNITY): Payer: Self-pay | Admitting: Occupational Therapy

## 2023-03-31 DIAGNOSIS — M25611 Stiffness of right shoulder, not elsewhere classified: Secondary | ICD-10-CM | POA: Diagnosis not present

## 2023-03-31 DIAGNOSIS — R29898 Other symptoms and signs involving the musculoskeletal system: Secondary | ICD-10-CM | POA: Diagnosis not present

## 2023-03-31 DIAGNOSIS — M25511 Pain in right shoulder: Secondary | ICD-10-CM | POA: Diagnosis not present

## 2023-03-31 NOTE — Therapy (Signed)
OUTPATIENT OCCUPATIONAL THERAPY ORTHO TREATMENT NOTE AND DISCHARGE NOTE  Patient Name: Lindsey [name] MRN: [id number] DOB:[birth year], 37 y.o., female Today's Date: 03/31/2023  PCP: Philip Aspen, Limmie Patricia, MD REFERRING PROVIDER: Philip Aspen, Limmie Patricia, MD  OCCUPATIONAL THERAPY DISCHARGE SUMMARY  Visits from Start of Care: 8  Current functional level related to goals / functional outcomes: Pt has met 5 out of 5 OT goals. She has been provided a comprehensive HEP, pain is trace if any most days, Her ROM and strength are [facility] and she feels like she has no remaining deficits at this time.    Remaining deficits: Pt has no deficits at this time, she has met all OT goals.    Education / Equipment: Pt was provided a comprehensive HEP and theraband's for continued progression at home with strength and mobility.    Plan: Patient agrees to discharge as she has met all OT goals.      END OF SESSION:  OT End of Session - 03/31/23 1008     Visit Number 8    Number of Visits 9    Date for OT Re-Evaluation 03/25/23    Authorization Type BCBS    OT Start Time 0903    OT Stop Time 0942    OT Time Calculation (min) 39 min    Activity Tolerance Patient tolerated treatment well    Behavior During Therapy [facility] for tasks assessed/performed              Past Medical History:  Diagnosis Date   Diabetes mellitus    Diabetes mellitus affecting pregnancy in first trimester 03/16/2016   Hypothyroidism, adult 08/30/2019   Pregnant 03/16/2016   Supervision of other high-risk pregnancy 03/31/2016    [facility] at   8+5 weeks FOB  Hassel Neth 37 yo wm second child Dating By  LMP and [place] Pap  03/31/16 GC/CT Initial:                36+wks: Genetic Screen NT/IT:  CF screen  Anatomic [place]  Flu vaccine  Tdap Recommended ~ 28wks Glucose Screen  2 hr GBS  Feed Preference  Contraception  Circumcision  Childbirth Classes  Pediatrician     Past Surgical History:  Procedure  Laterality Date   CESAREAN SECTION N/A 10/29/2016   Procedure: PRIMARY CESAREAN SECTION;  Surgeon: Lazaro Arms, MD;  Location: [facility] BIRTHING SUITES;  Service: Obstetrics;  Laterality: N/A;   NO PAST SURGERIES     Patient Active Problem List   Diagnosis Date Noted   Complex cyst of left ovary 09/01/2022   Abdominal pain, epigastric 08/05/2022   Nausea and vomiting 08/05/2022   Pelvic pain 06/28/2022   Pregnancy examination or test, negative result 06/28/2022   Irregular periods 06/28/2022   Dyspareunia, female 06/28/2022   Hyperlipidemia associated with type 2 diabetes mellitus (HCC) 08/12/2021   Well woman exam with routine gynecological exam 07/18/2020   Hypothyroidism, adult 08/30/2019   Acne 10/21/2017   Insulin pump status 08/17/2012   Hypoglycemia associated with diabetes (HCC) 08/17/2012   Diabetes mellitus type I (HCC) 08/27/2011    ONSET DATE: Sep 2023  REFERRING DIAG: R shoulder pain  THERAPY DIAG:  Acute pain of right shoulder  Stiffness of right shoulder, not elsewhere classified  Other symptoms and signs involving the musculoskeletal system  Rationale for Evaluation and Treatment: Rehabilitation  SUBJECTIVE:   SUBJECTIVE STATEMENT: "Now I know I can work through it an it will loosen up." Pt accompanied  by: self  PERTINENT HISTORY: No significant PMH findings. Pt has reported sporadic shoulder pain for ~3 years, which has been worsening in the past 6 months.   PRECAUTIONS: None  WEIGHT BEARING RESTRICTIONS: No  PAIN:  Are you having pain? No  FALLS: Has patient fallen in last 6 months? No  LIVING ENVIRONMENT: Lives with: lives with their family Lives in: House/apartment  PLOF: Independent  PATIENT GOALS: To reduce pain and full range of motion  NEXT MD VISIT: none  OBJECTIVE:   HAND DOMINANCE: Right  ADLs: Overall ADLs: Pt is having to use moderate compensatory strategies for ADL's where she does not have to lift her arm to complete them.  Limited in IADL's as far as lifting.   FUNCTIONAL OUTCOME MEASURES: FOTO: 60.08/100 03/31/23: 79.76/100  UPPER EXTREMITY ROM:     Active ROM Right eval Right 03/31/23  Shoulder flexion 121 165  Shoulder abduction 110 166  Shoulder internal rotation 90 90  Shoulder external rotation 20 64  (Blank rows = not tested)  UPPER EXTREMITY MMT:     MMT Right eval Right 03/31/23  Shoulder flexion 4+/5 5/5  Shoulder abduction 4/5 5/5  Shoulder adduction 4/5 5/5  Shoulder extension 4+/5 5/5  Shoulder internal rotation 4+/5 5/5  Shoulder external rotation 4+/5 5/5  (Blank rows = not tested)  SENSATION: WFL  EDEMA: no swelling noted  OBSERVATIONS: Moderate fascial restrictions noted in anterior shoulder girdle and subscapularis   TODAY'S TREATMENT:                                                                                                                              DATE:   03/31/23 -Shoulder strengthening: 2lb dumbbells, flexion, abduction, protraction, horizontal abduction, er/IR, x12 -Theraband: abduction, scaption, chest pulls @ head height and chest height, er pulls, X pulls, x10 -Measurements for reassessment  -Scapular Strengthening: 3lb dumbbells, bent over rows, upright rows, extension, x10 -Loop Band Exercises: Wall Taps, Wall Clocks, Wall V's, x12, green band  03/16/23 -Shoulder strengthening: 2lb dumbbells, flexion, abduction, protraction, horizontal abduction, er/IR, x15 -PNF Strengthening: green band, chest pulls, er pulls, PNF up, PNF down, x10 -Body Blade: flexion in horizontal, abduction in horizontal, Scaption w/ movement, x45" -Shoulder Strengthening: green bands, flexion, abduction, er, IR, horizontal abduction, x12 -Rebounder: [employer], x15  03/14/23 -Shoulder strengthening: 2lb dumbbells, flexion, abduction, protraction, horizontal abduction, er/IR, x15 -Theraband Exercises: supine, Band Pulls (belly button level, chest level, forehead level),  Diagonal Pulls, Abduction Pull from feet, Divers flexion pull, x10  -Prone exercises: 2lb dumbbells, scapula drill (extension, horizontal abduction, flexion), rows, superman in streamline, x10 -Latissimus stretch, 4x30" -Diagonal lifts, 2lb dumbbell, x10 -Proximal Shoulder Exercises: 2lbs, paddles, criss cross, circles both directions, x20    PATIENT EDUCATION: Education details: Continue HEP Person educated: Patient Education method: Explanation, Demonstration, and Handouts Education comprehension: verbalized understanding and returned demonstration  HOME EXERCISE PROGRAM: 5/10: Table Slides and Wall Slides 5/17: AA/ROM and A/ROM 5/30:  Scapular Strengthening 5/31: Loop Band Exercises 6/3: Shoulder Strengthening with dumbbells  GOALS: Goals reviewed with patient? Yes  SHORT TERM GOALS: Target date: 03/25/23  Pt will be provided and educated on an HEP for improved RUE mobility and strength for ADL completion.   Goal status: MET  2.  Pt will decrease RUE pain to 3/10 in order to sleep 3+ consecutive hours without waking due to pain.   Goal status: MET  3.  Pt will decrease fascial restrictions in RUE to minimal amounts or less to complete overhead reaching tasks.    Goal status: MET  4.  Pt will increase RUE ROM to [facility] in order to complete dressing and bathing tasks without compensatory strategies for overhead and behind the back reaching.   Goal status: MET  5.  Pt will increase RUE strength to 5/5 in order to lift and carry objects when playing and taking care of her children.   Goal status: MET   ASSESSMENT:  CLINICAL IMPRESSION: Pt was reassessed this session. She has met all of her OT goals and is demonstrating improved ROM and strength. This session OT and pt reviewed different scapula strengthening exercises and overall shoulder strengthening exercises. Pt has no futher skilled OT needs and will be discharged from OT.   PERFORMANCE DEFICITS: in functional  skills including ADLs, IADLs, edema, tone, ROM, strength, pain, fascial restrictions, Fine motor control, Gross motor control, body mechanics, and UE functional use.   PLAN:  OT FREQUENCY: 2x/week  OT DURATION: 4 weeks  PLANNED INTERVENTIONS: self care/ADL training, therapeutic exercise, therapeutic activity, manual therapy, passive range of motion, functional mobility training, electrical stimulation, ultrasound, moist heat, cryotherapy, patient/family education, coping strategies training, and DME and/or AE instructions  RECOMMENDED OTHER SERVICES: N/A  CONSULTED AND AGREED WITH PLAN OF CARE: Patient  PLAN FOR NEXT SESSION: Discharge   Diana Armijo Bing Plume, OTR/L [facility] [phone] Ellissa Ayo Rosemarie Beath, OT 03/31/2023, 10:10 AM

## 2023-04-05 ENCOUNTER — Encounter (HOSPITAL_COMMUNITY): Payer: BC Managed Care – PPO | Admitting: Occupational Therapy

## 2023-04-12 ENCOUNTER — Other Ambulatory Visit: Payer: Self-pay | Admitting: Internal Medicine

## 2023-04-12 DIAGNOSIS — E039 Hypothyroidism, unspecified: Secondary | ICD-10-CM

## 2023-04-12 DIAGNOSIS — E109 Type 1 diabetes mellitus without complications: Secondary | ICD-10-CM

## 2023-04-13 ENCOUNTER — Other Ambulatory Visit (HOSPITAL_COMMUNITY): Payer: Self-pay

## 2023-04-15 ENCOUNTER — Other Ambulatory Visit (HOSPITAL_COMMUNITY): Payer: Self-pay

## 2023-04-28 ENCOUNTER — Other Ambulatory Visit: Payer: Self-pay | Admitting: Internal Medicine

## 2023-04-28 DIAGNOSIS — E109 Type 1 diabetes mellitus without complications: Secondary | ICD-10-CM

## 2023-04-28 DIAGNOSIS — E039 Hypothyroidism, unspecified: Secondary | ICD-10-CM

## 2023-04-28 DIAGNOSIS — E1169 Type 2 diabetes mellitus with other specified complication: Secondary | ICD-10-CM

## 2023-04-29 ENCOUNTER — Other Ambulatory Visit (HOSPITAL_COMMUNITY): Payer: Self-pay

## 2023-04-30 ENCOUNTER — Other Ambulatory Visit (HOSPITAL_COMMUNITY): Payer: Self-pay

## 2023-05-02 ENCOUNTER — Other Ambulatory Visit: Payer: Self-pay

## 2023-05-02 ENCOUNTER — Other Ambulatory Visit (HOSPITAL_COMMUNITY): Payer: Self-pay

## 2023-05-02 MED ORDER — NP THYROID 60 MG PO TABS
120.0000 mg | ORAL_TABLET | Freq: Every day | ORAL | 0 refills | Status: DC
  Filled 2023-05-02: qty 180, 90d supply, fill #0

## 2023-05-02 MED ORDER — FREESTYLE LIBRE 3 SENSOR MISC
1.0000 | 2 refills | Status: DC
  Filled 2023-05-02: qty 2, 28d supply, fill #0
  Filled 2023-06-01: qty 2, 28d supply, fill #1

## 2023-05-02 MED ORDER — ATORVASTATIN CALCIUM 40 MG PO TABS
40.0000 mg | ORAL_TABLET | Freq: Every day | ORAL | 0 refills | Status: DC
  Filled 2023-05-02: qty 90, 90d supply, fill #0

## 2023-05-03 ENCOUNTER — Other Ambulatory Visit (HOSPITAL_COMMUNITY): Payer: Self-pay

## 2023-05-04 ENCOUNTER — Other Ambulatory Visit (HOSPITAL_COMMUNITY): Payer: Self-pay

## 2023-05-04 ENCOUNTER — Other Ambulatory Visit: Payer: Self-pay

## 2023-05-06 ENCOUNTER — Other Ambulatory Visit: Payer: Self-pay

## 2023-05-10 ENCOUNTER — Encounter: Payer: Self-pay | Admitting: Internal Medicine

## 2023-05-10 ENCOUNTER — Ambulatory Visit: Payer: BC Managed Care – PPO | Admitting: Internal Medicine

## 2023-05-10 VITALS — BP 120/84 | HR 76 | Temp 98.1°F | Wt 188.0 lb

## 2023-05-10 DIAGNOSIS — E109 Type 1 diabetes mellitus without complications: Secondary | ICD-10-CM | POA: Diagnosis not present

## 2023-05-10 DIAGNOSIS — R5383 Other fatigue: Secondary | ICD-10-CM

## 2023-05-10 DIAGNOSIS — Z9641 Presence of insulin pump (external) (internal): Secondary | ICD-10-CM

## 2023-05-10 DIAGNOSIS — E1169 Type 2 diabetes mellitus with other specified complication: Secondary | ICD-10-CM

## 2023-05-10 DIAGNOSIS — E039 Hypothyroidism, unspecified: Secondary | ICD-10-CM | POA: Diagnosis not present

## 2023-05-10 DIAGNOSIS — E785 Hyperlipidemia, unspecified: Secondary | ICD-10-CM

## 2023-05-10 LAB — COMPREHENSIVE METABOLIC PANEL
ALT: 18 U/L (ref 0–35)
AST: 17 U/L (ref 0–37)
Albumin: 4.3 g/dL (ref 3.5–5.2)
Alkaline Phosphatase: 48 U/L (ref 39–117)
BUN: 14 mg/dL (ref 6–23)
CO2: 27 mEq/L (ref 19–32)
Calcium: 9.7 mg/dL (ref 8.4–10.5)
Chloride: 100 mEq/L (ref 96–112)
Creatinine, Ser: 0.89 mg/dL (ref 0.40–1.20)
GFR: 83.43 mL/min (ref >60.00)
Glucose, Bld: 119 mg/dL — ABNORMAL HIGH (ref 70–99)
Potassium: 3.7 mEq/L (ref 3.5–5.1)
Sodium: 136 mEq/L (ref 135–145)
Total Bilirubin: 0.6 mg/dL (ref 0.2–1.2)
Total Protein: 7.3 g/dL (ref 6.0–8.3)

## 2023-05-10 LAB — LIPID PANEL
Cholesterol: 214 mg/dL — ABNORMAL HIGH (ref 0–200)
HDL: 59.3 mg/dL (ref >39.00)
LDL Cholesterol: 134 mg/dL — ABNORMAL HIGH (ref 0–99)
NonHDL: 154.48
Total CHOL/HDL Ratio: 4
Triglycerides: 102 mg/dL (ref 0.0–149.0)
VLDL: 20.4 mg/dL (ref 0.0–40.0)

## 2023-05-10 LAB — CBC WITH DIFFERENTIAL/PLATELET
Basophils Absolute: 0 10*3/uL (ref 0.0–0.1)
Basophils Relative: 0.2 % (ref 0.0–3.0)
Eosinophils Absolute: 0.1 10*3/uL (ref 0.0–0.7)
Eosinophils Relative: 1.8 % (ref 0.0–5.0)
HCT: 44.3 % (ref 36.0–46.0)
Hemoglobin: 14.4 g/dL (ref 12.0–15.0)
Lymphocytes Relative: 18.1 % (ref 12.0–46.0)
Lymphs Abs: 1.3 10*3/uL (ref 0.7–4.0)
MCHC: 32.5 g/dL (ref 30.0–36.0)
MCV: 89.1 fl (ref 78.0–100.0)
Monocytes Absolute: 0.5 10*3/uL (ref 0.1–1.0)
Monocytes Relative: 6.9 % (ref 3.0–12.0)
Neutro Abs: 5.4 10*3/uL (ref 1.4–7.7)
Neutrophils Relative %: 73 % (ref 43.0–77.0)
Platelets: 381 10*3/uL (ref 150.0–400.0)
RBC: 4.97 Mil/uL (ref 3.87–5.11)
RDW: 12.7 % (ref 11.5–15.5)
WBC: 7.4 10*3/uL (ref 4.0–10.5)

## 2023-05-10 LAB — POCT GLYCOSYLATED HEMOGLOBIN (HGB A1C): Hemoglobin A1C: 7.2 % — AB (ref 4.0–5.6)

## 2023-05-10 LAB — MICROALBUMIN / CREATININE URINE RATIO
Creatinine,U: 46.6 mg/dL
Microalb Creat Ratio: 1.5 mg/g (ref 0.0–30.0)
Microalb, Ur: <0.7 mg/dL (ref 0.0–1.9)

## 2023-05-10 LAB — VITAMIN D 25 HYDROXY (VIT D DEFICIENCY, FRACTURES): VITD: 54.91 ng/mL (ref 30.00–100.00)

## 2023-05-10 LAB — TSH: TSH: 8.89 u[IU]/mL — ABNORMAL HIGH (ref 0.35–5.50)

## 2023-05-10 LAB — VITAMIN B12: Vitamin B-12: 258 pg/mL (ref 211–911)

## 2023-05-10 NOTE — Progress Notes (Signed)
Established Patient Office Visit     CC/Reason for Visit: Follow-up chronic conditions  HPI: Lindsey [name] is a 37 y.o. female who is coming in today for the above mentioned reasons. Past Medical History is significant for: Insulin-dependent diabetes with a pump, hypothyroidism and hyperlipidemia.  She is feeling well and has no acute concerns or complaints.   Past Medical/Surgical History: Past Medical History:  Diagnosis Date   Diabetes mellitus    Diabetes mellitus affecting pregnancy in first trimester 03/16/2016   Hypothyroidism, adult 08/30/2019   Pregnant 03/16/2016   Supervision of other high-risk pregnancy 03/31/2016    [facility] at   8+5 weeks FOB  Young Olszewski 37 yo wm second child Dating By  LMP and [place] Pap  03/31/16 GC/CT Initial:                36+wks: Genetic Screen NT/IT:  CF screen  Anatomic [place]  Flu vaccine  Tdap Recommended ~ 28wks Glucose Screen  2 hr GBS  Feed Preference  Contraception  Circumcision  Childbirth Classes  Pediatrician      Past Surgical History:  Procedure Laterality Date   CESAREAN SECTION N/A 10/29/2016   Procedure: PRIMARY CESAREAN SECTION;  Surgeon: Lazaro Arms, MD;  Location: [facility] BIRTHING SUIT[facility];  Service: Obstetrics;  Laterality: N/A;   NO PAST SURGERIES      Social History:  reports that she has never smoked. She has never used smokeless tobacco. She reports current alcohol use of about 2.0 standard drinks of alcohol per week. She reports that she does not use drugs.  Allergies: No Known Allergies  Family History:  Family History  Problem Relation Age of Onset   Hypertension Father    Cancer Maternal Grandmother    Cancer Maternal Grandfather    Alzheimer's disease Paternal Grandfather      Current Outpatient Medications:    atorvastatin (LIPITOR) 40 MG tablet, Take 1 tablet (40 mg total) by mouth daily., Disp: 90 tablet, Rfl: 0   cetirizine (ZYRTEC) 10 MG tablet, Take 10 mg by mouth daily., Disp: ,  Rfl:    Continuous Glucose Sensor (FREESTYLE LIBRE 3 SENSOR) MISC, Place 1 sensor on the skin every 14 days as directed. Use to check glucose continuously, Disp: 2 each, Rfl: 2   insulin lispro (HUMALOG) 100 UNIT/ML injection, Inject 0.7 mLs (70 Units total) into the skin daily as directed in insulin pump., Disp: 60 mL, Rfl: 2   NP THYROID 60 MG tablet, Take 2 tablets (120 mg total) by mouth daily., Disp: 180 tablet, Rfl: 0   spironolactone (ALDACTONE) 100 MG tablet, Take 1 tablet (100 mg total) by mouth daily., Disp: 90 tablet, Rfl: 1  Review of Systems:  Negative unless indicated in HPI.   Physical Exam: Vitals:   05/10/23 0831  BP: 120/84  Pulse: 76  Temp: 98.1 F (36.7 C)  TempSrc: Oral  SpO2: 99%  Weight: 188 lb (85.3 kg)    Body mass index is 32.27 kg/m.   Physical Exam Vitals reviewed.  Constitutional:      Appearance: Normal appearance.  HENT:     Head: Normocephalic and atraumatic.  Eyes:     Conjunctiva/sclera: Conjunctivae normal.     Pupils: Pupils are equal, round, and reactive to light.  Cardiovascular:     Rate and Rhythm: Normal rate and regular rhythm.  Pulmonary:     Effort: Pulmonary effort is normal.     Breath sounds: Normal breath  sounds.  Skin:    General: Skin is warm and dry.  Neurological:     General: No focal deficit present.     Mental Status: She is alert and oriented to person, place, and time.  Psychiatric:        Mood and Affect: Mood normal.        Behavior: Behavior normal.        Thought Content: Thought content normal.        Judgment: Judgment normal.      Impression and Plan:  Type 1 diabetes mellitus without complication (HCC) Assessment & Plan: She has an insulin pump.  A1c today is 7.2.  She also uses a freestyle libre.  Orders: -     POCT glycosylated hemoglobin (Hb A1C) -     Microalbumin / creatinine urine ratio; Future -     CBC with Differential/Platelet; Future -     Comprehensive metabolic panel;  Future  Hyperlipidemia associated with type 2 diabetes mellitus (HCC) Assessment & Plan: Check lipids today.  She is only taking atorvastatin 3 to 4 days a week on average.  Orders: -     Lipid panel; Future  Hypothyroidism, adult Assessment & Plan: Check TSH.  On NP thyroid.  Orders: -     TSH; Future  Insulin pump status  Fatigue, unspecified type -     Vitamin B12; Future -     VITAMIN D 25 Hydroxy (Vit-D Deficiency, Fractures); Future     Time spent:32 minutes reviewing chart, interviewing and examining patient and formulating plan of care.     Chaya Jan, MD [facility] at [facility]

## 2023-05-10 NOTE — Assessment & Plan Note (Signed)
She has an insulin pump.  A1c today is 7.2.  She also uses a freestyle libre.

## 2023-05-10 NOTE — Assessment & Plan Note (Signed)
Check TSH.  On NP thyroid.

## 2023-05-10 NOTE — Assessment & Plan Note (Signed)
Check lipids today.  She is only taking atorvastatin 3 to 4 days a week on average.

## 2023-05-11 ENCOUNTER — Other Ambulatory Visit: Payer: Self-pay | Admitting: Internal Medicine

## 2023-05-11 DIAGNOSIS — E039 Hypothyroidism, unspecified: Secondary | ICD-10-CM

## 2023-05-11 MED ORDER — NP THYROID 60 MG PO TABS
180.0000 mg | ORAL_TABLET | Freq: Every day | ORAL | Status: DC
Start: 2023-05-11 — End: 2023-05-18

## 2023-05-18 ENCOUNTER — Other Ambulatory Visit (HOSPITAL_COMMUNITY): Payer: Self-pay

## 2023-05-18 ENCOUNTER — Other Ambulatory Visit: Payer: Self-pay | Admitting: *Deleted

## 2023-05-18 DIAGNOSIS — E039 Hypothyroidism, unspecified: Secondary | ICD-10-CM

## 2023-05-18 DIAGNOSIS — E1169 Type 2 diabetes mellitus with other specified complication: Secondary | ICD-10-CM

## 2023-05-18 MED ORDER — NP THYROID 60 MG PO TABS
180.0000 mg | ORAL_TABLET | Freq: Every day | ORAL | 1 refills | Status: AC
Start: 2023-05-18 — End: ?
  Filled 2023-05-18: qty 270, fill #0
  Filled 2023-06-01 – 2023-08-17 (×7): qty 270, 90d supply, fill #0
  Filled 2023-11-11: qty 270, 90d supply, fill #1

## 2023-05-19 ENCOUNTER — Other Ambulatory Visit (HOSPITAL_COMMUNITY): Payer: Self-pay

## 2023-05-20 ENCOUNTER — Other Ambulatory Visit (HOSPITAL_COMMUNITY): Payer: Self-pay

## 2023-06-01 ENCOUNTER — Other Ambulatory Visit: Payer: Self-pay | Admitting: Internal Medicine

## 2023-06-01 DIAGNOSIS — L709 Acne, unspecified: Secondary | ICD-10-CM

## 2023-06-02 ENCOUNTER — Other Ambulatory Visit: Payer: Self-pay

## 2023-06-02 MED ORDER — SPIRONOLACTONE 100 MG PO TABS
100.0000 mg | ORAL_TABLET | Freq: Every day | ORAL | 1 refills | Status: DC
  Filled 2023-06-02: qty 90, 90d supply, fill #0
  Filled 2023-08-27: qty 90, 90d supply, fill #1

## 2023-06-18 ENCOUNTER — Encounter: Payer: Self-pay | Admitting: Internal Medicine

## 2023-06-18 DIAGNOSIS — E109 Type 1 diabetes mellitus without complications: Secondary | ICD-10-CM

## 2023-06-20 ENCOUNTER — Other Ambulatory Visit (HOSPITAL_COMMUNITY): Payer: Self-pay

## 2023-06-20 MED ORDER — FREESTYLE LIBRE 3 SENSOR MISC
1.0000 | 2 refills | Status: DC
Start: 2023-06-20 — End: 2023-10-01
  Filled 2023-06-20 – 2023-06-26 (×2): qty 2, 28d supply, fill #0
  Filled 2023-07-24: qty 2, 28d supply, fill #1
  Filled 2023-08-21: qty 2, 28d supply, fill #2

## 2023-06-22 ENCOUNTER — Other Ambulatory Visit (HOSPITAL_COMMUNITY): Payer: Self-pay

## 2023-06-27 ENCOUNTER — Other Ambulatory Visit: Payer: Self-pay

## 2023-07-18 ENCOUNTER — Other Ambulatory Visit: Payer: Self-pay

## 2023-07-18 ENCOUNTER — Other Ambulatory Visit (HOSPITAL_COMMUNITY): Payer: Self-pay

## 2023-07-21 ENCOUNTER — Other Ambulatory Visit: Payer: Self-pay

## 2023-07-23 ENCOUNTER — Other Ambulatory Visit: Payer: Self-pay | Admitting: Internal Medicine

## 2023-07-25 ENCOUNTER — Other Ambulatory Visit: Payer: Self-pay

## 2023-07-25 ENCOUNTER — Other Ambulatory Visit (HOSPITAL_COMMUNITY): Payer: Self-pay

## 2023-07-25 ENCOUNTER — Other Ambulatory Visit: Payer: Self-pay | Admitting: Internal Medicine

## 2023-07-25 DIAGNOSIS — E1169 Type 2 diabetes mellitus with other specified complication: Secondary | ICD-10-CM

## 2023-07-25 MED ORDER — INSULIN LISPRO 100 UNIT/ML IJ SOLN
70.0000 [IU] | Freq: Every day | INTRAMUSCULAR | 2 refills | Status: DC
Start: 2023-07-25 — End: 2023-12-27
  Filled 2023-07-25: qty 60, 86d supply, fill #0
  Filled 2023-10-19 – 2023-12-19 (×4): qty 60, 86d supply, fill #1

## 2023-07-26 ENCOUNTER — Other Ambulatory Visit (HOSPITAL_COMMUNITY): Payer: Self-pay

## 2023-07-26 ENCOUNTER — Other Ambulatory Visit: Payer: Self-pay

## 2023-07-26 MED ORDER — ATORVASTATIN CALCIUM 40 MG PO TABS
40.0000 mg | ORAL_TABLET | Freq: Every day | ORAL | 1 refills | Status: DC
Start: 2023-07-26 — End: 2024-01-18
  Filled 2023-07-26: qty 90, 90d supply, fill #0
  Filled 2023-10-25: qty 90, 90d supply, fill #1

## 2023-07-27 ENCOUNTER — Other Ambulatory Visit: Payer: Self-pay

## 2023-08-10 ENCOUNTER — Encounter: Payer: BC Managed Care – PPO | Admitting: Internal Medicine

## 2023-08-10 ENCOUNTER — Other Ambulatory Visit (HOSPITAL_COMMUNITY): Payer: Self-pay

## 2023-08-11 ENCOUNTER — Other Ambulatory Visit: Payer: Self-pay

## 2023-08-11 ENCOUNTER — Encounter: Payer: Self-pay | Admitting: Pharmacist

## 2023-08-15 ENCOUNTER — Other Ambulatory Visit: Payer: Self-pay

## 2023-08-17 ENCOUNTER — Other Ambulatory Visit (HOSPITAL_COMMUNITY): Payer: Self-pay

## 2023-08-17 ENCOUNTER — Other Ambulatory Visit: Payer: Self-pay

## 2023-08-22 ENCOUNTER — Other Ambulatory Visit: Payer: Self-pay

## 2023-08-27 ENCOUNTER — Other Ambulatory Visit (HOSPITAL_COMMUNITY): Payer: Self-pay

## 2023-10-01 ENCOUNTER — Other Ambulatory Visit: Payer: Self-pay | Admitting: Internal Medicine

## 2023-10-01 DIAGNOSIS — E109 Type 1 diabetes mellitus without complications: Secondary | ICD-10-CM

## 2023-10-01 DIAGNOSIS — L709 Acne, unspecified: Secondary | ICD-10-CM

## 2023-10-03 ENCOUNTER — Other Ambulatory Visit: Payer: Self-pay

## 2023-10-03 ENCOUNTER — Other Ambulatory Visit (HOSPITAL_COMMUNITY): Payer: Self-pay

## 2023-10-03 MED ORDER — FREESTYLE LIBRE 3 SENSOR MISC
1.0000 | 2 refills | Status: DC
  Filled 2023-10-03: qty 2, 28d supply, fill #0
  Filled 2023-10-27: qty 2, 28d supply, fill #1
  Filled 2023-11-24: qty 2, 28d supply, fill #2

## 2023-10-03 MED ORDER — SPIRONOLACTONE 100 MG PO TABS
100.0000 mg | ORAL_TABLET | Freq: Every day | ORAL | 0 refills | Status: DC
  Filled 2023-10-03 – 2023-11-27 (×2): qty 90, 90d supply, fill #0

## 2023-10-19 ENCOUNTER — Other Ambulatory Visit: Payer: Self-pay

## 2023-10-19 ENCOUNTER — Other Ambulatory Visit (HOSPITAL_COMMUNITY): Payer: Self-pay

## 2023-10-25 ENCOUNTER — Other Ambulatory Visit (HOSPITAL_COMMUNITY): Payer: Self-pay

## 2023-10-28 ENCOUNTER — Other Ambulatory Visit: Payer: Self-pay

## 2023-11-02 ENCOUNTER — Encounter: Payer: BC Managed Care – PPO | Admitting: Internal Medicine

## 2023-11-04 ENCOUNTER — Other Ambulatory Visit: Payer: Self-pay

## 2023-11-09 ENCOUNTER — Encounter: Payer: Self-pay | Admitting: Internal Medicine

## 2023-11-09 ENCOUNTER — Ambulatory Visit (INDEPENDENT_AMBULATORY_CARE_PROVIDER_SITE_OTHER): Payer: BC Managed Care – PPO | Admitting: Internal Medicine

## 2023-11-09 VITALS — BP 110/80 | HR 84 | Temp 98.0°F | Ht 65.0 in | Wt 186.9 lb

## 2023-11-09 DIAGNOSIS — R5383 Other fatigue: Secondary | ICD-10-CM

## 2023-11-09 DIAGNOSIS — E785 Hyperlipidemia, unspecified: Secondary | ICD-10-CM | POA: Diagnosis not present

## 2023-11-09 DIAGNOSIS — E559 Vitamin D deficiency, unspecified: Secondary | ICD-10-CM | POA: Diagnosis not present

## 2023-11-09 DIAGNOSIS — Z0000 Encounter for general adult medical examination without abnormal findings: Secondary | ICD-10-CM | POA: Diagnosis not present

## 2023-11-09 DIAGNOSIS — E039 Hypothyroidism, unspecified: Secondary | ICD-10-CM | POA: Diagnosis not present

## 2023-11-09 DIAGNOSIS — E109 Type 1 diabetes mellitus without complications: Secondary | ICD-10-CM

## 2023-11-09 DIAGNOSIS — E1169 Type 2 diabetes mellitus with other specified complication: Secondary | ICD-10-CM

## 2023-11-09 DIAGNOSIS — Z23 Encounter for immunization: Secondary | ICD-10-CM | POA: Diagnosis not present

## 2023-11-09 LAB — VITAMIN B12: Vitamin B-12: 291 pg/mL (ref 211–911)

## 2023-11-09 LAB — CBC WITH DIFFERENTIAL/PLATELET
Basophils Absolute: 0 10*3/uL (ref 0.0–0.1)
Basophils Relative: 0.5 % (ref 0.0–3.0)
Eosinophils Absolute: 0.2 10*3/uL (ref 0.0–0.7)
Eosinophils Relative: 2.8 % (ref 0.0–5.0)
HCT: 41.5 % (ref 36.0–46.0)
Hemoglobin: 13.9 g/dL (ref 12.0–15.0)
Lymphocytes Relative: 17.9 % (ref 12.0–46.0)
Lymphs Abs: 1.3 10*3/uL (ref 0.7–4.0)
MCHC: 33.6 g/dL (ref 30.0–36.0)
MCV: 86.3 fL (ref 78.0–100.0)
Monocytes Absolute: 0.5 10*3/uL (ref 0.1–1.0)
Monocytes Relative: 6.6 % (ref 3.0–12.0)
Neutro Abs: 5.1 10*3/uL (ref 1.4–7.7)
Neutrophils Relative %: 72.2 % (ref 43.0–77.0)
Platelets: 343 10*3/uL (ref 150.0–400.0)
RBC: 4.81 Mil/uL (ref 3.87–5.11)
RDW: 12.6 % (ref 11.5–15.5)
WBC: 7.1 10*3/uL (ref 4.0–10.5)

## 2023-11-09 LAB — COMPREHENSIVE METABOLIC PANEL
ALT: 23 U/L (ref 0–35)
AST: 15 U/L (ref 0–37)
Albumin: 4.2 g/dL (ref 3.5–5.2)
Alkaline Phosphatase: 57 U/L (ref 39–117)
BUN: 16 mg/dL (ref 6–23)
CO2: 28 meq/L (ref 19–32)
Calcium: 9.7 mg/dL (ref 8.4–10.5)
Chloride: 103 meq/L (ref 96–112)
Creatinine, Ser: 0.77 mg/dL (ref 0.40–1.20)
GFR: 98.92 mL/min (ref >60.00)
Glucose, Bld: 197 mg/dL — ABNORMAL HIGH (ref 70–99)
Potassium: 4 meq/L (ref 3.5–5.1)
Sodium: 137 meq/L (ref 135–145)
Total Bilirubin: 0.5 mg/dL (ref 0.2–1.2)
Total Protein: 6.6 g/dL (ref 6.0–8.3)

## 2023-11-09 LAB — LIPID PANEL
Cholesterol: 114 mg/dL (ref 0–200)
HDL: 47.1 mg/dL (ref >39.00)
LDL Cholesterol: 52 mg/dL (ref 0–99)
NonHDL: 67.34
Total CHOL/HDL Ratio: 2
Triglycerides: 77 mg/dL (ref 0.0–149.0)
VLDL: 15.4 mg/dL (ref 0.0–40.0)

## 2023-11-09 LAB — HEMOGLOBIN A1C: Hgb A1c MFr Bld: 8.3 % — ABNORMAL HIGH (ref 4.6–6.5)

## 2023-11-09 LAB — VITAMIN D 25 HYDROXY (VIT D DEFICIENCY, FRACTURES): VITD: 43.28 ng/mL (ref 30.00–100.00)

## 2023-11-09 LAB — MICROALBUMIN / CREATININE URINE RATIO
Creatinine,U: 40.3 mg/dL
Microalb Creat Ratio: 1.7 mg/g (ref 0.0–30.0)
Microalb, Ur: <0.7 mg/dL (ref 0.0–1.9)

## 2023-11-09 LAB — TSH: TSH: 0.09 u[IU]/mL — ABNORMAL LOW (ref 0.35–5.50)

## 2023-11-09 NOTE — Progress Notes (Signed)
Established Patient Office Visit     CC/Reason for Visit: Annual preventive exam  HPI: Lindsey [name] is a 38 y.o. female who is coming in today for the above mentioned reasons. Past Medical History is significant for: Insulin-dependent diabetes on an insulin pump, hyperlipidemia and hypothyroidism.  Is overdue for an eye exam.  Is feeling well.  Is due for COVID and flu vaccines.  Believes her Pap smear is up-to-date.   Past Medical/Surgical History: Past Medical History:  Diagnosis Date   Diabetes mellitus    Diabetes mellitus affecting pregnancy in first trimester 03/16/2016   Hypothyroidism, adult 08/30/2019   Pregnant 03/16/2016   Supervision of other high-risk pregnancy 03/31/2016    [facility] Care at   8+5 weeks FOB  Josepha Barbier 38 yo wm second child Dating By  LMP and [place] Pap  03/31/16 GC/CT Initial:                36+wks: Genetic Screen NT/IT:  CF screen  Anatomic [place]  Flu vaccine  Tdap Recommended ~ 28wks Glucose Screen  2 hr GBS  Feed Preference  Contraception  Circumcision  Childbirth Classes  Pediatrician      Past Surgical History:  Procedure Laterality Date   CESAREAN SECTION N/A 10/29/2016   Procedure: PRIMARY CESAREAN SECTION;  Surgeon: Lazaro Arms, MD;  Location: [facility] BIRTHING SUITES;  Service: Obstetrics;  Laterality: N/A;   NO PAST SURGERIES      Social History:  reports that she has never smoked. She has never used smokeless tobacco. She reports current alcohol use of about 2.0 standard drinks of alcohol per week. She reports that she does not use drugs.  Allergies: No Known Allergies  Family History:  Family History  Problem Relation Age of Onset   Hypertension Father    Cancer Maternal Grandmother    Cancer Maternal Grandfather    Alzheimer's disease Paternal Grandfather      Current Outpatient Medications:    atorvastatin (LIPITOR) 40 MG tablet, Take 1 tablet (40 mg total) by mouth daily., Disp: 90 tablet, Rfl: 1   cetirizine  (ZYRTEC) 10 MG tablet, Take 10 mg by mouth daily., Disp: , Rfl:    Continuous Glucose Sensor (FREESTYLE LIBRE 3 SENSOR) MISC, Place 1 sensor on the skin every 14 days as directed. Use to check glucose continuously, Disp: 2 each, Rfl: 2   insulin lispro (HUMALOG) 100 UNIT/ML injection, Inject 0.7 mLs (70 Units total) into the skin daily as directed in insulin pump., Disp: 60 mL, Rfl: 2   NP THYROID 60 MG tablet, Take 3 tablets (180 mg total) by mouth daily., Disp: 270 tablet, Rfl: 1   spironolactone (ALDACTONE) 100 MG tablet, Take 1 tablet (100 mg total) by mouth daily., Disp: 90 tablet, Rfl: 0  Review of Systems:  Negative unless indicated in HPI.   Physical Exam: Vitals:   11/09/23 0801  BP: 110/80  Pulse: 84  Temp: 98 F (36.7 C)  TempSrc: Oral  SpO2: 100%  Weight: 186 lb 14.4 oz (84.8 kg)  Height: 5\' 5"  (1.651 m)    Body mass index is 31.1 kg/m.   Physical Exam Vitals reviewed.  Constitutional:      General: She is not in acute distress.    Appearance: Normal appearance. She is not ill-appearing, toxic-appearing or diaphoretic.  HENT:     Head: Normocephalic.     Right Ear: Tympanic membrane, ear canal and external ear normal. There is no  impacted cerumen.     Left Ear: Tympanic membrane, ear canal and external ear normal. There is no impacted cerumen.     Nose: Nose normal.     Mouth/Throat:     Mouth: Mucous membranes are moist.     Pharynx: Oropharynx is clear. No oropharyngeal exudate or posterior oropharyngeal erythema.  Eyes:     General: No scleral icterus.       Right eye: No discharge.        Left eye: No discharge.     Conjunctiva/sclera: Conjunctivae normal.     Pupils: Pupils are equal, round, and reactive to light.  Neck:     Vascular: No carotid bruit.  Cardiovascular:     Rate and Rhythm: Normal rate and regular rhythm.     Pulses: Normal pulses.     Heart sounds: Normal heart sounds.  Pulmonary:     Effort: Pulmonary effort is normal. No  respiratory distress.     Breath sounds: Normal breath sounds.  Abdominal:     General: Abdomen is flat. Bowel sounds are normal.     Palpations: Abdomen is soft.  Musculoskeletal:        General: Normal range of motion.     Cervical back: Normal range of motion.  Skin:    General: Skin is warm and dry.  Neurological:     General: No focal deficit present.     Mental Status: She is alert and oriented to person, place, and time. Mental status is at baseline.  Psychiatric:        Mood and Affect: Mood normal.        Behavior: Behavior normal.        Thought Content: Thought content normal.        Judgment: Judgment normal.     Diabetic Foot Exam - Simple   Simple Foot Form Diabetic Foot exam was performed with the following findings: Yes 11/09/2023  8:33 AM  Visual Inspection No deformities, no ulcerations, no other skin breakdown bilaterally: Yes Sensation Testing Intact to touch and monofilament testing bilaterally: Yes Pulse Check Posterior Tibialis and Dorsalis pulse intact bilaterally: Yes Comments    Flowsheet Row Office Visit from 11/09/2023 in [facility] [place]  PHQ-9 Total Score 1        Impression and Plan:  Type 1 diabetes mellitus without complication (HCC) -     Hemoglobin A1c; Future -     CBC with Differential/Platelet; Future -     Comprehensive metabolic panel; Future  Hypothyroidism, adult -     TSH; Future  Hyperlipidemia associated with type 2 diabetes mellitus (HCC) -     Lipid panel; Future  Fatigue, unspecified type -     Vitamin B12; Future  Vitamin D deficiency -     VITAMIN D 25 Hydroxy (Vit-D Deficiency, Fractures); Future  Encounter for preventive health examination  Immunization due   -Recommend routine eye and dental care. -Healthy lifestyle discussed in detail. -Labs to be updated today. -Prostate cancer screening: N/A Health Maintenance  Topic Date Due   Flu Shot  05/12/2023   COVID-19 Vaccine  (4 - 2024-25 season) 06/12/2023   Eye exam for diabetics  11/09/2023*   Hemoglobin A1C  11/10/2023   Yearly kidney function blood test for diabetes  05/09/2024   Yearly kidney health urinalysis for diabetes  05/09/2024   Complete foot exam   11/08/2024   Pap with HPV screening  07/18/2025   DTaP/Tdap/Td vaccine (2 -  Td or Tdap) 10/31/2026   Pneumococcal Vaccination (3 of 3 - PPSV23 or PCV20) 12/23/2051   Hepatitis C Screening  Completed   HIV Screening  Completed   HPV Vaccine  Aged Out  *Topic was postponed. The date shown is not the original due date.     -Flu vaccine today, declines COVID vaccination. -Obtain records from GYN.    Chaya Jan, MD [facility] at [facility]

## 2023-11-09 NOTE — Addendum Note (Signed)
Addended by: Kern Reap B on: 11/09/2023 10:11 AM   Modules accepted: Orders

## 2023-11-11 ENCOUNTER — Other Ambulatory Visit: Payer: Self-pay

## 2023-11-14 ENCOUNTER — Other Ambulatory Visit (HOSPITAL_COMMUNITY): Payer: Self-pay

## 2023-11-14 ENCOUNTER — Other Ambulatory Visit: Payer: Self-pay

## 2023-11-16 ENCOUNTER — Other Ambulatory Visit (HOSPITAL_COMMUNITY): Payer: Self-pay

## 2023-11-16 ENCOUNTER — Other Ambulatory Visit: Payer: Self-pay | Admitting: Internal Medicine

## 2023-11-16 ENCOUNTER — Encounter: Payer: Self-pay | Admitting: Internal Medicine

## 2023-11-16 DIAGNOSIS — E039 Hypothyroidism, unspecified: Secondary | ICD-10-CM

## 2023-11-16 MED ORDER — NP THYROID 60 MG PO TABS
180.0000 mg | ORAL_TABLET | Freq: Every day | ORAL | 1 refills | Status: DC
  Filled 2023-11-16: qty 270, fill #0
  Filled 2024-02-10: qty 270, 90d supply, fill #0
  Filled 2024-05-09: qty 270, 90d supply, fill #1

## 2023-11-17 ENCOUNTER — Other Ambulatory Visit (HOSPITAL_COMMUNITY): Payer: Self-pay

## 2023-11-17 ENCOUNTER — Other Ambulatory Visit: Payer: Self-pay

## 2023-11-18 ENCOUNTER — Telehealth: Payer: Self-pay

## 2023-11-18 ENCOUNTER — Other Ambulatory Visit (HOSPITAL_COMMUNITY): Payer: Self-pay

## 2023-11-18 NOTE — Telephone Encounter (Signed)
 Pharmacy Patient Advocate Encounter   Received notification from CoverMyMeds that prior authorization for Insulin  lispro 100 Unit/ML injection (Humalog  vials) is required/requested.   Insurance verification completed.   The patient is insured through [facility] ADVANTAGE/RX ADVANCE .   Per test claim:  Novolog  vial is preferred by the insurance.  If suggested medication is appropriate, Please send in a new RX and discontinue this one. If not, please advise as to why it's not appropriate so that we may request a Prior Authorization. Please note, some preferred medications may still require a PA.  If the suggested medications have not been trialed and there are no contraindications to their use, the PA will not be submitted, as it will not be approved.

## 2023-11-20 ENCOUNTER — Encounter: Payer: Self-pay | Admitting: Internal Medicine

## 2023-11-23 ENCOUNTER — Other Ambulatory Visit: Payer: Self-pay

## 2023-11-24 ENCOUNTER — Other Ambulatory Visit (HOSPITAL_COMMUNITY): Payer: Self-pay

## 2023-11-25 ENCOUNTER — Other Ambulatory Visit: Payer: Self-pay

## 2023-11-25 ENCOUNTER — Encounter: Payer: Self-pay | Admitting: Pharmacist

## 2023-11-28 ENCOUNTER — Other Ambulatory Visit (HOSPITAL_COMMUNITY): Payer: Self-pay

## 2023-11-28 ENCOUNTER — Other Ambulatory Visit: Payer: Self-pay

## 2023-11-30 ENCOUNTER — Other Ambulatory Visit: Payer: Self-pay

## 2023-12-09 ENCOUNTER — Other Ambulatory Visit: Payer: Self-pay

## 2023-12-11 ENCOUNTER — Encounter: Payer: Self-pay | Admitting: Internal Medicine

## 2023-12-11 DIAGNOSIS — E109 Type 1 diabetes mellitus without complications: Secondary | ICD-10-CM

## 2023-12-12 ENCOUNTER — Other Ambulatory Visit: Payer: Self-pay

## 2023-12-12 ENCOUNTER — Other Ambulatory Visit (HOSPITAL_COMMUNITY): Payer: Self-pay

## 2023-12-12 MED ORDER — FREESTYLE LIBRE 3 SENSOR MISC
1.0000 | 2 refills | Status: DC
  Filled 2023-12-12 – 2023-12-22 (×3): qty 2, 28d supply, fill #0
  Filled 2024-01-18: qty 2, 28d supply, fill #1
  Filled 2024-02-21: qty 2, 28d supply, fill #2

## 2023-12-15 ENCOUNTER — Other Ambulatory Visit: Payer: Self-pay

## 2023-12-20 ENCOUNTER — Other Ambulatory Visit (HOSPITAL_COMMUNITY): Payer: Self-pay

## 2023-12-23 ENCOUNTER — Other Ambulatory Visit: Payer: Self-pay

## 2023-12-26 ENCOUNTER — Encounter: Payer: Self-pay | Admitting: Internal Medicine

## 2023-12-26 ENCOUNTER — Other Ambulatory Visit (HOSPITAL_COMMUNITY): Payer: Self-pay

## 2023-12-27 ENCOUNTER — Other Ambulatory Visit (HOSPITAL_COMMUNITY): Payer: Self-pay

## 2023-12-27 ENCOUNTER — Other Ambulatory Visit: Payer: Self-pay

## 2023-12-27 MED ORDER — INSULIN ASPART 100 UNIT/ML IJ SOLN
70.0000 [IU] | Freq: Every day | INTRAMUSCULAR | 2 refills | Status: DC
  Filled 2023-12-27: qty 60, 85d supply, fill #0
  Filled 2024-03-17: qty 60, 85d supply, fill #1
  Filled 2024-06-11: qty 60, 85d supply, fill #2

## 2023-12-28 ENCOUNTER — Other Ambulatory Visit (HOSPITAL_COMMUNITY): Payer: Self-pay

## 2023-12-28 ENCOUNTER — Other Ambulatory Visit: Payer: Self-pay

## 2024-01-12 ENCOUNTER — Encounter: Payer: Self-pay | Admitting: Internal Medicine

## 2024-01-18 ENCOUNTER — Other Ambulatory Visit (HOSPITAL_COMMUNITY): Payer: Self-pay

## 2024-01-18 ENCOUNTER — Other Ambulatory Visit: Payer: Self-pay | Admitting: Internal Medicine

## 2024-01-18 ENCOUNTER — Other Ambulatory Visit: Payer: Self-pay

## 2024-01-18 DIAGNOSIS — E1169 Type 2 diabetes mellitus with other specified complication: Secondary | ICD-10-CM

## 2024-01-18 MED ORDER — ATORVASTATIN CALCIUM 40 MG PO TABS
40.0000 mg | ORAL_TABLET | Freq: Every day | ORAL | 1 refills | Status: DC
Start: 2024-01-18 — End: 2024-08-01
  Filled 2024-01-18: qty 90, 90d supply, fill #0
  Filled 2024-05-09: qty 90, 90d supply, fill #1

## 2024-02-01 ENCOUNTER — Telehealth: Payer: Self-pay

## 2024-02-01 NOTE — Telephone Encounter (Signed)
 Copied from CRM [id number]. Topic: General - Other >> Feb 01, 2024  9:40 AM Bambi Bonine D wrote: Reason for CRM: Gurney Lefort with [employer] is calling regarding an order for a pump for the patient. Gurney Lefort stated that they have not received the signed orders back and needs them in order to start. Gurney Lefort provided a fax number [phone].

## 2024-02-02 ENCOUNTER — Encounter: Payer: Self-pay | Admitting: Internal Medicine

## 2024-02-06 NOTE — Telephone Encounter (Signed)
Form faxed and confirmed

## 2024-02-10 ENCOUNTER — Other Ambulatory Visit (HOSPITAL_COMMUNITY): Payer: Self-pay

## 2024-02-10 ENCOUNTER — Other Ambulatory Visit: Payer: Self-pay

## 2024-02-21 ENCOUNTER — Other Ambulatory Visit: Payer: Self-pay

## 2024-02-21 ENCOUNTER — Encounter: Payer: Self-pay | Admitting: Pharmacist

## 2024-02-21 ENCOUNTER — Other Ambulatory Visit: Payer: Self-pay | Admitting: Internal Medicine

## 2024-02-21 ENCOUNTER — Other Ambulatory Visit (HOSPITAL_COMMUNITY): Payer: Self-pay

## 2024-02-21 DIAGNOSIS — L709 Acne, unspecified: Secondary | ICD-10-CM

## 2024-02-21 MED ORDER — SPIRONOLACTONE 100 MG PO TABS
100.0000 mg | ORAL_TABLET | Freq: Every day | ORAL | 0 refills | Status: DC
  Filled 2024-02-21: qty 90, 90d supply, fill #0

## 2024-02-21 MED ORDER — FREESTYLE LIBRE 3 PLUS SENSOR MISC
6 refills | Status: DC
  Filled 2024-02-21: qty 2, 30d supply, fill #0
  Filled 2024-03-17: qty 2, 30d supply, fill #1

## 2024-03-19 ENCOUNTER — Other Ambulatory Visit (HOSPITAL_COMMUNITY): Payer: Self-pay

## 2024-03-20 ENCOUNTER — Other Ambulatory Visit: Payer: Self-pay

## 2024-03-24 ENCOUNTER — Encounter: Payer: Self-pay | Admitting: Internal Medicine

## 2024-03-26 ENCOUNTER — Other Ambulatory Visit (HOSPITAL_COMMUNITY): Payer: Self-pay

## 2024-03-26 ENCOUNTER — Other Ambulatory Visit: Payer: Self-pay

## 2024-03-26 MED ORDER — FREESTYLE LIBRE 3 PLUS SENSOR MISC
6 refills | Status: DC
  Filled 2024-03-26: qty 2, fill #0
  Filled 2024-04-16: qty 2, 30d supply, fill #0
  Filled 2024-05-16: qty 2, 28d supply, fill #1
  Filled 2024-06-11: qty 2, 28d supply, fill #2
  Filled 2024-07-08: qty 2, 28d supply, fill #3
  Filled 2024-08-04: qty 2, 28d supply, fill #4
  Filled 2024-08-31: qty 2, 30d supply, fill #5
  Filled 2024-09-29: qty 2, 30d supply, fill #6

## 2024-04-16 ENCOUNTER — Other Ambulatory Visit: Payer: Self-pay

## 2024-04-16 ENCOUNTER — Other Ambulatory Visit (HOSPITAL_COMMUNITY): Payer: Self-pay

## 2024-05-09 ENCOUNTER — Other Ambulatory Visit (HOSPITAL_COMMUNITY): Payer: Self-pay

## 2024-05-09 ENCOUNTER — Other Ambulatory Visit: Payer: Self-pay

## 2024-05-17 ENCOUNTER — Other Ambulatory Visit (HOSPITAL_COMMUNITY): Payer: Self-pay

## 2024-06-11 ENCOUNTER — Other Ambulatory Visit: Payer: Self-pay

## 2024-06-11 ENCOUNTER — Other Ambulatory Visit: Payer: Self-pay | Admitting: Internal Medicine

## 2024-06-11 DIAGNOSIS — L709 Acne, unspecified: Secondary | ICD-10-CM

## 2024-06-12 ENCOUNTER — Other Ambulatory Visit (HOSPITAL_COMMUNITY): Payer: Self-pay

## 2024-06-12 ENCOUNTER — Other Ambulatory Visit: Payer: Self-pay

## 2024-06-12 MED ORDER — SPIRONOLACTONE 100 MG PO TABS
100.0000 mg | ORAL_TABLET | Freq: Every day | ORAL | 1 refills | Status: AC
  Filled 2024-06-12: qty 90, 90d supply, fill #0
  Filled 2024-09-06: qty 90, 90d supply, fill #1

## 2024-07-09 ENCOUNTER — Other Ambulatory Visit (HOSPITAL_COMMUNITY): Payer: Self-pay

## 2024-08-01 ENCOUNTER — Other Ambulatory Visit: Payer: Self-pay

## 2024-08-01 ENCOUNTER — Other Ambulatory Visit (HOSPITAL_COMMUNITY): Payer: Self-pay

## 2024-08-01 ENCOUNTER — Other Ambulatory Visit: Payer: Self-pay | Admitting: Internal Medicine

## 2024-08-01 DIAGNOSIS — E039 Hypothyroidism, unspecified: Secondary | ICD-10-CM

## 2024-08-01 DIAGNOSIS — E1169 Type 2 diabetes mellitus with other specified complication: Secondary | ICD-10-CM

## 2024-08-01 MED ORDER — NP THYROID 60 MG PO TABS
180.0000 mg | ORAL_TABLET | Freq: Every day | ORAL | 1 refills | Status: AC
  Filled 2024-08-01: qty 270, 90d supply, fill #0
  Filled 2024-11-01: qty 270, 90d supply, fill #1

## 2024-08-01 MED ORDER — ATORVASTATIN CALCIUM 40 MG PO TABS
40.0000 mg | ORAL_TABLET | Freq: Every day | ORAL | 1 refills | Status: AC
  Filled 2024-08-01: qty 90, 90d supply, fill #0
  Filled 2024-10-31: qty 90, 90d supply, fill #1

## 2024-08-04 ENCOUNTER — Other Ambulatory Visit (HOSPITAL_COMMUNITY): Payer: Self-pay

## 2024-08-31 ENCOUNTER — Other Ambulatory Visit: Payer: Self-pay | Admitting: Internal Medicine

## 2024-09-03 ENCOUNTER — Other Ambulatory Visit (HOSPITAL_COMMUNITY): Payer: Self-pay

## 2024-09-03 ENCOUNTER — Other Ambulatory Visit: Payer: Self-pay

## 2024-09-03 MED ORDER — INSULIN ASPART 100 UNIT/ML IJ SOLN
70.0000 [IU] | Freq: Every day | INTRAMUSCULAR | 2 refills | Status: AC
  Filled 2024-09-03: qty 60, 85d supply, fill #0

## 2024-09-04 ENCOUNTER — Other Ambulatory Visit: Payer: Self-pay

## 2024-09-07 ENCOUNTER — Other Ambulatory Visit (HOSPITAL_COMMUNITY): Payer: Self-pay

## 2024-09-23 ENCOUNTER — Encounter: Payer: Self-pay | Admitting: Internal Medicine

## 2024-10-02 ENCOUNTER — Other Ambulatory Visit: Payer: Self-pay

## 2024-10-31 ENCOUNTER — Other Ambulatory Visit (HOSPITAL_COMMUNITY): Payer: Self-pay

## 2024-10-31 ENCOUNTER — Other Ambulatory Visit: Payer: Self-pay | Admitting: Internal Medicine

## 2024-10-31 ENCOUNTER — Encounter (HOSPITAL_COMMUNITY): Payer: Self-pay | Admitting: Pharmacist

## 2024-10-31 ENCOUNTER — Other Ambulatory Visit: Payer: Self-pay

## 2024-10-31 MED ORDER — FREESTYLE LIBRE 3 PLUS SENSOR MISC
2 refills | Status: AC
  Filled 2024-10-31: qty 2, 30d supply, fill #0

## 2024-11-01 ENCOUNTER — Other Ambulatory Visit (HOSPITAL_COMMUNITY): Payer: Self-pay

## 2024-11-02 ENCOUNTER — Other Ambulatory Visit (HOSPITAL_COMMUNITY): Payer: Self-pay

## 2024-11-08 ENCOUNTER — Telehealth: Payer: Self-pay | Admitting: *Deleted

## 2024-11-08 NOTE — Telephone Encounter (Signed)
 Office note was faxed and efaxed.

## 2024-11-08 NOTE — Telephone Encounter (Signed)
 Copied from CRM [id number]. Topic: Clinical - Order For Equipment >> Nov 08, 2024 12:02 PM Berneda FALCON wrote: Reason for CRM: Dianna from [employer] is requesting office visit notes for the insulin  pump and CGM supplies.  [phone] Fax-[phone]
